# Patient Record
Sex: Male | Born: 1945
Health system: Southern US, Community
[De-identification: ages and names within clinical notes are randomized; demographics above are authoritative.]

## PROBLEM LIST (undated history)

## (undated) DIAGNOSIS — F32A Depression, unspecified: Secondary | ICD-10-CM

## (undated) DIAGNOSIS — K5790 Diverticulosis of intestine, part unspecified, without perforation or abscess without bleeding: Secondary | ICD-10-CM

## (undated) DIAGNOSIS — I6529 Occlusion and stenosis of unspecified carotid artery: Secondary | ICD-10-CM

## (undated) DIAGNOSIS — F329 Major depressive disorder, single episode, unspecified: Secondary | ICD-10-CM

## (undated) DIAGNOSIS — Z9081 Acquired absence of spleen: Secondary | ICD-10-CM

## (undated) DIAGNOSIS — N200 Calculus of kidney: Secondary | ICD-10-CM

## (undated) DIAGNOSIS — Z972 Presence of dental prosthetic device (complete) (partial): Secondary | ICD-10-CM

## (undated) DIAGNOSIS — S68119A Complete traumatic metacarpophalangeal amputation of unspecified finger, initial encounter: Secondary | ICD-10-CM

## (undated) DIAGNOSIS — M4800 Spinal stenosis, site unspecified: Secondary | ICD-10-CM

## (undated) DIAGNOSIS — M48061 Spinal stenosis, lumbar region without neurogenic claudication: Secondary | ICD-10-CM

## (undated) DIAGNOSIS — C801 Malignant (primary) neoplasm, unspecified: Secondary | ICD-10-CM

## (undated) DIAGNOSIS — H269 Unspecified cataract: Secondary | ICD-10-CM

## (undated) DIAGNOSIS — N529 Male erectile dysfunction, unspecified: Secondary | ICD-10-CM

## (undated) DIAGNOSIS — E785 Hyperlipidemia, unspecified: Secondary | ICD-10-CM

## (undated) DIAGNOSIS — K259 Gastric ulcer, unspecified as acute or chronic, without hemorrhage or perforation: Secondary | ICD-10-CM

## (undated) DIAGNOSIS — I700 Atherosclerosis of aorta: Secondary | ICD-10-CM

## (undated) DIAGNOSIS — Z87442 Personal history of urinary calculi: Secondary | ICD-10-CM

## (undated) DIAGNOSIS — K219 Gastro-esophageal reflux disease without esophagitis: Secondary | ICD-10-CM

## (undated) DIAGNOSIS — M1990 Unspecified osteoarthritis, unspecified site: Secondary | ICD-10-CM

## (undated) DIAGNOSIS — N393 Stress incontinence (female) (male): Secondary | ICD-10-CM

## (undated) DIAGNOSIS — K760 Fatty (change of) liver, not elsewhere classified: Secondary | ICD-10-CM

## (undated) HISTORY — DX: Calculus of kidney: N20.0

## (undated) HISTORY — DX: Atherosclerosis of aorta: I70.0

## (undated) HISTORY — PX: REPAIR OF PERFORATED ULCER: SHX6065

## (undated) HISTORY — DX: Diverticulosis of intestine, part unspecified, without perforation or abscess without bleeding: K57.90

## (undated) HISTORY — DX: Malignant (primary) neoplasm, unspecified: C80.1

## (undated) HISTORY — DX: Gastric ulcer, unspecified as acute or chronic, without hemorrhage or perforation: K25.9

## (undated) HISTORY — DX: Male erectile dysfunction, unspecified: N52.9

## (undated) HISTORY — DX: Stress incontinence (female) (male): N39.3

## (undated) HISTORY — DX: Spinal stenosis, lumbar region without neurogenic claudication: M48.061

## (undated) HISTORY — DX: Gastro-esophageal reflux disease without esophagitis: K21.9

## (undated) HISTORY — DX: Complete traumatic metacarpophalangeal amputation of unspecified finger, initial encounter: S68.119A

## (undated) HISTORY — DX: Occlusion and stenosis of unspecified carotid artery: I65.29

## (undated) HISTORY — DX: Acquired absence of spleen: Z90.81

## (undated) HISTORY — DX: Major depressive disorder, single episode, unspecified: F32.9

## (undated) HISTORY — DX: Depression, unspecified: F32.A

## (undated) HISTORY — DX: Hyperlipidemia, unspecified: E78.5

## (undated) HISTORY — PX: INCONTINENCE SURGERY: SHX676

## (undated) HISTORY — DX: Unspecified osteoarthritis, unspecified site: M19.90

## (undated) HISTORY — PX: COLONOSCOPY: SHX5424

## (undated) HISTORY — DX: Spinal stenosis, site unspecified: M48.00

## (undated) HISTORY — PX: VENTRAL HERNIA REPAIR: SHX424

## (undated) HISTORY — DX: Unspecified cataract: H26.9

## (undated) HISTORY — DX: Fatty (change of) liver, not elsewhere classified: K76.0

---
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
## 1967-04-13 HISTORY — PX: KNEE ARTHROSCOPY: SUR90

## 2004-04-12 DIAGNOSIS — C801 Malignant (primary) neoplasm, unspecified: Secondary | ICD-10-CM

## 2004-04-12 HISTORY — DX: Malignant (primary) neoplasm, unspecified: C80.1

## 2005-04-12 HISTORY — PX: PROSTATECTOMY: SHX69

## 2011-08-19 LAB — HM COLONOSCOPY

## 2011-08-30 LAB — HM COLONOSCOPY: HM Colonoscopy: NORMAL

## 2014-06-05 ENCOUNTER — Ambulatory Visit (INDEPENDENT_AMBULATORY_CARE_PROVIDER_SITE_OTHER): Payer: Self-pay | Admitting: Internal Medicine

## 2014-06-05 ENCOUNTER — Encounter (INDEPENDENT_AMBULATORY_CARE_PROVIDER_SITE_OTHER): Payer: Self-pay

## 2014-06-05 ENCOUNTER — Encounter: Payer: Self-pay | Admitting: Internal Medicine

## 2014-06-05 VITALS — BP 146/81 | HR 68 | Temp 97.6°F | Ht 66.5 in | Wt 200.5 lb

## 2014-06-05 DIAGNOSIS — E785 Hyperlipidemia, unspecified: Secondary | ICD-10-CM | POA: Insufficient documentation

## 2014-06-05 DIAGNOSIS — Z9081 Acquired absence of spleen: Secondary | ICD-10-CM | POA: Insufficient documentation

## 2014-06-05 DIAGNOSIS — L309 Dermatitis, unspecified: Secondary | ICD-10-CM

## 2014-06-05 DIAGNOSIS — Z8546 Personal history of malignant neoplasm of prostate: Secondary | ICD-10-CM | POA: Insufficient documentation

## 2014-06-05 DIAGNOSIS — Z8619 Personal history of other infectious and parasitic diseases: Secondary | ICD-10-CM | POA: Insufficient documentation

## 2014-06-05 DIAGNOSIS — R42 Dizziness and giddiness: Secondary | ICD-10-CM

## 2014-06-05 HISTORY — DX: Personal history of malignant neoplasm of prostate: Z85.46

## 2014-06-05 HISTORY — DX: Personal history of other infectious and parasitic diseases: Z86.19

## 2014-06-05 LAB — CBC WITH DIFFERENTIAL/PLATELET
BASOS PCT: 0.4 % (ref 0.0–3.0)
Basophils Absolute: 0 10*3/uL (ref 0.0–0.1)
EOS ABS: 0.2 10*3/uL (ref 0.0–0.7)
Eosinophils Relative: 2.7 % (ref 0.0–5.0)
HCT: 44.1 % (ref 39.0–52.0)
Hemoglobin: 14.8 g/dL (ref 13.0–17.0)
LYMPHS ABS: 1.9 10*3/uL (ref 0.7–4.0)
Lymphocytes Relative: 25.5 % (ref 12.0–46.0)
MCHC: 33.5 g/dL (ref 30.0–36.0)
MCV: 89.3 fl (ref 78.0–100.0)
MONO ABS: 0.5 10*3/uL (ref 0.1–1.0)
Monocytes Relative: 6.2 % (ref 3.0–12.0)
NEUTROS PCT: 65.2 % (ref 43.0–77.0)
Neutro Abs: 4.8 10*3/uL (ref 1.4–7.7)
PLATELETS: 244 10*3/uL (ref 150.0–400.0)
RBC: 4.93 Mil/uL (ref 4.22–5.81)
RDW: 13.6 % (ref 11.5–15.5)
WBC: 7.3 10*3/uL (ref 4.0–10.5)

## 2014-06-05 LAB — COMPREHENSIVE METABOLIC PANEL
ALT: 14 U/L (ref 0–53)
AST: 18 U/L (ref 0–37)
Albumin: 4.3 g/dL (ref 3.5–5.2)
Alkaline Phosphatase: 92 U/L (ref 39–117)
BILIRUBIN TOTAL: 0.9 mg/dL (ref 0.2–1.2)
BUN: 17 mg/dL (ref 6–23)
CALCIUM: 9.7 mg/dL (ref 8.4–10.5)
CHLORIDE: 106 meq/L (ref 96–112)
CO2: 27 mEq/L (ref 19–32)
CREATININE: 0.77 mg/dL (ref 0.40–1.50)
GFR: 106.43 mL/min (ref >60.00)
GLUCOSE: 91 mg/dL (ref 70–99)
Potassium: 4.4 mEq/L (ref 3.5–5.1)
Sodium: 137 mEq/L (ref 135–145)
TOTAL PROTEIN: 7.1 g/dL (ref 6.0–8.3)

## 2014-06-05 LAB — LIPID PANEL
CHOL/HDL RATIO: 4
Cholesterol: 181 mg/dL (ref 0–200)
HDL: 49.4 mg/dL (ref >39.00)
LDL Cholesterol: 115 mg/dL — ABNORMAL HIGH (ref 0–99)
NonHDL: 131.6
Triglycerides: 81 mg/dL (ref 0.0–149.0)
VLDL: 16.2 mg/dL (ref 0.0–40.0)

## 2014-06-05 LAB — TSH: TSH: 0.69 u[IU]/mL (ref 0.35–4.50)

## 2014-06-05 LAB — VITAMIN B12: Vitamin B-12: 524 pg/mL (ref 211–911)

## 2014-06-05 NOTE — Assessment & Plan Note (Signed)
Will request records from previous urologist. Will set up evaluation with [facility] for ongoing care.

## 2014-06-05 NOTE — Progress Notes (Signed)
Subjective:    Patient ID: Benjamin Mills, male    DOB: Sep 17, 1945, 69 y.o.   MRN: [id number]  HPI  69YO male presents to establish care. Recently moved from [place].  Prostate cancer - Followed by urology previous in [place]. S/p prostatectomy and bladder sling. No current concerns.  Last year, developed lightheadedness. Evaluated and started on Meclizine. Continues to have lightheadedness. Occasionally, loses balance because of lightheadedness.  Described as "numbness."  No chest pain. Occasional shortness of breath.  Hyperlipidemia - Prefers to be off medication. Stopped Simvastatin about 2 weeks ago. Would like to stay off if possible.  Past medical, surgical, family and social history per today's encounter.  Review of Systems  Constitutional: Negative for fever, chills, activity change, appetite change, fatigue and unexpected weight change.  Eyes: Negative for visual disturbance.  Respiratory: Positive for shortness of breath. Negative for cough and chest tightness.   Cardiovascular: Negative for chest pain, palpitations and leg swelling.  Gastrointestinal: Negative for nausea, vomiting, abdominal pain, diarrhea, constipation and abdominal distention.  Genitourinary: Negative for dysuria, urgency and difficulty urinating.  Musculoskeletal: Negative for myalgias, arthralgias, gait problem and neck stiffness.  Skin: Negative for color change and rash.  Neurological: Positive for dizziness and light-headedness. Negative for tremors, syncope, weakness, numbness and headaches.  Hematological: Negative for adenopathy.  Psychiatric/Behavioral: Negative for sleep disturbance and dysphoric mood. The patient is not nervous/anxious.        Objective:    BP 146/81 mmHg  Pulse 68  Temp(Src) 97.6 F (36.4 C) (Oral)  Ht 5' 6.5" (1.689 m)  Wt 200 lb 8 oz (90.946 kg)  BMI 31.88 kg/m2  SpO2 97% Physical Exam  Constitutional: He is oriented to person, place, and time. He appears  well-developed and well-nourished. No distress.  HENT:  Head: Normocephalic and atraumatic.  Right Ear: External ear normal.  Left Ear: External ear normal.  Nose: Nose normal.  Mouth/Throat: Oropharynx is clear and moist. No oropharyngeal exudate.  Eyes: Conjunctivae and EOM are normal. Pupils are equal, round, and reactive to light. Right eye exhibits no discharge. Left eye exhibits no discharge. No scleral icterus.  Neck: Normal range of motion. Neck supple. No tracheal deviation present. No thyromegaly present.  Cardiovascular: Normal rate, regular rhythm and normal heart sounds.  Exam reveals no gallop and no friction rub.   No murmur heard. Pulmonary/Chest: Effort normal and breath sounds normal. No respiratory distress. He has no wheezes. He has no rales. He exhibits no tenderness.  Abdominal: Soft. Bowel sounds are normal. He exhibits no distension and no mass. There is no tenderness. There is no rebound and no guarding.    Musculoskeletal: Normal range of motion. He exhibits no edema.  Lymphadenopathy:    He has no cervical adenopathy.  Neurological: He is alert and oriented to person, place, and time. No cranial nerve deficit. Coordination normal.  Skin: Skin is warm and dry. No rash noted. He is not diaphoretic. No erythema. No pallor.  Psychiatric: He has a normal mood and affect. His behavior is normal. Judgment and thought content normal.          Assessment & Plan:   Problem List Items Addressed This Visit      Unprioritized   Dermatitis    Chronic dermatitis over right anterior lower leg. Using Betamethasone with improvement. Will continue.      H/O cold sores    Recurrent cold sores. Will continue prn Valtrex.      History of prostate cancer  Will request records from previous urologist. Will set up evaluation with [facility] for ongoing care.      Relevant Orders   Ambulatory referral to Urology   History of splenectomy    S/p splenectomy.  Will request records from previous PCP regarding vaccinations.      Relevant Orders   CBC with Differential/Platelet   Hyperlipidemia    Will check lipids and LFTs with labs. He has been off Simvastatin for 2 weeks. Prefers not to be on medications if possible. Discussed Mediterranean style diet and exercise.      Relevant Medications   sildenafil (REVATIO) 20 MG tablet   Other Relevant Orders   Comprehensive metabolic panel   Lipid panel   Lightheadedness - Primary    Recent episodes of lightheadedness with mild dyspnea. Exam is normal today. EKG shows no acute changes. Will check labs including CBC, CMP, TSH, B12. Will set up cardiology evaluation for possible stress test and carotid dopplers. Follow up in 4 weeks and prn.      Relevant Orders   Ambulatory referral to Cardiology   TSH   B12   EKG 12-Lead (Completed)       Return in about 4 weeks (around 07/03/2014).

## 2014-06-05 NOTE — Patient Instructions (Signed)
It was nice to meet you!  Labs and EKG today.  We will set up an evaluation with cardiology to include carotid dopplers and possible stress test.  Follow up in 4 weeks.

## 2014-06-05 NOTE — Assessment & Plan Note (Signed)
Recent episodes of lightheadedness with mild dyspnea. Exam is normal today. EKG shows no acute changes. Will check labs including CBC, CMP, TSH, B12. Will set up cardiology evaluation for possible stress test and carotid dopplers. Follow up in 4 weeks and prn.

## 2014-06-05 NOTE — Assessment & Plan Note (Signed)
S/p splenectomy. Will request records from previous PCP regarding vaccinations.

## 2014-06-05 NOTE — Assessment & Plan Note (Signed)
Recurrent cold sores. Will continue prn Valtrex.

## 2014-06-05 NOTE — Assessment & Plan Note (Signed)
Chronic dermatitis over right anterior lower leg. Using Betamethasone with improvement. Will continue.

## 2014-06-05 NOTE — Assessment & Plan Note (Signed)
Will check lipids and LFTs with labs. He has been off Simvastatin for 2 weeks. Prefers not to be on medications if possible. Discussed Mediterranean style diet and exercise.

## 2014-06-10 ENCOUNTER — Encounter: Payer: Self-pay | Admitting: Internal Medicine

## 2014-06-21 ENCOUNTER — Encounter (INDEPENDENT_AMBULATORY_CARE_PROVIDER_SITE_OTHER): Payer: PPO

## 2014-06-21 ENCOUNTER — Ambulatory Visit (INDEPENDENT_AMBULATORY_CARE_PROVIDER_SITE_OTHER): Payer: PPO | Admitting: Cardiovascular Disease

## 2014-06-21 ENCOUNTER — Other Ambulatory Visit: Payer: PPO

## 2014-06-21 ENCOUNTER — Encounter: Payer: Self-pay | Admitting: Cardiovascular Disease

## 2014-06-21 VITALS — BP 132/87 | HR 68 | Ht 68.0 in | Wt 201.0 lb

## 2014-06-21 DIAGNOSIS — R079 Chest pain, unspecified: Secondary | ICD-10-CM

## 2014-06-21 DIAGNOSIS — R9431 Abnormal electrocardiogram [ECG] [EKG]: Secondary | ICD-10-CM

## 2014-06-21 DIAGNOSIS — I6523 Occlusion and stenosis of bilateral carotid arteries: Secondary | ICD-10-CM

## 2014-06-21 DIAGNOSIS — R42 Dizziness and giddiness: Secondary | ICD-10-CM

## 2014-06-21 NOTE — Patient Instructions (Signed)
Your physician has requested that you have a carotid duplex. This test is an ultrasound of the carotid arteries in your neck. It looks at blood flow through these arteries that supply the brain with blood. Allow one hour for this exam. There are no restrictions or special instructions.   Your physician has requested that you have a stress echocardiogram. For further information please visit [email or web address]. Please follow instruction sheet as given. -Eat a small meal  -take meds -wear comfortable cloths and lace up walking shoes  -no lotion on the skin   Your physician recommends that you schedule a follow-up appointment in:  As needed

## 2014-06-21 NOTE — Assessment & Plan Note (Addendum)
Most of his symptoms seem to be due to orthostatic hypotension. He is borderline orthostatic today by vital signs. He also had few episodes while he was vacuuming. I requested carotid Doppler to evaluate for possible steal syndrome. I advised him to avoid sudden standing up and to stay well-hydrated.  If cardiac evaluation is negative, consider ENT consult given the associated tinnitus.

## 2014-06-21 NOTE — Progress Notes (Signed)
Primary care physician: Dr. Gilford Rile  HPI  This is a pleasant 69 year old male who was referred by Dr. Gilford Rile for evaluation of dizziness and shortness of breath. He has no previous cardiac history. He recently moved from [place]. Overall, he has been healthy throughout his life and has no history of hypertension, diabetes or tobacco use. There is no family history of coronary artery disease. He has been complaining of dizziness mostly upon standing up or changing position. This started about one year ago and has been getting worse to the point of presyncope. He has not had any full syncopal episodes. He occasionally has ringing noise in both years associated with these episodes. He had few episodes of left-sided chest pain when working and they are which he attributed to a musculoskeletal etiology. He also describes chronic exertional dyspnea with slight worsening over the last 6 months.  No Known Allergies   Current Outpatient Prescriptions on File Prior to Visit  Medication Sig Dispense Refill  . betamethasone dipropionate (DIPROLENE) 0.05 % cream Apply topically as needed.     . meclizine (ANTIVERT) 25 MG tablet Take 25 mg by mouth 3 (three) times daily as needed for dizziness.    . Multiple Vitamin (MULTIVITAMIN) capsule Take 1 capsule by mouth daily.    . sildenafil (REVATIO) 20 MG tablet Take 20 mg by mouth 3 (three) times daily as needed.    . valACYclovir (VALTREX) 1000 MG tablet Take 1,000 mg by mouth as needed.      No current facility-administered medications on file prior to visit.     Past Medical History  Diagnosis Date  . Cancer     Prostate cancer   . History of splenectomy   . Hyperlipidemia   . Gastric ulcer      Past Surgical History  Procedure Laterality Date  . Splenectomy  1953    after trauma, fell out tree  . Knee arthroscopy N/A 1969    meniscal tear  . Prostatectomy  2007    removal of prostate  . Ventral hernia repair    . Incontinence surgery         Family History  Problem Relation Age of Onset  . Dementia Mother   . Alcohol abuse Father   . Glaucoma Sister   . Heart disease Brother     heart valve issue     History   Social History  . Marital Status: Unknown    Spouse Name: N/A  . Number of Children: N/A  . Years of Education: N/A   Occupational History  . Not on file.   Social History Main Topics  . Smoking status: Former Smoker    Quit date: 06/05/1978  . Smokeless tobacco: Not on file  . Alcohol Use: No  . Drug Use: No  . Sexual Activity: Not on file   Other Topics Concern  . Not on file   Social History Narrative   Born in [place].   Lived in [place].   Moved to [place], brother lives in [place].      Has 2 brothers and 1 sister.      Lives with wife in [place]. Has 3 daughters.      Work - retired, Surveyor, quantity      Diet - regular      Exercise - walks occasionally     ROS A 10 point review of system was performed. It is negative other than that mentioned in the history of present illness.  PHYSICAL EXAM   BP 132/87 mmHg  Pulse 68  Ht 5\' 8"  (1.727 m)  Wt 201 lb (91.173 kg)  BMI 30.57 kg/m2 Constitutional: He is oriented to person, place, and time. He appears well-developed and well-nourished. No distress.  HENT: No nasal discharge.  Head: Normocephalic and atraumatic.  Eyes: Pupils are equal and round.  No discharge. Neck: Normal range of motion. Neck supple. No JVD present. No thyromegaly present.  Cardiovascular: Normal rate, regular rhythm, normal heart sounds. Exam reveals no gallop and no friction rub. No murmur heard.  Pulmonary/Chest: Effort normal and breath sounds normal. No stridor. No respiratory distress. He has no wheezes. He has no rales. He exhibits no tenderness.  Abdominal: Soft. Bowel sounds are normal. He exhibits no distension. There is no tenderness. There is no rebound and no guarding.  Musculoskeletal: Normal range of motion. He exhibits no edema and no  tenderness.  Neurological: He is alert and oriented to person, place, and time. Coordination normal.  Skin: Skin is warm and dry. No rash noted. He is not diaphoretic. No erythema. No pallor.  Psychiatric: He has a normal mood and affect. His behavior is normal. Judgment and thought content normal.       EKG: Sinus  Rhythm  Diffuse low voltage.   -Incomplete right bundle branch block.   ABNORMAL     ASSESSMENT AND PLAN

## 2014-06-21 NOTE — Assessment & Plan Note (Signed)
The patient had few episodes of left-sided chest pain while working in the yard. He also complains of increased exertional dyspnea and exertional dizziness. I recommend evaluation with a stress echocardiogram.

## 2014-07-03 ENCOUNTER — Encounter: Payer: Self-pay | Admitting: Internal Medicine

## 2014-07-03 ENCOUNTER — Ambulatory Visit (INDEPENDENT_AMBULATORY_CARE_PROVIDER_SITE_OTHER): Payer: PPO | Admitting: Internal Medicine

## 2014-07-03 VITALS — BP 119/78 | HR 65 | Temp 97.7°F | Ht 66.5 in | Wt 201.5 lb

## 2014-07-03 DIAGNOSIS — Z8546 Personal history of malignant neoplasm of prostate: Secondary | ICD-10-CM | POA: Diagnosis not present

## 2014-07-03 DIAGNOSIS — R300 Dysuria: Secondary | ICD-10-CM

## 2014-07-03 DIAGNOSIS — R42 Dizziness and giddiness: Secondary | ICD-10-CM | POA: Diagnosis not present

## 2014-07-03 LAB — POCT URINALYSIS DIPSTICK
Bilirubin, UA: NEGATIVE
Glucose, UA: NEGATIVE
Ketones, UA: NEGATIVE
LEUKOCYTES UA: NEGATIVE
NITRITE UA: NEGATIVE
PH UA: 7
Protein, UA: NEGATIVE
SPEC GRAV UA: 1.02
UROBILINOGEN UA: 0.2

## 2014-07-03 LAB — PSA, MEDICARE: PSA: 0.11 ng/mL (ref 0.10–4.00)

## 2014-07-03 NOTE — Assessment & Plan Note (Signed)
Will check PSA with labs today. Urology evaluation pending.

## 2014-07-03 NOTE — Assessment & Plan Note (Signed)
Recent episode of dysuria and passing a piece of plastic material. Will try to expedite eval with urology. Question if this piece of plastic may have been from previous bladder sling.

## 2014-07-03 NOTE — Addendum Note (Signed)
Addended by: Johnsie Cancel on: 07/03/2014 02:33 PM   Modules accepted: Orders

## 2014-07-03 NOTE — Assessment & Plan Note (Signed)
Symptoms of lightheadedness are persistent. Carotid dopplers showed mild plaque but no significant blockage. Labs were normal. Will await results of cardiac stress test. If normal, will consider ENT evaluation.

## 2014-07-03 NOTE — Patient Instructions (Signed)
Labs today.  Follow up after stress test.

## 2014-07-03 NOTE — Progress Notes (Signed)
Subjective:    Patient ID: Benjamin Mills, male    DOB: 1946-01-12, 69 y.o.   MRN: [id number]  HPI 69YO male presents for follow up.  Last seen 2/24. Evaluated for lightheadedness. Seen by Cardiology 3/11.   Continues to have some lightheadedness off and on.  Has some dyspnea with exertion, but no chest pain.  Dysuria - had burning with urination last week, then piece of plastic came out of urethra. Symptoms have resolved after passing plastic.  Past medical, surgical, family and social history per today's encounter.  Review of Systems  Constitutional: Negative for fever, chills, activity change, appetite change, fatigue and unexpected weight change.  Eyes: Negative for visual disturbance.  Respiratory: Positive for shortness of breath. Negative for cough.   Cardiovascular: Negative for chest pain, palpitations and leg swelling.  Gastrointestinal: Negative for abdominal pain, diarrhea, constipation and abdominal distention.  Genitourinary: Positive for dysuria, discharge (plastic piece of material) and penile pain. Negative for urgency, hematuria and difficulty urinating.  Musculoskeletal: Negative for arthralgias and gait problem.  Skin: Negative for color change and rash.  Neurological: Positive for light-headedness. Negative for seizures, facial asymmetry and weakness.  Hematological: Negative for adenopathy.  Psychiatric/Behavioral: Negative for sleep disturbance and dysphoric mood. The patient is not nervous/anxious.        Objective:    BP 119/78 mmHg  Pulse 65  Temp(Src) 97.7 F (36.5 C) (Oral)  Ht 5' 6.5" (1.689 m)  Wt 201 lb 8 oz (91.4 kg)  BMI 32.04 kg/m2  SpO2 99% Physical Exam  Constitutional: He is oriented to person, place, and time. He appears well-developed and well-nourished. No distress.  HENT:  Head: Normocephalic and atraumatic.  Right Ear: External ear normal.  Left Ear: External ear normal.  Nose: Nose normal.  Mouth/Throat: Oropharynx is  clear and moist. No oropharyngeal exudate.  Eyes: Conjunctivae and EOM are normal. Pupils are equal, round, and reactive to light. Right eye exhibits no discharge. Left eye exhibits no discharge. No scleral icterus.  Neck: Normal range of motion. Neck supple. No tracheal deviation present. No thyromegaly present.  Cardiovascular: Normal rate, regular rhythm and normal heart sounds.  Exam reveals no gallop and no friction rub.   No murmur heard. Pulmonary/Chest: Effort normal and breath sounds normal. No accessory muscle usage. No tachypnea. No respiratory distress. He has no decreased breath sounds. He has no wheezes. He has no rhonchi. He has no rales. He exhibits no tenderness.  Musculoskeletal: Normal range of motion. He exhibits no edema.  Lymphadenopathy:    He has no cervical adenopathy.  Neurological: He is alert and oriented to person, place, and time. No cranial nerve deficit. Coordination normal.  Skin: Skin is warm and dry. No rash noted. He is not diaphoretic. No erythema. No pallor.  Psychiatric: He has a normal mood and affect. His behavior is normal. Judgment and thought content normal.          Assessment & Plan:   Problem List Items Addressed This Visit      Unprioritized   Dysuria    Recent episode of dysuria and passing a piece of plastic material. Will try to expedite eval with urology. Question if this piece of plastic may have been from previous bladder sling.      Relevant Orders   POCT Urinalysis Dipstick   History of prostate cancer    Will check PSA with labs today. Urology evaluation pending.      Relevant Orders   PSA, Medicare  Lightheadedness - Primary    Symptoms of lightheadedness are persistent. Carotid dopplers showed mild plaque but no significant blockage. Labs were normal. Will await results of cardiac stress test. If normal, will consider ENT evaluation.          Return in about 5 weeks (around 08/07/2014) for Recheck.

## 2014-07-03 NOTE — Progress Notes (Signed)
Pre visit review using our clinic review tool, if applicable. No additional management support is needed unless otherwise documented below in the visit note. 

## 2014-07-05 ENCOUNTER — Encounter: Payer: Self-pay | Admitting: Internal Medicine

## 2014-07-05 DIAGNOSIS — H9313 Tinnitus, bilateral: Secondary | ICD-10-CM

## 2014-07-05 LAB — CULTURE, URINE COMPREHENSIVE
COLONY COUNT: NO GROWTH
ORGANISM ID, BACTERIA: NO GROWTH

## 2014-07-10 ENCOUNTER — Encounter: Payer: Self-pay | Admitting: Internal Medicine

## 2014-07-25 ENCOUNTER — Other Ambulatory Visit: Payer: PPO

## 2014-07-26 ENCOUNTER — Encounter: Payer: Self-pay | Admitting: Internal Medicine

## 2014-07-29 ENCOUNTER — Ambulatory Visit (INDEPENDENT_AMBULATORY_CARE_PROVIDER_SITE_OTHER): Payer: PPO

## 2014-07-29 DIAGNOSIS — R079 Chest pain, unspecified: Secondary | ICD-10-CM

## 2014-07-29 DIAGNOSIS — R9431 Abnormal electrocardiogram [ECG] [EKG]: Secondary | ICD-10-CM

## 2014-07-30 ENCOUNTER — Telehealth: Payer: Self-pay

## 2014-07-30 NOTE — Telephone Encounter (Signed)
Pt would like stress test results.  

## 2014-07-31 ENCOUNTER — Telehealth: Payer: Self-pay | Admitting: *Deleted

## 2014-07-31 NOTE — Telephone Encounter (Signed)
Patient came by for stress test results.

## 2014-08-01 NOTE — Telephone Encounter (Signed)
Please make sure he has a 73min follow up visit in the next few weeks. Thanks

## 2014-08-01 NOTE — Telephone Encounter (Signed)
Reviewed results w/ pt.   He is appreciative and would like to make Dr. Gilford Rile aware so that she can proceed w/ additional testing.

## 2014-08-01 NOTE — Telephone Encounter (Signed)
Normal stress test. Not sure why the result was not routed to me.

## 2014-08-02 ENCOUNTER — Encounter: Payer: Self-pay | Admitting: Internal Medicine

## 2014-08-06 ENCOUNTER — Ambulatory Visit (INDEPENDENT_AMBULATORY_CARE_PROVIDER_SITE_OTHER): Payer: PPO | Admitting: Internal Medicine

## 2014-08-06 ENCOUNTER — Encounter: Payer: Self-pay | Admitting: Internal Medicine

## 2014-08-06 VITALS — BP 118/78 | HR 72 | Temp 97.9°F | Resp 14 | Ht 66.5 in | Wt 205.8 lb

## 2014-08-06 DIAGNOSIS — F4323 Adjustment disorder with mixed anxiety and depressed mood: Secondary | ICD-10-CM

## 2014-08-06 DIAGNOSIS — R42 Dizziness and giddiness: Secondary | ICD-10-CM

## 2014-08-06 HISTORY — DX: Adjustment disorder with mixed anxiety and depressed mood: F43.23

## 2014-08-06 MED ORDER — DULOXETINE HCL 30 MG PO CPEP: 30.0000 mg | ORAL_CAPSULE | Freq: Every day | ORAL | Status: DC

## 2014-08-06 NOTE — Progress Notes (Signed)
Pre visit review using our clinic review tool, if applicable. No additional management support is needed unless otherwise documented below in the visit note. 

## 2014-08-06 NOTE — Assessment & Plan Note (Signed)
Persistent lightheadedness with headache. Cardiology and ENT evaluation normal. Will set up MRI brain for further evaluation.

## 2014-08-06 NOTE — Progress Notes (Signed)
Subjective:    Patient ID: Benjamin Mills, male    DOB: 06-24-1945, 69 y.o.   MRN: [id number]  HPI  69YO male presents for follow up.  Dizziness - Had evaluation with cardiology, including stress test and carotid dopplers which were normal. Also seen by ENT and this was normal. Noted mild hearing loss.  Continues to have lightheadedness every day. Some episodes worse than others. Some episodes with ears ringing. Each episode lasts for 30-53min. Improves without interventions. Some improvement with lying down. Some nausea but no vomiting. Occasional diffuse headache. Improved with aspirin.  Notes some increased depressed mood and anxiety recently. Some marital issues. In the past, used Cymbalta and had significant improvement with this.  Past medical, surgical, family and social history per today's encounter.  Review of Systems  Constitutional: Negative for fever, chills, activity change, appetite change, fatigue and unexpected weight change.  HENT: Positive for hearing loss. Negative for congestion.   Eyes: Negative for visual disturbance.  Respiratory: Negative for cough and shortness of breath.   Cardiovascular: Negative for chest pain, palpitations and leg swelling.  Gastrointestinal: Positive for nausea. Negative for vomiting, abdominal pain, diarrhea, constipation and abdominal distention.  Genitourinary: Negative for dysuria, urgency and difficulty urinating.  Musculoskeletal: Negative for arthralgias and gait problem.  Skin: Negative for color change and rash.  Neurological: Positive for dizziness, light-headedness and headaches. Negative for tremors, seizures, syncope, speech difficulty and weakness.  Hematological: Negative for adenopathy.  Psychiatric/Behavioral: Positive for dysphoric mood. Negative for suicidal ideas and sleep disturbance. The patient is nervous/anxious.        Objective:    BP 118/78 mmHg  Pulse 72  Temp(Src) 97.9 F (36.6 C) (Oral)  Resp 14   Ht 5' 6.5" (1.689 m)  Wt 205 lb 12.8 oz (93.35 kg)  BMI 32.72 kg/m2  SpO2 97% Physical Exam  Constitutional: He is oriented to person, place, and time. He appears well-developed and well-nourished. No distress.  HENT:  Head: Normocephalic and atraumatic.  Right Ear: External ear normal.  Left Ear: External ear normal.  Nose: Nose normal.  Mouth/Throat: Oropharynx is clear and moist. No oropharyngeal exudate.  Eyes: Conjunctivae and EOM are normal. Pupils are equal, round, and reactive to light. Right eye exhibits no discharge. Left eye exhibits no discharge. No scleral icterus.  Neck: Normal range of motion. Neck supple. No tracheal deviation present. No thyromegaly present.  Cardiovascular: Normal rate, regular rhythm and normal heart sounds.  Exam reveals no gallop and no friction rub.   No murmur heard. Pulmonary/Chest: Effort normal and breath sounds normal. No accessory muscle usage. No tachypnea. No respiratory distress. He has no decreased breath sounds. He has no wheezes. He has no rhonchi. He has no rales. He exhibits no tenderness.  Musculoskeletal: Normal range of motion. He exhibits no edema.  Lymphadenopathy:    He has no cervical adenopathy.  Neurological: He is alert and oriented to person, place, and time. No cranial nerve deficit. Coordination normal.  Skin: Skin is warm and dry. No rash noted. He is not diaphoretic. No erythema. No pallor.  Psychiatric: His speech is normal and behavior is normal. Judgment and thought content normal. His mood appears anxious. He exhibits a depressed mood. He expresses no suicidal ideation.          Assessment & Plan:   Problem List Items Addressed This Visit      Unprioritized   Adjustment disorder with mixed anxiety and depressed mood    Recent worsening  anxiety and depression. Will start Cymbalta as this worked well for him in the past. Follow up in 4 weeks and prn.      Relevant Medications   DULoxetine (CYMBALTA) 30 MG  capsule   Lightheadedness - Primary    Persistent lightheadedness with headache. Cardiology and ENT evaluation normal. Will set up MRI brain for further evaluation.       Relevant Orders   MR Brain W Wo Contrast       Return in about 4 weeks (around 09/03/2014) for Recheck.

## 2014-08-06 NOTE — Assessment & Plan Note (Signed)
Recent worsening anxiety and depression. Will start Cymbalta as this worked well for him in the past. Follow up in 4 weeks and prn.

## 2014-08-06 NOTE — Patient Instructions (Signed)
We will set up MRI brain.  Start Cymbalta 30mg  daily.  Follow up in 4 weeks.

## 2014-08-15 ENCOUNTER — Ambulatory Visit
Admission: RE | Admit: 2014-08-15 | Discharge: 2014-08-15 | Disposition: A | Discharge status: Home / Self Care | Payer: PPO | Source: Ambulatory Visit | Attending: Internal Medicine | Admitting: Internal Medicine

## 2014-08-15 DIAGNOSIS — G319 Degenerative disease of nervous system, unspecified: Secondary | ICD-10-CM | POA: Insufficient documentation

## 2014-08-15 DIAGNOSIS — R42 Dizziness and giddiness: Secondary | ICD-10-CM | POA: Diagnosis present

## 2014-08-15 MED ORDER — GADOBENATE DIMEGLUMINE 529 MG/ML IV SOLN
20.0000 mL | Freq: Once | INTRAVENOUS | Status: AC | PRN
  Administered 2014-08-15: 20 mL via INTRAVENOUS

## 2014-09-03 ENCOUNTER — Ambulatory Visit: Payer: PPO | Admitting: Internal Medicine

## 2014-09-04 ENCOUNTER — Encounter: Payer: Self-pay | Admitting: Internal Medicine

## 2014-09-04 ENCOUNTER — Ambulatory Visit (INDEPENDENT_AMBULATORY_CARE_PROVIDER_SITE_OTHER): Payer: PPO | Admitting: Internal Medicine

## 2014-09-04 VITALS — BP 118/76 | HR 83 | Temp 97.7°F | Ht 66.5 in | Wt 209.1 lb

## 2014-09-04 DIAGNOSIS — R42 Dizziness and giddiness: Secondary | ICD-10-CM | POA: Diagnosis not present

## 2014-09-04 DIAGNOSIS — Z8546 Personal history of malignant neoplasm of prostate: Secondary | ICD-10-CM | POA: Diagnosis not present

## 2014-09-04 DIAGNOSIS — F4323 Adjustment disorder with mixed anxiety and depressed mood: Secondary | ICD-10-CM

## 2014-09-04 MED ORDER — DULOXETINE HCL 60 MG PO CPEP: 60.0000 mg | ORAL_CAPSULE | Freq: Every day | ORAL | Status: DC

## 2014-09-04 NOTE — Patient Instructions (Signed)
Increase Cymbalta to 60mg  daily.  Follow up in 6 months and sooner as needed.

## 2014-09-04 NOTE — Assessment & Plan Note (Signed)
Symptoms have nearly resolved. Cardiac and ENT workup normal. MRI brain showed no acute findings. Will continue to monitor.

## 2014-09-04 NOTE — Assessment & Plan Note (Signed)
Recent PSA was 0.1. Reviewed notes from urology. Plan for repeat PSA in 6 months.

## 2014-09-04 NOTE — Assessment & Plan Note (Signed)
Symptoms of depression much improved on Cymbalta. Will try increase to Cymbalta 60mg  daily. Follow up in 4 weeks and prn.

## 2014-09-04 NOTE — Progress Notes (Signed)
Pre visit review using our clinic review tool, if applicable. No additional management support is needed unless otherwise documented below in the visit note. 

## 2014-09-04 NOTE — Progress Notes (Signed)
Subjective:    Patient ID: Benjamin Mills, male    DOB: [birth year], 69 y.o.   MRN: [id number]  HPI  69YO male presents for follow up.  Last seen 4/26 for lightheadedness. Cardiac and ENT eval were normal. MRI brain showed no acute findings. Also started on Cymbalta to help with symptoms of depression. Symptoms have improved. Episodes of depressed mood are less. He would like to consider increase in dose if possible. No adverse side effects noted from medication.   Past medical, surgical, family and social history per today's encounter.  Review of Systems  Constitutional: Negative for fever, chills, activity change, appetite change, fatigue and unexpected weight change.  Eyes: Negative for visual disturbance.  Respiratory: Negative for cough and shortness of breath.   Cardiovascular: Negative for chest pain, palpitations and leg swelling.  Gastrointestinal: Negative for abdominal pain, diarrhea, constipation and abdominal distention.  Genitourinary: Negative for dysuria, urgency and difficulty urinating.  Musculoskeletal: Negative for arthralgias and gait problem.  Skin: Negative for color change and rash.  Hematological: Negative for adenopathy.  Psychiatric/Behavioral: Negative for suicidal ideas, sleep disturbance and dysphoric mood. The patient is not nervous/anxious.        Objective:    BP 118/76 mmHg  Pulse 83  Temp(Src) 97.7 F (36.5 C) (Oral)  Ht 5' 6.5" (1.689 m)  Wt 209 lb 2 oz (94.858 kg)  BMI 33.25 kg/m2  SpO2 95% Physical Exam  Constitutional: He is oriented to person, place, and time. He appears well-developed and well-nourished. No distress.  HENT:  Head: Normocephalic and atraumatic.  Right Ear: External ear normal.  Left Ear: External ear normal.  Nose: Nose normal.  Mouth/Throat: Oropharynx is clear and moist. No oropharyngeal exudate.  Eyes: Conjunctivae and EOM are normal. Pupils are equal, round, and reactive to light. Right eye exhibits no  discharge. Left eye exhibits no discharge. No scleral icterus.  Neck: Normal range of motion. Neck supple. No tracheal deviation present. No thyromegaly present.  Cardiovascular: Normal rate, regular rhythm and normal heart sounds.  Exam reveals no gallop and no friction rub.   No murmur heard. Pulmonary/Chest: Effort normal and breath sounds normal. No accessory muscle usage. No tachypnea. No respiratory distress. He has no decreased breath sounds. He has no wheezes. He has no rhonchi. He has no rales. He exhibits no tenderness.  Musculoskeletal: Normal range of motion. He exhibits no edema.  Lymphadenopathy:    He has no cervical adenopathy.  Neurological: He is alert and oriented to person, place, and time. No cranial nerve deficit. Coordination normal.  Skin: Skin is warm and dry. No rash noted. He is not diaphoretic. No erythema. No pallor.  Psychiatric: He has a normal mood and affect. His behavior is normal. Judgment and thought content normal.          Assessment & Plan:   Problem List Items Addressed This Visit      Unprioritized   Adjustment disorder with mixed anxiety and depressed mood    Symptoms of depression much improved on Cymbalta. Will try increase to Cymbalta 60mg  daily. Follow up in 4 weeks and prn.      Relevant Medications   DULoxetine (CYMBALTA) 60 MG capsule   History of prostate cancer - Primary    Recent PSA was 0.1. Reviewed notes from urology. Plan for repeat PSA in 6 months.      Lightheadedness    Symptoms have nearly resolved. Cardiac and ENT workup normal. MRI brain showed no acute findings.  Will continue to monitor.          Return in about 4 weeks (around 10/02/2014) for Recheck.

## 2014-10-03 ENCOUNTER — Ambulatory Visit (INDEPENDENT_AMBULATORY_CARE_PROVIDER_SITE_OTHER): Payer: PPO | Admitting: Internal Medicine

## 2014-10-03 ENCOUNTER — Encounter: Payer: Self-pay | Admitting: Internal Medicine

## 2014-10-03 VITALS — BP 116/77 | HR 83 | Temp 98.4°F | Ht 66.5 in | Wt 209.0 lb

## 2014-10-03 DIAGNOSIS — F4323 Adjustment disorder with mixed anxiety and depressed mood: Secondary | ICD-10-CM | POA: Diagnosis not present

## 2014-10-03 MED ORDER — VALACYCLOVIR HCL 1 G PO TABS: 1000.0000 mg | ORAL_TABLET | Freq: Two times a day (BID) | ORAL | Status: DC

## 2014-10-03 NOTE — Progress Notes (Signed)
Pre visit review using our clinic review tool, if applicable. No additional management support is needed unless otherwise documented below in the visit note. 

## 2014-10-03 NOTE — Assessment & Plan Note (Signed)
Symptoms much improved with Cymbalta 60mg  daily. Will continue. He will call or RTC if any concerns. Follow up in 6 months and prn.

## 2014-10-03 NOTE — Progress Notes (Signed)
   Subjective:    Patient ID: Benjamin Mills, male    DOB: [birth year], 69 y.o.   MRN: [id number]  HPI  69YO male presents for follow up.  Last seen 5/25. Dose of Cymbalta increased to 60mg  daily to help with anxiety and depression.  Feeling much better on higher dose. No depressed mood. Energy level improved. Started back walking. No other side effects noted. No new concerns today.   Past medical, surgical, family and social history per today's encounter.  Review of Systems  Constitutional: Negative for fever, chills, activity change, appetite change, fatigue and unexpected weight change.  Eyes: Negative for visual disturbance.  Respiratory: Negative for cough and shortness of breath.   Cardiovascular: Negative for chest pain, palpitations and leg swelling.  Gastrointestinal: Negative for nausea, vomiting, abdominal pain, diarrhea, constipation and abdominal distention.  Genitourinary: Negative for dysuria, urgency and difficulty urinating.  Musculoskeletal: Negative for arthralgias and gait problem.  Skin: Negative for color change and rash.  Hematological: Negative for adenopathy.  Psychiatric/Behavioral: Negative for suicidal ideas, sleep disturbance and dysphoric mood. The patient is not nervous/anxious.        Objective:    BP 116/77 mmHg  Pulse 83  Temp(Src) 98.4 F (36.9 C) (Oral)  Ht 5' 6.5" (1.689 m)  Wt 209 lb (94.802 kg)  BMI 33.23 kg/m2  SpO2 95% Physical Exam  Constitutional: He is oriented to person, place, and time. He appears well-developed and well-nourished. No distress.  HENT:  Head: Normocephalic and atraumatic.  Right Ear: External ear normal.  Left Ear: External ear normal.  Nose: Nose normal.  Mouth/Throat: Oropharynx is clear and moist. No oropharyngeal exudate.  Eyes: Conjunctivae and EOM are normal. Pupils are equal, round, and reactive to light. Right eye exhibits no discharge. Left eye exhibits no discharge. No scleral icterus.  Neck:  Normal range of motion. Neck supple. No tracheal deviation present. No thyromegaly present.  Cardiovascular: Normal rate, regular rhythm and normal heart sounds.  Exam reveals no gallop and no friction rub.   No murmur heard. Pulmonary/Chest: Effort normal and breath sounds normal. No accessory muscle usage. No tachypnea. No respiratory distress. He has no decreased breath sounds. He has no wheezes. He has no rhonchi. He has no rales. He exhibits no tenderness.  Musculoskeletal: Normal range of motion. He exhibits no edema.  Lymphadenopathy:    He has no cervical adenopathy.  Neurological: He is alert and oriented to person, place, and time. No cranial nerve deficit. Coordination normal.  Skin: Skin is warm and dry. No rash noted. He is not diaphoretic. No erythema. No pallor.  Psychiatric: He has a normal mood and affect. His speech is normal and behavior is normal. Judgment and thought content normal. He expresses no suicidal ideation.          Assessment & Plan:   Problem List Items Addressed This Visit      Unprioritized   Adjustment disorder with mixed anxiety and depressed mood - Primary    Symptoms much improved with Cymbalta 60mg  daily. Will continue. He will call or RTC if any concerns. Follow up in 6 months and prn.          Return in about 6 months (around 04/04/2015) for Wellness Visit.

## 2014-10-08 ENCOUNTER — Ambulatory Visit (INDEPENDENT_AMBULATORY_CARE_PROVIDER_SITE_OTHER): Payer: PPO | Admitting: Urology

## 2014-10-08 ENCOUNTER — Encounter: Payer: Self-pay | Admitting: Urology

## 2014-10-08 VITALS — BP 113/78 | HR 81 | Resp 18 | Ht 68.0 in | Wt 208.8 lb

## 2014-10-08 DIAGNOSIS — R312 Other microscopic hematuria: Secondary | ICD-10-CM

## 2014-10-08 DIAGNOSIS — N5231 Erectile dysfunction following radical prostatectomy: Secondary | ICD-10-CM | POA: Diagnosis not present

## 2014-10-08 DIAGNOSIS — Z8546 Personal history of malignant neoplasm of prostate: Secondary | ICD-10-CM

## 2014-10-08 DIAGNOSIS — N393 Stress incontinence (female) (male): Secondary | ICD-10-CM | POA: Diagnosis not present

## 2014-10-08 DIAGNOSIS — R3129 Other microscopic hematuria: Secondary | ICD-10-CM

## 2014-10-08 LAB — URINALYSIS, COMPLETE
Bilirubin, UA: NEGATIVE
Glucose, UA: NEGATIVE
Leukocytes, UA: NEGATIVE
NITRITE UA: NEGATIVE
Protein, UA: NEGATIVE
SPEC GRAV UA: 1.025 (ref 1.005–1.030)
UUROB: 0.2 mg/dL (ref 0.2–1.0)
pH, UA: 6 (ref 5.0–7.5)

## 2014-10-08 LAB — MICROSCOPIC EXAMINATION

## 2014-10-08 NOTE — Progress Notes (Addendum)
10/08/2014 9:13 AM   Benjamin Mills [birth year] [id number]  Referring provider: Jackolyn Confer, MD [address] [place], [place] [address]  Chief Complaint  Patient presents with  . Hematuria    HPI: 69 year old male with a history of prostate cancer status post robotic prostatectomy in 2007 and microscopic hematuria.  He returns today to discuss his PSA along with evaluation of possible microscopic hematuria. At last visit, he underwent cystoscopy for questional history of gross hematuria which was negative for any bladder pathology on 09/10/14.  He was diagnosed and underwent laparoscopic radical prostatectomy at [facility] in [place] back in 2007.  Pathology showed Gleason 3+4, pT2cNxMx. He's not had any adjuvant radiation.  His most recent PSA drawn by his PCP was 0.11 performed on 07/03/14. He is unsure of previous values but does think that it's always been within this range.  Her to requested and received from [facility] where he received his urologic follow-up prior to be today, however, the specific records from his urologist were not received.  He also has a history of severe erectile dysfunction following prostatectomy. Occasionally have a spontaneous erection but is not adequate for penetration or sexual intercourse. He tried Viagra once a few years ago and developed a headache and is scared to try anything since. He was also prescribed another medication but has not yet tried it due to cost.   He also has a history of stress urinary incontinence and underwent placement of a male urethral sling in 2014. Prior to placement of the sling, he leaked 3-4 pads a day but now is down to minimal. He is quite pleased with the result.      PMH: Past Medical History  Diagnosis Date  . Cancer     Prostate cancer   . History of splenectomy   . Hyperlipidemia   . Gastric ulcer     Surgical History: Past Surgical History  Procedure  Laterality Date  . Splenectomy  1953    after trauma, fell out tree  . Knee arthroscopy N/A 1969    meniscal tear  . Prostatectomy  2007    removal of prostate  . Ventral hernia repair    . Incontinence surgery      Home Medications:    Medication List       This list is accurate as of: 10/08/14  9:13 AM.  Always use your most recent med list.               betamethasone dipropionate 0.05 % cream  Commonly known as:  DIPROLENE  Apply topically as needed.     DULoxetine 60 MG capsule  Commonly known as:  CYMBALTA  Take 1 capsule (60 mg total) by mouth daily.     multivitamin capsule  Take 1 capsule by mouth daily.     sildenafil 20 MG tablet  Commonly known as:  REVATIO  Take 20 mg by mouth 3 (three) times daily as needed.     valACYclovir 1000 MG tablet  Commonly known as:  VALTREX  Take 1 tablet (1,000 mg total) by mouth 2 (two) times daily.        Allergies: No Known Allergies  Family History: Family History  Problem Relation Age of Onset  . Dementia Mother   . Alcohol abuse Father   . Glaucoma Sister   . Heart disease Brother     heart valve issue    Social History:  reports that he quit smoking  about 36 years ago. He does not have any smokeless tobacco history on file. He reports that he does not drink alcohol or use illicit drugs.  ROS: Urological Symptom Review  Patient is experiencing the following symptoms: Leakage of urine Erection problems (male only)   Review of Systems  Gastrointestinal (upper)  : Negative for upper GI symptoms  Gastrointestinal (lower) : Negative for lower GI symptoms  Constitutional : Negative for symptoms  Skin: Negative for skin symptoms  Eyes: Negative for eye symptoms  Ear/Nose/Throat : Negative for Ear/Nose/Throat symptoms  Hematologic/Lymphatic: Negative for Hematologic/Lymphatic symptoms  Cardiovascular : Negative for cardiovascular symptoms  Respiratory : Negative for respiratory  symptoms  Endocrine: Negative for endocrine symptoms  Musculoskeletal: Negative for musculoskeletal symptoms  Neurological: Negative for neurological symptoms  Psychologic: Negative for psychiatric symptoms   Physical Exam: BP 113/78 mmHg  Pulse 81  Resp 18  Ht 5\' 8"  (1.727 m)  Wt 208 lb 12.8 oz (94.711 kg)  BMI 31.76 kg/m2  Constitutional:  Alert and oriented, No acute distress. HEENT: [place] AT, moist mucus membranes.  Trachea midline, no masses. Cardiovascular: No clubbing, cyanosis, or edema. Respiratory: Normal respiratory effort, no increased work of breathing. GI: Abdomen is soft, nontender, nondistended, no abdominal masses GU: No CVA tenderness.  Skin: No rashes, bruises or suspicious lesions. Neurologic: Grossly intact, no focal deficits, moving all 4 extremities. Psychiatric: Normal mood and affect.  Laboratory Data: Lab Results  Component Value Date   WBC 7.3 06/05/2014   HGB 14.8 06/05/2014   HCT 44.1 06/05/2014   MCV 89.3 06/05/2014   PLT 244.0 06/05/2014    Lab Results  Component Value Date   CREATININE 0.77 06/05/2014    Lab Results  Component Value Date   PSA 0.11 07/03/2014   Urinalysis Results for orders placed or performed in visit on 10/08/14  Microscopic Examination  Result Value Ref Range   WBC, UA 0-5 0 -  5 /hpf   RBC, UA 3-10 (A) 0 -  2 /hpf   Epithelial Cells (non renal) 0-10 0 - 10 /hpf   Mucus, UA Present (A) Not Estab.   Bacteria, UA Few None seen/Few  Urinalysis, Complete  Result Value Ref Range   Specific Gravity, UA 1.025 1.005 - 1.030   pH, UA 6.0 5.0 - 7.5   Color, UA Yellow Yellow   Appearance Ur Clear Clear   Leukocytes, UA Negative Negative   Protein, UA Negative Negative/Trace   Glucose, UA Negative Negative   Ketones, UA Trace (A) Negative   RBC, UA 2+ (A) Negative   Bilirubin, UA Negative Negative   Urobilinogen, Ur 0.2 0.2 - 1.0 mg/dL   Nitrite, UA Negative Negative   Microscopic Examination See below:      Pertinent Imaging: n/a  Assessment & Plan:    1. Microscopic hematuria Persistent microscopic hematuria. He is status post negative cystoscopy on 09/10/2014. I have recommended completion of his workup with CT urogram per the AUA guidelines. The patient is agreeable. - Urinalysis, Complete - CT Abdomen Pelvis W Wo Contrast; Future  2. History of prostate cancer Gleason 3+4, pT2cNxMx s/p laparoscopic radical prostatectomy at [facility] in 2007. Most recent PSA 0.11 in 6 2016, unknown PSA trend since surgery, awaiting records.  He is never previously had adjuvant/ salvage radiation. Explained that I do have some concern for biochemical recurrence and most importantly would be his PSA trend. He will obtain his urologic records from [facility] for comparison. I have recommended a follow-up  PSA in 2 months to help establish a trend.    3. SUI (stress urinary incontinence), male  Mild stress urinary incontinence status post sling placed into 2014. Minimal bother  4. Erectile dysfunction following radical prostatectomy  Stable severe erectile dysfunction. Not currently desiring any intervention.    Return in about 2 months (around 12/08/2014) for for PSA, CT Urogram prior to visit.  Hollice Espy, MD  [facility] [address] [place], [place] [address] [phone]   Addendum: Additional records received from [facility].  PSA on 04/24/2012 was 0.060. Continue to await for further previous PSA values. Plan remains as above.

## 2014-10-16 ENCOUNTER — Encounter: Payer: Self-pay | Admitting: Urology

## 2014-10-17 ENCOUNTER — Ambulatory Visit
Admission: RE | Admit: 2014-10-17 | Discharge: 2014-10-17 | Disposition: A | Discharge status: Home / Self Care | Payer: PPO | Source: Ambulatory Visit | Attending: Urology | Admitting: Urology

## 2014-10-17 DIAGNOSIS — M5136 Other intervertebral disc degeneration, lumbar region: Secondary | ICD-10-CM | POA: Diagnosis not present

## 2014-10-17 DIAGNOSIS — I700 Atherosclerosis of aorta: Secondary | ICD-10-CM | POA: Insufficient documentation

## 2014-10-17 DIAGNOSIS — Z9079 Acquired absence of other genital organ(s): Secondary | ICD-10-CM | POA: Diagnosis not present

## 2014-10-17 DIAGNOSIS — R3129 Other microscopic hematuria: Secondary | ICD-10-CM

## 2014-10-17 DIAGNOSIS — N281 Cyst of kidney, acquired: Secondary | ICD-10-CM | POA: Insufficient documentation

## 2014-10-17 DIAGNOSIS — Z9081 Acquired absence of spleen: Secondary | ICD-10-CM | POA: Diagnosis not present

## 2014-10-17 DIAGNOSIS — R312 Other microscopic hematuria: Secondary | ICD-10-CM | POA: Diagnosis not present

## 2014-10-17 DIAGNOSIS — K760 Fatty (change of) liver, not elsewhere classified: Secondary | ICD-10-CM | POA: Diagnosis not present

## 2014-10-17 MED ORDER — IOHEXOL 350 MG/ML SOLN
125.0000 mL | Freq: Once | INTRAVENOUS | Status: AC | PRN
  Administered 2014-10-17: 125 mL via INTRAVENOUS

## 2014-11-18 ENCOUNTER — Encounter: Payer: Self-pay | Admitting: Internal Medicine

## 2014-11-18 ENCOUNTER — Encounter: Payer: Self-pay | Admitting: Urology

## 2014-12-02 ENCOUNTER — Encounter: Payer: Self-pay | Admitting: *Deleted

## 2014-12-10 ENCOUNTER — Encounter: Payer: Self-pay | Admitting: Urology

## 2014-12-10 ENCOUNTER — Ambulatory Visit (INDEPENDENT_AMBULATORY_CARE_PROVIDER_SITE_OTHER): Payer: PPO | Admitting: Urology

## 2014-12-10 VITALS — BP 130/84 | HR 83 | Ht 68.0 in | Wt 214.7 lb

## 2014-12-10 DIAGNOSIS — Z8546 Personal history of malignant neoplasm of prostate: Secondary | ICD-10-CM

## 2014-12-10 DIAGNOSIS — N5231 Erectile dysfunction following radical prostatectomy: Secondary | ICD-10-CM

## 2014-12-10 DIAGNOSIS — R312 Other microscopic hematuria: Secondary | ICD-10-CM | POA: Diagnosis not present

## 2014-12-10 DIAGNOSIS — N393 Stress incontinence (female) (male): Secondary | ICD-10-CM | POA: Diagnosis not present

## 2014-12-10 DIAGNOSIS — R3129 Other microscopic hematuria: Secondary | ICD-10-CM

## 2014-12-10 HISTORY — DX: Stress incontinence (female) (male): N39.3

## 2014-12-10 HISTORY — DX: Erectile dysfunction following radical prostatectomy: N52.31

## 2014-12-10 NOTE — Progress Notes (Signed)
12/10/2014 9:31 AM   Benjamin Mills 04-28-1945 [id number]  Referring provider: Jackolyn Confer, MD [address] [place], [place] [address]  Chief Complaint  Patient presents with  . Prostate Cancer    2 month follow up  . Results    CT    HPI: Patient is a 69 year old male with a history of prostate cancer status post robotic prostatectomy in 2007 and microscopic hematuria who presents today to discuss his CT urogram results and a repeat PSA.  Previous history:    He underwent cystoscopy for questionable history of gross hematuria which was negative for any bladder pathology on 09/10/14.   He was diagnosed and underwent laparoscopic radical prostatectomy at [facility] in [place] back in 2007. Pathology showed Gleason 3+4, pT2cNxMx. He's not had any adjuvant radiation.  His most recent PSA drawn by his PCP was 0.11 performed on 07/03/14. Records were C from [facility] and his PSA on 04/24/2012 was 0.060 ng/mL.    He also has a history of severe erectile dysfunction following prostatectomy. Occasionally have a spontaneous erection but is not adequate for penetration or sexual intercourse. He tried Viagra once a few years ago and developed a headache and is scared to try anything since. He was also prescribed another medication but has not yet tried it due to cost.   He also has a history of stress urinary incontinence and underwent placement of a male urethral sling in 2014. Prior to placement of the sling, he leaked 3-4 pads a day but now is down to minimal. He is quite pleased with the result.  Today, he denies any gross hematuria, dysuria or suprapubic pain. His only complaint is getting up to urinate in the evening 1-2.   I reviewed the CT images with the patient.  No GU pathology was discovered.   PMH: Past Medical History  Diagnosis Date  . History of splenectomy   . Hyperlipidemia   . Gastric ulcer   . Cancer 2006    Prostate  cancer with resection.  . Bleeding disorder   . Depression   . GERD (gastroesophageal reflux disease)   . Erectile dysfunction   . Urinary stress incontinence, male   . Hematuria     Surgical History: Past Surgical History  Procedure Laterality Date  . Splenectomy  1953    after trauma, fell out tree  . Knee arthroscopy N/A 1969    meniscal tear  . Prostatectomy  2007    removal of prostate  . Ventral hernia repair    . Incontinence surgery      Home Medications:    Medication List       This list is accurate as of: 12/10/14  9:31 AM.  Always use your most recent med list.               betamethasone dipropionate 0.05 % cream  Commonly known as:  DIPROLENE  Apply topically as needed.     DULoxetine 60 MG capsule  Commonly known as:  CYMBALTA  Take 1 capsule (60 mg total) by mouth daily.     multivitamin capsule  Take 1 capsule by mouth daily.     sildenafil 20 MG tablet  Commonly known as:  REVATIO  Take 20 mg by mouth 3 (three) times daily as needed.     valACYclovir 1000 MG tablet  Commonly known as:  VALTREX  Take 1 tablet (1,000 mg total) by mouth 2 (two) times daily.  Allergies: No Known Allergies  Family History: Family History  Problem Relation Age of Onset  . Dementia Mother   . Alcohol abuse Father   . Glaucoma Sister   . Heart disease Brother     heart valve issue  . Kidney disease Neg Hx   . Prostate cancer Neg Hx     Social History:  reports that he quit smoking about 36 years ago. He does not have any smokeless tobacco history on file. He reports that he does not drink alcohol or use illicit drugs.  ROS: UROLOGY Frequent Urination?: No Hard to postpone urination?: No Burning/pain with urination?: No Get up at night to urinate?: Yes Leakage of urine?: No Urine stream starts and stops?: No Trouble starting stream?: No Do you have to strain to urinate?: No Blood in urine?: No Urinary tract infection?: No Sexually  transmitted disease?: No Injury to kidneys or bladder?: No Painful intercourse?: No Weak stream?: No Erection problems?: No Penile pain?: No  Gastrointestinal Nausea?: No Vomiting?: No Indigestion/heartburn?: No Diarrhea?: No Constipation?: No  Constitutional Fever: No Night sweats?: No Weight loss?: No Fatigue?: No  Skin Skin rash/lesions?: No Itching?: No  Eyes Blurred vision?: No Double vision?: No  Ears/Nose/Throat Sore throat?: No Sinus problems?: No  Hematologic/Lymphatic Swollen glands?: No Easy bruising?: No  Cardiovascular Leg swelling?: No Chest pain?: No  Respiratory Cough?: No Shortness of breath?: No  Endocrine Excessive thirst?: No  Musculoskeletal Back pain?: No Joint pain?: No  Neurological Headaches?: No Dizziness?: No  Psychologic Depression?: No Anxiety?: No  Physical Exam: BP 130/84 mmHg  Pulse 83  Ht 5\' 8"  (1.727 m)  Wt 214 lb 11.2 oz (97.387 kg)  BMI 32.65 kg/m2  GU: Patient with circumcised phallus.  Urethral meatus is patent.  No penile discharge. No penile lesions or rashes. Scrotum without lesions, cysts, rashes and/or edema.  Testicles are located scrotally bilaterally. No masses are appreciated in the testicles. Left and right epididymis are normal. Rectal: Patient with  normal sphincter tone. Perineum without scarring or rashes. No rectal masses are appreciated. Prostate is surgical absence.  Seminal vesicles are seen on CT, but I could not palpate on the exam.    Laboratory Data: Lab Results  Component Value Date   WBC 7.3 06/05/2014   HGB 14.8 06/05/2014   HCT 44.1 06/05/2014   MCV 89.3 06/05/2014   PLT 244.0 06/05/2014    Lab Results  Component Value Date   CREATININE 0.77 06/05/2014    Lab Results  Component Value Date   PSA 0.11 07/03/2014    No results found for: TESTOSTERONE  No results found for: HGBA1C  Urinalysis    Component Value Date/Time   GLUCOSEU Negative 10/08/2014 0832    BILIRUBINUR Negative 10/08/2014 0832   BILIRUBINUR neg 07/03/2014 1135   PROTEINUR neg 07/03/2014 1135   UROBILINOGEN 0.2 07/03/2014 1135   NITRITE Negative 10/08/2014 0832   NITRITE neg 07/03/2014 1135   LEUKOCYTESUR Negative 10/08/2014 0832   LEUKOCYTESUR Negative 07/03/2014 1135    Pertinent Imaging: CLINICAL DATA: Status post prostatectomy. Microscopic hematuria  EXAM: CT ABDOMEN AND PELVIS WITHOUT AND WITH CONTRAST  TECHNIQUE: Multidetector CT imaging of the abdomen and pelvis was performed following the standard protocol before and following the bolus administration of intravenous contrast.  CONTRAST: 183mL OMNIPAQUE IOHEXOL 350 MG/ML SOLN  COMPARISON: None  FINDINGS: Lower chest: The lung bases are clear. No pleural or pericardial effusion.  Hepatobiliary: There is hepatic steatosis noted. Small low density structure within the  left lobe of liver measures 5 mm and is too small to characterize. Calcified granulomas identified. The gallbladder is normal. No biliary dilatation.  Pancreas: Normal appearance of the pancreas.  Spleen: The patient is status post splenectomy. There are several splenules identified within the left upper quadrant.  Adrenals/Urinary Tract: The adrenal glands are both normal. Small bilateral renal cysts are identified. No nephrolithiasis or obstructive uropathy. The urinary bladder appears normal. On the delayed images there is symmetric excretion of contrast material from both kidneys. No filling defects within the collecting systems, ureters or urinary bladder.  Stomach/Bowel: The stomach is within normal limits. The small bowel loops have a normal course and caliber. No obstruction. Normal appearance of the colon.  Vascular/Lymphatic: Calcified atherosclerotic disease involves the abdominal aorta. No aneurysm. No enlarged retroperitoneal or mesenteric adenopathy. No enlarged pelvic or inguinal lymph  nodes.  Reproductive: Status post prostatectomy. Symmetric appearance of the seminal vesicles. There are no suspicious lymph nodes identified within the prostate bed or in the surrounding fat.  Other: Scattered soft tissue attenuating nodules are noted within the left abdomen. These are favored to represent small splenules. For example, 1.6 cm soft tissue attenuating structure is noted in the left abdomen, image 32/series 6.  Musculoskeletal: No aggressive lytic or sclerotic bone lesions. Degenerative disc disease noted within the lumbar spine.  IMPRESSION: 1. No acute findings within the abdomen or pelvis. 2. Status post prostatectomy. No specific features identified to suggest residual or recurrent prostate cancer or metastatic disease. 3. Previous splenectomy. Multi focal soft tissue attenuating nodules within the left abdomen are noted and are favored to represent multiple splenules. 4. Hepatic steatosis 5. Aortic atherosclerosis.   Electronically Signed  By: Kerby Moors M.D.  On: 10/17/2014 14:13  Assessment & Plan:    1. History of prostate cancer with rising PSA:   Gleason 3+4, pT2cNxMx s/p laparoscopic radical prostatectomy at [facility] in 2007. Past PSA's are 0.060 ng/mL on 04/24/2012 and  0.11 ng/mL on 07/03/2014.   He has not had any  previous adjuvant/ salvage radiation. At his last visit, Dr. Erlene Quan explained that I do have some concern for biochemical recurrence and most importantly would be his PSA trend. His PSA was drawn today.    2. Microscopic hematuria:   Hematuria workup has been completed. Patient underwent CT urogram and cystoscopy. No GU pathology was found.   We will continue to monitor with annual UA's.  He will notify us if he should experience any gross hematuria.  3. SUI (stress urinary incontinence), male Mild stress urinary incontinence status post sling placed into 2014. Minimal bother  4. Erectile dysfunction following radical  prostatectomy Stable severe erectile dysfunction. Not currently desiring any intervention.    No Follow-up on file.  Zara Council, [facility] [address] [place], [place] [address] [phone]

## 2014-12-11 ENCOUNTER — Telehealth: Payer: Self-pay

## 2014-12-11 LAB — PSA: PROSTATE SPECIFIC AG, SERUM: 0.1 ng/mL (ref 0.0–4.0)

## 2014-12-11 NOTE — Telephone Encounter (Signed)
-----   Message from Nori Riis, PA-C sent at 12/11/2014  1:38 PM EDT ----- Patient's PSA has remained stable at 0.1 ng/mL. NCCN guidelines state subsequent detectable PSA that increases on 2 or more determinations indicates biochemical recurrence.  I would recommend keeping his follow-up appointment in 4 months for repeat PSA, but I will also discuss his case with one of the physicians next week.  If they feel we need to take action, I will contact the patient. Otherwise, we will see him in 4 months.

## 2014-12-11 NOTE — Telephone Encounter (Signed)
Spoke with pt and made aware of PSA results. Pt voiced understanding. Pt did request he receive a call next week either way letting him know what the MD thinks.

## 2014-12-12 ENCOUNTER — Encounter: Payer: Self-pay | Admitting: Urology

## 2014-12-13 ENCOUNTER — Ambulatory Visit: Payer: PPO

## 2014-12-13 DIAGNOSIS — R3129 Other microscopic hematuria: Secondary | ICD-10-CM

## 2014-12-13 LAB — URINALYSIS, COMPLETE
BILIRUBIN UA: NEGATIVE
Glucose, UA: NEGATIVE
Ketones, UA: NEGATIVE
Leukocytes, UA: NEGATIVE
Nitrite, UA: NEGATIVE
Protein, UA: NEGATIVE
Specific Gravity, UA: 1.015 (ref 1.005–1.030)
UUROB: 0.2 mg/dL (ref 0.2–1.0)
pH, UA: 7.5 (ref 5.0–7.5)

## 2014-12-13 NOTE — Progress Notes (Signed)
Pt sent a message via my chart wanting to know is there was anymore blood in his urine. Nurse made pt aware at his appt Larene Beach did not order a u/a. Pt requested to have a u/a done. Pt came in today and gave a clean catch specimen for u/a. Pt voiced understanding of needing to wait until Larene Beach speaks to a MD in reference to his case.

## 2014-12-17 NOTE — Telephone Encounter (Signed)
-----   Message from Nori Riis, PA-C sent at 12/17/2014  1:37 PM EDT ----- Please let the patient know that I have spoken with Dr. Tresa Jadis Pitter concerning his PSA results.  He does not feel it necessary at this time to pursue imaging studies or start any other treatments.  He recommends the patient return in 6 months.  If the PSA reaches 0.2, then we would consider imaging studies at that time.

## 2014-12-17 NOTE — Telephone Encounter (Signed)
Message sent via MyChart:  Larene Beach spoke with Dr. Tresa Jensen [place] regarding your recent labs; he does not feel it necessary at this time to pursue imaging studies or start any other treatments. Dr. Tresa Yehonatan Grandison recommends that you return in 6 months. If your PSA reaches 0.2, then we would consider imaging studies at that time.

## 2014-12-23 ENCOUNTER — Other Ambulatory Visit: Payer: PPO

## 2014-12-23 DIAGNOSIS — Z8546 Personal history of malignant neoplasm of prostate: Secondary | ICD-10-CM

## 2014-12-23 LAB — URINALYSIS, COMPLETE
BILIRUBIN UA: NEGATIVE
GLUCOSE, UA: NEGATIVE
Ketones, UA: NEGATIVE
Leukocytes, UA: NEGATIVE
Nitrite, UA: NEGATIVE
PROTEIN UA: NEGATIVE
RBC UA: NEGATIVE
SPEC GRAV UA: 1.02 (ref 1.005–1.030)
UUROB: 0.2 mg/dL (ref 0.2–1.0)
pH, UA: 7.5 (ref 5.0–7.5)

## 2014-12-23 LAB — MICROSCOPIC EXAMINATION
Bacteria, UA: NONE SEEN
Epithelial Cells (non renal): NONE SEEN /hpf (ref 0–10)

## 2014-12-24 ENCOUNTER — Telehealth: Payer: Self-pay

## 2014-12-24 NOTE — Telephone Encounter (Signed)
Spoke with pt who stated he would prefer to wait a year and have another u/a.

## 2014-12-24 NOTE — Telephone Encounter (Signed)
-----   Message from Nori Riis, PA-C sent at 12/23/2014  9:33 PM EDT ----- Patient has persistent microscopic hematuria.  Dr. Erlene Quan had recommended checking an urine in one year, since he has completed a hematuria work up with [place].  The next step according to AUA guidelines is a referral to a nephrologist.

## 2014-12-25 ENCOUNTER — Encounter: Payer: Self-pay | Admitting: Urology

## 2014-12-26 ENCOUNTER — Telehealth: Payer: Self-pay

## 2014-12-26 DIAGNOSIS — R3129 Other microscopic hematuria: Secondary | ICD-10-CM

## 2014-12-26 NOTE — Telephone Encounter (Signed)
Please advise 

## 2014-12-26 NOTE — Telephone Encounter (Signed)
Please advise?  There is no message.

## 2014-12-27 NOTE — Telephone Encounter (Signed)
He can see the folks across the street from [place] on [address].  They are great!

## 2014-12-27 NOTE — Telephone Encounter (Signed)
No. Just as soon as possible.    ----- Message -----    From: Nurse Tonye Royalty    Sent: 12/25/2014 2:23 PM EDT    To: Jasmine Awe Rase    Subject: RE: Non-Urgent Medical Question        Mr. Abdallah,        I will talk with Larene Beach but we need to put in a referral for you to see a Nephrologist. Do you have a specific one you would like to see?        Thanks,    Vikki Ports        ----- Message -----     From: [name]     Sent: 12/25/2014 11:59 AM EDT      To: Hollice Espy, MD    Subject: Non-Urgent Medical Question        Hi Dr. Erlene Quan or whom ever is reading this.        I spoke with a nurse from your office yesterday about seeing a Nephrologist concerning blood in urine. She said I could do it now or wait a year. I said wait. After reading information about causes of blood in urine , I would like to do test now.    Do I find a doctor for this or will your staff?    This is the my chart conversation

## 2014-12-30 NOTE — Telephone Encounter (Signed)
Referral put in que. My chart message sent to pt making him aware referral.

## 2015-01-29 ENCOUNTER — Encounter: Payer: Self-pay | Admitting: Internal Medicine

## 2015-01-30 ENCOUNTER — Ambulatory Visit (INDEPENDENT_AMBULATORY_CARE_PROVIDER_SITE_OTHER): Payer: PPO | Admitting: Surgical

## 2015-01-30 DIAGNOSIS — Z23 Encounter for immunization: Secondary | ICD-10-CM

## 2015-01-30 NOTE — Progress Notes (Signed)
Patient came in today for Flu Shot in left deltoid. Patient tolerated well.

## 2015-03-04 ENCOUNTER — Encounter: Payer: Self-pay | Admitting: Urology

## 2015-03-04 ENCOUNTER — Telehealth: Payer: Self-pay

## 2015-03-04 NOTE — Telephone Encounter (Signed)
Sept. 6th [facility] e-mail said Dr. Tresa Moore suggested a PSA test in six months to see if 0.2 had been reached.    Are you good with this?            Thank you for your time     This message was sent via mychart. Please advise.

## 2015-03-04 NOTE — Telephone Encounter (Signed)
Pt has been made aware. 

## 2015-03-04 NOTE — Telephone Encounter (Signed)
Yes, I think that is an excellent plan.  Hollice Espy, MD

## 2015-03-17 ENCOUNTER — Encounter: Payer: Self-pay | Admitting: Internal Medicine

## 2015-03-17 ENCOUNTER — Other Ambulatory Visit: Payer: Self-pay

## 2015-03-17 DIAGNOSIS — F4323 Adjustment disorder with mixed anxiety and depressed mood: Secondary | ICD-10-CM

## 2015-03-17 MED ORDER — VALACYCLOVIR HCL 1 G PO TABS: 1000.0000 mg | ORAL_TABLET | Freq: Two times a day (BID) | ORAL | Status: DC

## 2015-03-17 MED ORDER — DULOXETINE HCL 60 MG PO CPEP: 60.0000 mg | ORAL_CAPSULE | Freq: Every day | ORAL | Status: DC

## 2015-03-27 ENCOUNTER — Encounter: Payer: Self-pay | Admitting: Internal Medicine

## 2015-03-28 ENCOUNTER — Encounter: Payer: Self-pay | Admitting: Internal Medicine

## 2015-03-28 ENCOUNTER — Ambulatory Visit (INDEPENDENT_AMBULATORY_CARE_PROVIDER_SITE_OTHER): Payer: PPO | Admitting: Internal Medicine

## 2015-03-28 ENCOUNTER — Other Ambulatory Visit: Payer: Self-pay

## 2015-03-28 VITALS — BP 126/80 | HR 98 | Temp 97.6°F | Wt 217.0 lb

## 2015-03-28 DIAGNOSIS — J209 Acute bronchitis, unspecified: Secondary | ICD-10-CM

## 2015-03-28 MED ORDER — HYDROCOD POLST-CPM POLST ER 10-8 MG/5ML PO SUER: 5.0000 mL | Freq: Two times a day (BID) | ORAL | Status: DC | PRN

## 2015-03-28 MED ORDER — AMOXICILLIN-POT CLAVULANATE 875-125 MG PO TABS: 1.0000 | ORAL_TABLET | Freq: Two times a day (BID) | ORAL | Status: DC

## 2015-03-28 MED ORDER — PREDNISONE 10 MG PO TABS: ORAL_TABLET | ORAL | Status: DC

## 2015-03-28 MED ORDER — GUAIFENESIN-CODEINE 100-10 MG/5ML PO SYRP: 5.0000 mL | ORAL_SOLUTION | Freq: Two times a day (BID) | ORAL | Status: DC | PRN

## 2015-03-28 NOTE — Progress Notes (Signed)
Subjective:    Patient ID: Benjamin Mills, male    DOB: 09-07-1945, 69 y.o.   MRN: [id number]  HPI  69YO male presents for acute visit.  Cough - Started 6 days ago. Also notes congestion and chills at times. Cough seems worse in morning. Tried taking Nyquil with no improvement. No measured fever. Cough is productive of green-grey mucous. Feels short of breath.  Former smoker.  Wt Readings from Last 3 Encounters:  03/28/15 217 lb (98.431 kg)  12/10/14 214 lb 11.2 oz (97.387 kg)  10/17/14 202 lb (91.627 kg)   BP Readings from Last 3 Encounters:  03/28/15 126/80  12/10/14 130/84  10/08/14 113/78    Past Medical History  Diagnosis Date  . History of splenectomy   . Hyperlipidemia   . Gastric ulcer   . Cancer [facility]) 2006    Prostate cancer with resection.  . Bleeding disorder ([place])   . Depression   . GERD (gastroesophageal reflux disease)   . Erectile dysfunction   . Urinary stress incontinence, male   . Hematuria    Family History  Problem Relation Age of Onset  . Dementia Mother   . Alcohol abuse Father   . Glaucoma Sister   . Heart disease Brother     heart valve issue  . Kidney disease Neg Hx   . Prostate cancer Neg Hx    Past Surgical History  Procedure Laterality Date  . Splenectomy  1953    after trauma, fell out tree  . Knee arthroscopy N/A 1969    meniscal tear  . Prostatectomy  2007    removal of prostate  . Ventral hernia repair    . Incontinence surgery     Social History   Social History  . Marital Status: Married    Spouse Name: N/A  . Number of Children: N/A  . Years of Education: N/A   Social History Main Topics  . Smoking status: Former Smoker    Quit date: 06/05/1978  . Smokeless tobacco: None  . Alcohol Use: No  . Drug Use: No  . Sexual Activity: Not Asked   Other Topics Concern  . None   Social History Narrative   Born in [place].   Lived in [place].   Moved to [place], brother lives in [place].      Has 2 brothers and  1 sister.      Lives with wife in [place]. Has 3 daughters.      Work - retired, Surveyor, quantity      Diet - regular      Exercise - walks occasionally    Review of Systems  Constitutional: Positive for chills and fatigue. Negative for fever, activity change, appetite change and unexpected weight change.  HENT: Positive for congestion, postnasal drip and rhinorrhea. Negative for ear discharge, ear pain, hearing loss, nosebleeds, sinus pressure, sneezing, sore throat, tinnitus, trouble swallowing and voice change.   Eyes: Negative for discharge, redness, itching and visual disturbance.  Respiratory: Positive for cough and shortness of breath. Negative for chest tightness, wheezing and stridor.   Cardiovascular: Negative for chest pain, palpitations and leg swelling.  Gastrointestinal: Negative for abdominal pain and abdominal distention.  Genitourinary: Negative for dysuria, urgency and difficulty urinating.  Musculoskeletal: Negative for myalgias, arthralgias, gait problem, neck pain and neck stiffness.  Skin: Negative for color change and rash.  Neurological: Negative for dizziness, facial asymmetry and headaches.  Hematological: Negative for adenopathy.  Psychiatric/Behavioral: Negative for sleep disturbance and dysphoric  mood. The patient is not nervous/anxious.        Objective:    BP 126/80 mmHg  Pulse 98  Temp(Src) 97.6 F (36.4 C) (Oral)  Wt 217 lb (98.431 kg)  SpO2 98% Physical Exam  Constitutional: He is oriented to person, place, and time. He appears well-developed and well-nourished. No distress.  HENT:  Head: Normocephalic and atraumatic.  Right Ear: External ear normal.  Left Ear: External ear normal.  Nose: Nose normal.  Mouth/Throat: Oropharynx is clear and moist. No oropharyngeal exudate.  Eyes: Conjunctivae and EOM are normal. Pupils are equal, round, and reactive to light. Right eye exhibits no discharge. Left eye exhibits no discharge. No scleral icterus.    Neck: Normal range of motion. Neck supple. No tracheal deviation present. No thyromegaly present.  Cardiovascular: Normal rate, regular rhythm and normal heart sounds.  Exam reveals no gallop and no friction rub.   No murmur heard. Pulmonary/Chest: Effort normal. No accessory muscle usage. No tachypnea. No respiratory distress. He has decreased breath sounds. He has no wheezes. He has rhonchi. He has no rales. He exhibits no tenderness.  Musculoskeletal: Normal range of motion. He exhibits no edema.  Lymphadenopathy:    He has no cervical adenopathy.  Neurological: He is alert and oriented to person, place, and time. No cranial nerve deficit. Coordination normal.  Skin: Skin is warm and dry. No rash noted. He is not diaphoretic. No erythema. No pallor.  Psychiatric: He has a normal mood and affect. His behavior is normal. Judgment and thought content normal.          Assessment & Plan:   Problem List Items Addressed This Visit      Unprioritized   Acute bronchitis - Primary    Symptoms and exam most consistent with acute bronchitis. Will start Prednisone taper and Augmentin. Tussionex for cough. Encouraged rest and adequate fluids. Follow up if symptoms are not improving.      Relevant Medications   predniSONE (DELTASONE) 10 MG tablet   amoxicillin-clavulanate (AUGMENTIN) 875-125 MG tablet   chlorpheniramine-HYDROcodone (TUSSIONEX PENNKINETIC ER) 10-8 MG/5ML SUER       Return if symptoms worsen or fail to improve.

## 2015-03-28 NOTE — Assessment & Plan Note (Signed)
Symptoms and exam most consistent with acute bronchitis. Will start Prednisone taper and Augmentin. Tussionex for cough. Encouraged rest and adequate fluids. Follow up if symptoms are not improving.

## 2015-03-28 NOTE — Patient Instructions (Signed)
Start Augmentin twice daily and Prednisone taper. Use Tussionex for cough.  Follow up if symptoms are not improving.  Acute Bronchitis Bronchitis is inflammation of the airways that extend from the windpipe into the lungs (bronchi). The inflammation often causes mucus to develop. This leads to a cough, which is the most common symptom of bronchitis.  In acute bronchitis, the condition usually develops suddenly and goes away over time, usually in a couple weeks. Smoking, allergies, and asthma can make bronchitis worse. Repeated episodes of bronchitis may cause further lung problems.  CAUSES Acute bronchitis is most often caused by the same virus that causes a cold. The virus can spread from person to person (contagious) through coughing, sneezing, and touching contaminated objects. SIGNS AND SYMPTOMS   Cough.   Fever.   Coughing up mucus.   Body aches.   Chest congestion.   Chills.   Shortness of breath.   Sore throat.  DIAGNOSIS  Acute bronchitis is usually diagnosed through a physical exam. Your health care provider will also ask you questions about your medical history. Tests, such as chest X-rays, are sometimes done to rule out other conditions.  TREATMENT  Acute bronchitis usually goes away in a couple weeks. Oftentimes, no medical treatment is necessary. Medicines are sometimes given for relief of fever or cough. Antibiotic medicines are usually not needed but may be prescribed in certain situations. In some cases, an inhaler may be recommended to help reduce shortness of breath and control the cough. A cool mist vaporizer may also be used to help thin bronchial secretions and make it easier to clear the chest.  HOME CARE INSTRUCTIONS  Get plenty of rest.   Drink enough fluids to keep your urine clear or pale yellow (unless you have a medical condition that requires fluid restriction). Increasing fluids may help thin your respiratory secretions (sputum) and reduce  chest congestion, and it will prevent dehydration.   Take medicines only as directed by your health care provider.  If you were prescribed an antibiotic medicine, finish it all even if you start to feel better.  Avoid smoking and secondhand smoke. Exposure to cigarette smoke or irritating chemicals will make bronchitis worse. If you are a smoker, consider using nicotine gum or skin patches to help control withdrawal symptoms. Quitting smoking will help your lungs heal faster.   Reduce the chances of another bout of acute bronchitis by washing your hands frequently, avoiding people with cold symptoms, and trying not to touch your hands to your mouth, nose, or eyes.   Keep all follow-up visits as directed by your health care provider.  SEEK MEDICAL CARE IF: Your symptoms do not improve after 1 week of treatment.  SEEK IMMEDIATE MEDICAL CARE IF:  You develop an increased fever or chills.   You have chest pain.   You have severe shortness of breath.  You have bloody sputum.   You develop dehydration.  You faint or repeatedly feel like you are going to pass out.  You develop repeated vomiting.  You develop a severe headache. MAKE SURE YOU:   Understand these instructions.  Will watch your condition.  Will get help right away if you are not doing well or get worse.   This information is not intended to replace advice given to you by your health care provider. Make sure you discuss any questions you have with your health care provider.   Document Released: 05/06/2004 Document Revised: 04/19/2014 Document Reviewed: 09/19/2012 Elsevier Interactive Patient Education 2016 Elsevier  Inc.  

## 2015-04-09 ENCOUNTER — Ambulatory Visit (INDEPENDENT_AMBULATORY_CARE_PROVIDER_SITE_OTHER): Payer: PPO

## 2015-04-09 ENCOUNTER — Ambulatory Visit: Payer: PPO | Admitting: Internal Medicine

## 2015-04-09 VITALS — BP 118/78 | HR 86 | Temp 98.0°F | Resp 14 | Ht 67.0 in | Wt 215.1 lb

## 2015-04-09 DIAGNOSIS — Z0000 Encounter for general adult medical examination without abnormal findings: Secondary | ICD-10-CM

## 2015-04-09 DIAGNOSIS — Z1159 Encounter for screening for other viral diseases: Secondary | ICD-10-CM

## 2015-04-09 NOTE — Progress Notes (Signed)
Annual Wellness Visit as completed by Health Coach was reviewed in full.  

## 2015-04-09 NOTE — Patient Instructions (Addendum)
Benjamin Mills,  Thank you for taking time to come for your Medicare Wellness Visit.  I appreciate your ongoing commitment to your health goals. Please review the following plan we discussed and let me know if I can assist you in the future.  Follow up with PCP as needed.  Happy New Year!  Happy Early [name]!  Health Maintenance, Male A healthy lifestyle and preventative care can promote health and wellness.  Maintain regular health, dental, and eye exams.  Eat a healthy diet. Foods like vegetables, fruits, whole grains, low-fat dairy products, and lean protein foods contain the nutrients you need and are low in calories. Decrease your intake of foods high in solid fats, added sugars, and salt. Get information about a proper diet from your health care provider, if necessary.  Regular physical exercise is one of the most important things you can do for your health. Most adults should get at least 150 minutes of moderate-intensity exercise (any activity that increases your heart rate and causes you to sweat) each week. In addition, most adults need muscle-strengthening exercises on 2 or more days a week.   Maintain a healthy weight. The body mass index (BMI) is a screening tool to identify possible weight problems. It provides an estimate of body fat based on height and weight. Your health care provider can find your BMI and can help you achieve or maintain a healthy weight. For males 20 years and older:  A BMI below 18.5 is considered underweight.  A BMI of 18.5 to 24.9 is normal.  A BMI of 25 to 29.9 is considered overweight.  A BMI of 30 and above is considered obese.  Maintain normal blood lipids and cholesterol by exercising and minimizing your intake of saturated fat. Eat a balanced diet with plenty of fruits and vegetables. Blood tests for lipids and cholesterol should begin at age 4 and be repeated every 5 years. If your lipid or cholesterol levels are high, you are over age 105,  or you are at high risk for heart disease, you may need your cholesterol levels checked more frequently.Ongoing high lipid and cholesterol levels should be treated with medicines if diet and exercise are not working.  If you smoke, find out from your health care provider how to quit. If you do not use tobacco, do not start.  Lung cancer screening is recommended for adults aged 7-80 years who are at high risk for developing lung cancer because of a history of smoking. A yearly low-dose CT scan of the lungs is recommended for people who have at least a 30-pack-year history of smoking and are current smokers or have quit within the past 15 years. A pack year of smoking is smoking an average of 1 pack of cigarettes a day for 1 year (for example, a 30-pack-year history of smoking could mean smoking 1 pack a day for 30 years or 2 packs a day for 15 years). Yearly screening should continue until the smoker has stopped smoking for at least 15 years. Yearly screening should be stopped for people who develop a health problem that would prevent them from having lung cancer treatment.  If you choose to drink alcohol, do not have more than 2 drinks per day. One drink is considered to be 12 oz (360 mL) of beer, 5 oz (150 mL) of wine, or 1.5 oz (45 mL) of liquor.  Avoid the use of street drugs. Do not share needles with anyone. Ask for help if you need support  or instructions about stopping the use of drugs.  High blood pressure causes heart disease and increases the risk of stroke. High blood pressure is more likely to develop in:  People who have blood pressure in the end of the normal range (100-139/85-89 mm Hg).  People who are overweight or obese.  People who are African American.  If you are 36-41 years of age, have your blood pressure checked every 3-5 years. If you are 53 years of age or older, have your blood pressure checked every year. You should have your blood pressure measured twice--once when you  are at a hospital or clinic, and once when you are not at a hospital or clinic. Record the average of the two measurements. To check your blood pressure when you are not at a hospital or clinic, you can use:  An automated blood pressure machine at a pharmacy.  A home blood pressure monitor.  If you are 40-62 years old, ask your health care provider if you should take aspirin to prevent heart disease.  Diabetes screening involves taking a blood sample to check your fasting blood sugar level. This should be done once every 3 years after age 47 if you are at a normal weight and without risk factors for diabetes. Testing should be considered at a younger age or be carried out more frequently if you are overweight and have at least 1 risk factor for diabetes.  Colorectal cancer can be detected and often prevented. Most routine colorectal cancer screening begins at the age of 41 and continues through age 96. However, your health care provider may recommend screening at an earlier age if you have risk factors for colon cancer. On a yearly basis, your health care provider may provide home test kits to check for hidden blood in the stool. A small camera at the end of a tube may be used to directly examine the colon (sigmoidoscopy or colonoscopy) to detect the earliest forms of colorectal cancer. Talk to your health care provider about this at age 28 when routine screening begins. A direct exam of the colon should be repeated every 5-10 years through age 77, unless early forms of precancerous polyps or small growths are found.  People who are at an increased risk for hepatitis B should be screened for this virus. You are considered at high risk for hepatitis B if:  You were born in a country where hepatitis B occurs often. Talk with your health care provider about which countries are considered high risk.  Your parents were born in a high-risk country and you have not received a shot to protect against  hepatitis B (hepatitis B vaccine).  You have HIV or AIDS.  You use needles to inject street drugs.  You live with, or have sex with, someone who has hepatitis B.  You are a man who has sex with other men (MSM).  You get hemodialysis treatment.  You take certain medicines for conditions like cancer, organ transplantation, and autoimmune conditions.  Hepatitis C blood testing is recommended for all people born from [birth year] through 1965 and any individual with known risk factors for hepatitis C.  Healthy men should no longer receive prostate-specific antigen (PSA) blood tests as part of routine cancer screening. Talk to your health care provider about prostate cancer screening.  Testicular cancer screening is not recommended for adolescents or adult males who have no symptoms. Screening includes self-exam, a health care provider exam, and other screening tests. Consult with your health  care provider about any symptoms you have or any concerns you have about testicular cancer.  Practice safe sex. Use condoms and avoid high-risk sexual practices to reduce the spread of sexually transmitted infections (STIs).  You should be screened for STIs, including gonorrhea and chlamydia if:  You are sexually active and are younger than 24 years.  You are older than 24 years, and your health care provider tells you that you are at risk for this type of infection.  Your sexual activity has changed since you were last screened, and you are at an increased risk for chlamydia or gonorrhea. Ask your health care provider if you are at risk.  If you are at risk of being infected with HIV, it is recommended that you take a prescription medicine daily to prevent HIV infection. This is called pre-exposure prophylaxis (PrEP). You are considered at risk if:  You are a man who has sex with other men (MSM).  You are a heterosexual man who is sexually active with multiple partners.  You take drugs by  injection.  You are sexually active with a partner who has HIV.  Talk with your health care provider about whether you are at high risk of being infected with HIV. If you choose to begin PrEP, you should first be tested for HIV. You should then be tested every 3 months for as long as you are taking PrEP.  Use sunscreen. Apply sunscreen liberally and repeatedly throughout the day. You should seek shade when your shadow is shorter than you. Protect yourself by wearing long sleeves, pants, a wide-brimmed hat, and sunglasses year round whenever you are outdoors.  Tell your health care provider of new moles or changes in moles, especially if there is a change in shape or color. Also, tell your health care provider if a mole is larger than the size of a pencil eraser.  A one-time screening for abdominal aortic aneurysm (AAA) and surgical repair of large AAAs by ultrasound is recommended for men aged 58-75 years who are current or former smokers.  Stay current with your vaccines (immunizations).   This information is not intended to replace advice given to you by your health care provider. Make sure you discuss any questions you have with your health care provider.   Document Released: 09/25/2007 Document Revised: 04/19/2014 Document Reviewed: 08/24/2010 Elsevier Interactive Patient Education [employer].

## 2015-04-09 NOTE — Progress Notes (Signed)
Subjective:   Benjamin Mills is a 69 y.o. male who presents for an Initial Medicare Annual Wellness Visit.  Review of Systems  No ROS.  Medicare Wellness Visit.   Cardiac Risk Factors include: advanced age (>65men, >77 women)    Objective:    Today's Vitals   04/09/15 1108  BP: 118/78  Pulse: 86  Temp: 98 F (36.7 C)  TempSrc: Oral  Resp: 14  Height: 5\' 7"  (1.702 m)  Weight: 215 lb 1.9 oz (97.578 kg)  SpO2: 98%    Current Medications (verified) Outpatient Encounter Prescriptions as of 04/09/2015  Medication Sig  . betamethasone dipropionate (DIPROLENE) 0.05 % cream Apply topically as needed.   . chlorpheniramine-HYDROcodone (TUSSIONEX PENNKINETIC ER) 10-8 MG/5ML SUER Take 5 mLs by mouth every 12 (twelve) hours as needed.  . DULoxetine (CYMBALTA) 60 MG capsule Take 1 capsule (60 mg total) by mouth daily.  . Multiple Vitamin (MULTIVITAMIN) capsule Take 1 capsule by mouth daily.  . sildenafil (REVATIO) 20 MG tablet Take 20 mg by mouth 3 (three) times daily as needed.  . valACYclovir (VALTREX) 1000 MG tablet Take 1 tablet (1,000 mg total) by mouth 2 (two) times daily.  . [DISCONTINUED] amoxicillin-clavulanate (AUGMENTIN) 875-125 MG tablet Take 1 tablet by mouth 2 (two) times daily.  . [DISCONTINUED] guaiFENesin-codeine (CHERATUSSIN AC) 100-10 MG/5ML syrup Take 5 mLs by mouth every 12 (twelve) hours as needed for cough (take as needed for cough.).  . [DISCONTINUED] predniSONE (DELTASONE) 10 MG tablet Take 60mg  by mouth on day 1, then taper by 10mg  daily until gone   No facility-administered encounter medications on file as of 04/09/2015.    Allergies (verified) Review of patient's allergies indicates no known allergies.   History: Past Medical History  Diagnosis Date  . History of splenectomy   . Hyperlipidemia   . Gastric ulcer   . Cancer [facility]) 2006    Prostate cancer with resection.  . Bleeding disorder ([place])   . Depression   . GERD (gastroesophageal reflux  disease)   . Erectile dysfunction   . Urinary stress incontinence, male   . Hematuria    Past Surgical History  Procedure Laterality Date  . Splenectomy  1953    after trauma, fell out tree  . Knee arthroscopy N/A 1969    meniscal tear  . Prostatectomy  2007    removal of prostate  . Ventral hernia repair    . Incontinence surgery     Family History  Problem Relation Age of Onset  . Dementia Mother   . Alcohol abuse Father   . Glaucoma Sister   . Heart disease Brother     heart valve issue  . Multiple sclerosis Brother   . Kidney disease Neg Hx   . Prostate cancer Neg Hx    Social History   Occupational History  . Not on file.   Social History Main Topics  . Smoking status: Former Smoker    Quit date: 06/05/1978  . Smokeless tobacco: Not on file  . Alcohol Use: 0.0 oz/week    0 Standard drinks or equivalent per week     Comment: OCC  . Drug Use: No  . Sexual Activity: Not Currently   Tobacco Counseling Counseling given: Not Answered   Activities of Daily Living In your present state of health, do you have any difficulty performing the following activities: 04/09/2015  Hearing? N  Vision? N  Difficulty concentrating or making decisions? N  Walking or climbing stairs? N  Dressing or bathing? N  Doing errands, shopping? N  Preparing Food and eating ? N  Using the Toilet? N  In the past six months, have you accidently leaked urine? N  Do you have problems with loss of bowel control? N  Managing your Medications? N  Managing your Finances? N  Housekeeping or managing your Housekeeping? N    Immunizations and Health Maintenance Immunization History  Administered Date(s) Administered  . Influenza,inj,Quad PF,36+ Mos 01/30/2015  . Influenza-Unspecified 02/02/2014  . Pneumococcal Polysaccharide-23 06/05/2012   Health Maintenance Due  Topic Date Due  . Hepatitis C Screening  11/12/1945  . TETANUS/TDAP  04/25/1964  . ZOSTAVAX  04/25/2005  . PNA vac Low  Risk Adult (2 of 2 - PCV13) 06/05/2013    Patient Care Team: Jackolyn Confer, MD as PCP - General (Internal Medicine)  Indicate any recent Medical Services you may have received from other than [facility] providers in the past year (date may be approximate).    Assessment:   This is a routine wellness examination for Benjamin Mills.  The goal of the wellness visit is to assist the patient how to close the gaps in care and create a preventative care plan for the patient.   Osteoporosis risk reviewed.  Medications reviewed; taking without issues or barriers. Reports Cymbalta is working very well.  Safety issues reviewed; smoke detectors in the home. No firearms in the home. Wears seatbelts when driving or riding with others. No violence in the home.  No identified risk were noted; The patient was oriented x 3; appropriate in dress and manner and no objective failures at ADL's or IADL's.   Prevnar 13, TDAP, ZOSTAVAX vaccine postponed, per patient request.    Labs completed: Hep C Screening  Patient Concerns:  Anxiety surrounding PSA changes; upcoming appointment in January. Deferred to PCP for follow up with concerns before or after appointment.   Hearing/Vision screen Hearing Screening Comments: Passes the whisper test Vision Screening Comments: Followed by Erlene Quan Wears glasses  Dietary issues and exercise activities discussed: Current Exercise Habits:: Home exercise routine, Type of exercise: walking, Time (Minutes): 60, Frequency (Times/Week): 3, Weekly Exercise (Minutes/Week): 180, Intensity: Mild  Goals    . Increase physical activity     Maintain walking regiment.  Start attending the gym with brother as often as possible.      Depression Screen PHQ 2/9 Scores 04/09/2015 06/05/2014  PHQ - 2 Score 0 0    Fall Risk Fall Risk  04/09/2015 06/05/2014  Falls in the past year? No No    Cognitive Function: MMSE - Mini Mental State Exam 04/09/2015  Orientation to time 5    Orientation to Place 5  Registration 3  Attention/ Calculation 5  Recall 3  Language- name 2 objects 2  Language- repeat 1  Language- follow 3 step command 3  Language- read & follow direction 1  Write a sentence 1  Copy design 1  Total score 30    Screening Tests Health Maintenance  Topic Date Due  . Hepatitis C Screening  Aug 13, 1945  . TETANUS/TDAP  04/25/1964  . ZOSTAVAX  04/25/2005  . PNA vac Low Risk Adult (2 of 2 - PCV13) 06/05/2013  . INFLUENZA VACCINE  11/11/2015  . COLONOSCOPY  08/29/2021        Plan:   End of life planning; Advance aging; Advanced directives discussed. Copy of HCPOA/Living Will requested upon completion.   Follow up with PCP as needed.   During the course of the  visit Osher was educated and counseled about the following appropriate screening and preventive services:   Vaccines to include Pneumoccal, Influenza, Hepatitis B, Td, Zostavax, HCV  Electrocardiogram  Colorectal cancer screening  Cardiovascular disease screening  Diabetes screening  Glaucoma screening  Nutrition counseling  Prostate cancer screening  Smoking cessation counseling  Patient Instructions (the written plan) were given to the patient.   Varney Biles, LPN   [id number]

## 2015-04-10 LAB — HEPATITIS C ANTIBODY: HCV AB: NEGATIVE

## 2015-04-11 ENCOUNTER — Encounter: Payer: Self-pay | Admitting: Internal Medicine

## 2015-04-25 DIAGNOSIS — R319 Hematuria, unspecified: Secondary | ICD-10-CM | POA: Diagnosis not present

## 2015-04-25 DIAGNOSIS — I10 Essential (primary) hypertension: Secondary | ICD-10-CM | POA: Diagnosis not present

## 2015-05-20 ENCOUNTER — Encounter: Payer: Self-pay | Admitting: *Deleted

## 2015-05-28 ENCOUNTER — Other Ambulatory Visit: Payer: PPO

## 2015-05-28 DIAGNOSIS — R972 Elevated prostate specific antigen [PSA]: Secondary | ICD-10-CM | POA: Diagnosis not present

## 2015-05-29 LAB — PSA: PROSTATE SPECIFIC AG, SERUM: 0.2 ng/mL (ref 0.0–4.0)

## 2015-06-03 ENCOUNTER — Telehealth: Payer: Self-pay | Admitting: Urology

## 2015-06-03 ENCOUNTER — Ambulatory Visit (INDEPENDENT_AMBULATORY_CARE_PROVIDER_SITE_OTHER): Payer: PPO | Admitting: Urology

## 2015-06-03 ENCOUNTER — Encounter: Payer: Self-pay | Admitting: Urology

## 2015-06-03 ENCOUNTER — Telehealth: Payer: Self-pay

## 2015-06-03 VITALS — BP 128/70 | HR 89 | Ht 68.0 in | Wt 217.3 lb

## 2015-06-03 DIAGNOSIS — N5231 Erectile dysfunction following radical prostatectomy: Secondary | ICD-10-CM | POA: Diagnosis not present

## 2015-06-03 DIAGNOSIS — N393 Stress incontinence (female) (male): Secondary | ICD-10-CM

## 2015-06-03 DIAGNOSIS — C61 Malignant neoplasm of prostate: Secondary | ICD-10-CM | POA: Diagnosis not present

## 2015-06-03 NOTE — Telephone Encounter (Signed)
  Oncology Nurse Navigator Documentation  Navigator Location: [facility] (06/03/15 1500) Navigator Encounter Type: Telephone;Introductory phone call (06/03/15 1500) Telephone: Appt Confirmation/Clarification;Outgoing Call (06/03/15 1500)   Confirmed Diagnosis Date:  (2007) (06/03/15 1500) Surgery Date:  (2007) (06/03/15 1500)       Barriers/Navigation Needs: Coordination of Care (06/03/15 1500)   Interventions: Coordination of Care (06/03/15 1500)   Coordination of Care: Appts (06/03/15 1500)        Acuity: Level 2 (06/03/15 1500)   Acuity Level 2: Initial guidance, education and coordination as needed;Educational needs;Assistance expediting appointments;Ongoing guidance and education throughout treatment as needed (06/03/15 1500)     Time Spent with Patient: 15 (06/03/15 1500)   Appt arranged for Radiation Oncology referral from Dr Erlene Quan for 06/16/15 at 1400. Directions given. Readback performed.

## 2015-06-03 NOTE — Progress Notes (Signed)
5:41 PM  06/05/2015   Benjamin Mills 30-Jan-1946 [id number]  Referring provider: Jackolyn Confer, MD [id number] [place], [place] [address]  Chief Complaint  Patient presents with  . Follow-up    PSA, 74mo    HPI: 70 year old male with a history of prostate cancer status post robotic prostatectomy in 2007 and microscopic hematuria.   History of prostate cancer/ rising PSA He was diagnosed and underwent laparoscopic radical prostatectomy at [facility] in [place] back in 2007.  Pathology showed Gleason 3+4, pT2cNxMx. He's not had any adjuvant radiation.   PSA on 04/24/2012 was 0.060 at [facility] PSA drawn by his PCP was 0.11 performed on 07/03/14  0.1 on 11/2014 0.2 on 05/28/15  ED severe erectile dysfunction following prostatectomy Tried Viagra once a few years ago and developed a headache and is scared to try anything since  SUI He also has a history of stress urinary incontinence and underwent placement of a male urethral sling in 2014. Prior to placement of the sling, he leaked 3-4 pads a day but now is down to minimal. He is quite pleased with the result.  History of hematuria Cysto negative for any bladder pathology on 09/10/14.  CT Urogram 10/2014 negative.  NED.  PMH: Past Medical History  Diagnosis Date  . History of splenectomy   . Hyperlipidemia   . Gastric ulcer   . Cancer [facility]) 2006    Prostate cancer with resection.  . Bleeding disorder ([place])   . Depression   . GERD (gastroesophageal reflux disease)   . Erectile dysfunction   . Urinary stress incontinence, male   . Hematuria     Surgical History: Past Surgical History  Procedure Laterality Date  . Splenectomy  1953    after trauma, fell out tree  . Knee arthroscopy N/A 1969    meniscal tear  . Prostatectomy  2007    removal of prostate  . Ventral hernia repair    . Incontinence surgery      Home Medications:    Medication List       This list is  accurate as of: 06/03/15 11:59 PM.  Always use your most recent med list.               betamethasone dipropionate 0.05 % cream  Commonly known as:  DIPROLENE  Apply topically as needed. Reported on 06/03/2015     DULoxetine 60 MG capsule  Commonly known as:  CYMBALTA  Take 1 capsule (60 mg total) by mouth daily.     multivitamin capsule  Take 1 capsule by mouth daily.     sildenafil 20 MG tablet  Commonly known as:  REVATIO  Take 20 mg by mouth 3 (three) times daily as needed. Reported on 06/03/2015     valACYclovir 1000 MG tablet  Commonly known as:  VALTREX  Take 1 tablet (1,000 mg total) by mouth 2 (two) times daily.        Allergies: No Known Allergies  Family History: Family History  Problem Relation Age of Onset  . Dementia Mother   . Alcohol abuse Father   . Glaucoma Sister   . Heart disease Brother     heart valve issue  . Multiple sclerosis Brother   . Kidney disease Neg Hx   . Prostate cancer Neg Hx     Social History:  reports that he quit smoking about 37 years ago. He does not have any smokeless tobacco history on file.  He reports that he drinks alcohol. He reports that he does not use illicit drugs.  ROS: Urological Symptom Review  Patient is experiencing the following symptoms: Leakage of urine Erection problems (male only)   Review of Systems UROLOGY Frequent Urination?: No Hard to postpone urination?: No Burning/pain with urination?: No Get up at night to urinate?: No Leakage of urine?: No Urine stream starts and stops?: No Trouble starting stream?: No Do you have to strain to urinate?: No Blood in urine?: No Urinary tract infection?: No Sexually transmitted disease?: No Injury to kidneys or bladder?: No Painful intercourse?: No Weak stream?: No Erection problems?: Yes Penile pain?: No Gastrointestinal Nausea?: No Vomiting?: No Indigestion/heartburn?: No Diarrhea?: No Constipation?: No Constitutional Fever: No Night  sweats?: No Weight loss?: No Fatigue?: No Skin Skin rash/lesions?: No Itching?: No Eyes Blurred vision?: No Double vision?: No Ears/Nose/Throat Sore throat?: No Sinus problems?: No Hematologic/Lymphatic Swollen glands?: No Easy bruising?: No Cardiovascular Leg swelling?: No Chest pain?: No Respiratory Cough?: No Shortness of breath?: No Endocrine Excessive thirst?: No Musculoskeletal Back pain?: No Joint pain?: No Neurological Headaches?: No Dizziness?: No Psychologic Depression?: Yes Anxiety?: Yes   Physical Exam: BP 128/70 mmHg  Pulse 89  Ht 5\' 8"  (1.727 m)  Wt 217 lb 4.8 oz (98.567 kg)  BMI 33.05 kg/m2  Constitutional:  Alert and oriented, No acute distress.  Presents with wife today. HEENT: [place] AT, moist mucus membranes.  Trachea midline, no masses. Cardiovascular: No clubbing, cyanosis, or edema. Respiratory: Normal respiratory effort, no increased work of breathing. GI: Abdomen is soft, nontender, nondistended, no abdominal masses Skin: No rashes, bruises or suspicious lesions. Neurologic: Grossly intact, no focal deficits, moving all 4 extremities. Psychiatric: Normal mood and affect.  Laboratory Data: Lab Results  Component Value Date   WBC 7.3 06/05/2014   HGB 14.8 06/05/2014   HCT 44.1 06/05/2014   MCV 89.3 06/05/2014   PLT 244.0 06/05/2014    Lab Results  Component Value Date   CREATININE 0.77 06/05/2014    Lab Results  Component Value Date   PSA 0.11 07/03/2014   Urinalysis Results for orders placed or performed in visit on 05/28/15  PSA  Result Value Ref Range   Prostate Specific Ag, Serum 0.2 0.0 - 4.0 ng/mL    Pertinent Imaging: n/a  Assessment & Plan:    1. History of prostate cancer Gleason 3+4, pT2cNxMx s/p laparoscopic radical prostatectomy at [facility] in 2007. Rising PSA now concerning for biochemical recurrence, most recent PSA 0.2 which is doubled in approximately one year.  We'll proceed with restaging CT  abdomen and pelvis with and without contrast although suspect this will be negative.  Discussed that now that his PSA is 0.2, I would recommend consideration of adjuvant radiation to the pelvis. I will refer him to Dr. Baruch Gouty for further discussion of this. We briefly discussed just common side effects of radiation today.  2. SUI (stress urinary incontinence), male  Mild stress urinary incontinence status post sling placed into 2014. Minimal bother  3. Erectile dysfunction following radical prostatectomy  Stable severe erectile dysfunction. Not currently desiring any intervention.    Return in about 6 months (around 12/01/2015) for f/u prostate cancer.  Hollice Espy, MD  [facility] [address] [place], [place] [address] [phone]

## 2015-06-03 NOTE — Telephone Encounter (Signed)
Ned Card, Just an Juluis Rainier can you keep a check on this and make sure this gets scheduled in a timley manner per Dr. Erlene Quan. Please and thank you so much.    Sharyn Lull

## 2015-06-12 ENCOUNTER — Ambulatory Visit
Admission: RE | Admit: 2015-06-12 | Discharge: 2015-06-12 | Disposition: A | Discharge status: Home / Self Care | Payer: PPO | Source: Ambulatory Visit | Attending: Urology | Admitting: Urology

## 2015-06-12 DIAGNOSIS — C61 Malignant neoplasm of prostate: Secondary | ICD-10-CM | POA: Diagnosis not present

## 2015-06-12 DIAGNOSIS — K5730 Diverticulosis of large intestine without perforation or abscess without bleeding: Secondary | ICD-10-CM | POA: Insufficient documentation

## 2015-06-12 LAB — POCT I-STAT CREATININE: CREATININE: 0.8 mg/dL (ref 0.61–1.24)

## 2015-06-12 MED ORDER — IOHEXOL 300 MG/ML  SOLN
100.0000 mL | Freq: Once | INTRAMUSCULAR | Status: AC | PRN
  Administered 2015-06-12: 100 mL via INTRAVENOUS

## 2015-06-16 ENCOUNTER — Ambulatory Visit
Admission: RE | Admit: 2015-06-16 | Discharge: 2015-06-16 | Disposition: A | Discharge status: Home / Self Care | Payer: PPO | Source: Ambulatory Visit | Attending: Radiation Oncology | Admitting: Radiation Oncology

## 2015-06-16 ENCOUNTER — Encounter: Payer: Self-pay | Admitting: Radiation Oncology

## 2015-06-16 VITALS — BP 137/85 | HR 75 | Temp 96.7°F | Resp 18 | Wt 218.6 lb

## 2015-06-16 DIAGNOSIS — C61 Malignant neoplasm of prostate: Secondary | ICD-10-CM

## 2015-06-16 DIAGNOSIS — Z510 Encounter for antineoplastic radiation therapy: Secondary | ICD-10-CM | POA: Insufficient documentation

## 2015-06-16 NOTE — Consult Note (Signed)
Except an outstanding is perfect of Radiation Oncology NEW PATIENT EVALUATION  Name: Benjamin Mills  MRN: [id number]  Date:   06/16/2015     DOB: [birth year]   This 70 y.o. male patient presents to the clinic for initial evaluation of salvage radiation therapy for prostate cancer.  REFERRING PHYSICIAN: Jackolyn Confer, MD  CHIEF COMPLAINT:  Chief Complaint  Patient presents with  . Prostate Cancer    Pt is here for initial consultation of prostate cancer.      DIAGNOSIS: The encounter diagnosis was Malignant neoplasm of prostate ([place]).   PREVIOUS INVESTIGATIONS:  CT scans of abdomen and pelvis reviewed Original surgical pathology report requested for my review Clinical notes reviewed  HPI: Patient is a pleasant 70 year old man who underwent radical robotic-assisted prostatectomy at [facility] in [place] back in 2007. The time pathology was a Gleason 7 (3+4) pathologic T2c. His PSA has incrementally climbed most notably from 0.1-0.2 over the past year. He was seen by urology and recommendation for evaluation by radiation oncology was made. Patient initially did have urinary incontinence had a male urethral sling placed in 2014 with excellent result. He does also have significant erectile dysfunction. He is having no bone pain at this time. He is undergone CT scan of chest abdomen pelvis showing no evidence of to suggest metastatic disease to his bone or recurrent disease in his pelvis.  PLANNED TREATMENT REGIMEN: Salvage radiation therapy with [facility] agonist  PAST MEDICAL HISTORY:  has a past medical history of History of splenectomy; Hyperlipidemia; Gastric ulcer; Cancer ([place]) (2006); Bleeding disorder ([place]); Depression; GERD (gastroesophageal reflux disease); Erectile dysfunction; Urinary stress incontinence, male; and Hematuria.    PAST SURGICAL HISTORY:  Past Surgical History  Procedure Laterality Date  . Splenectomy  1953    after trauma, fell out tree  .  Knee arthroscopy N/A 1969    meniscal tear  . Prostatectomy  2007    removal of prostate  . Ventral hernia repair    . Incontinence surgery      FAMILY HISTORY: family history includes Alcohol abuse in his father; Dementia in his mother; Glaucoma in his sister; Heart disease in his brother; Multiple sclerosis in his brother. There is no history of Kidney disease or Prostate cancer.  SOCIAL HISTORY:  reports that he quit smoking about 37 years ago. He does not have any smokeless tobacco history on file. He reports that he drinks alcohol. He reports that he does not use illicit drugs.  ALLERGIES: Review of patient's allergies indicates no known allergies.  MEDICATIONS:  Current Outpatient Prescriptions  Medication Sig Dispense Refill  . betamethasone dipropionate (DIPROLENE) 0.05 % cream Apply topically as needed. Reported on 06/03/2015    . DULoxetine (CYMBALTA) 60 MG capsule Take 1 capsule (60 mg total) by mouth daily. 30 capsule 6  . Multiple Vitamin (MULTIVITAMIN) capsule Take 1 capsule by mouth daily.    . sildenafil (REVATIO) 20 MG tablet Take 20 mg by mouth 3 (three) times daily as needed. Reported on 06/03/2015    . valACYclovir (VALTREX) 1000 MG tablet Take 1 tablet (1,000 mg total) by mouth 2 (two) times daily. 14 tablet 3   No current facility-administered medications for this encounter.    ECOG PERFORMANCE STATUS:  0 - Asymptomatic  REVIEW OF SYSTEMS:  Patient denies any weight loss, fatigue, weakness, fever, chills or night sweats. Patient denies any loss of vision, blurred vision. Patient denies any ringing  of the ears or hearing loss.  No irregular heartbeat. Patient denies heart murmur or history of fainting. Patient denies any chest pain or pain radiating to her upper extremities. Patient denies any shortness of breath, difficulty breathing at night, cough or hemoptysis. Patient denies any swelling in the lower legs. Patient denies any nausea vomiting, vomiting of blood, or  coffee ground material in the vomitus. Patient denies any stomach pain. Patient states has had normal bowel movements no significant constipation or diarrhea. Patient denies any dysuria, hematuria or significant nocturia. Patient denies any problems walking, swelling in the joints or loss of balance. Patient denies any skin changes, loss of hair or loss of weight. Patient denies any excessive worrying or anxiety or significant depression. Patient denies any problems with insomnia. Patient denies excessive thirst, polyuria, polydipsia. Patient denies any swollen glands, patient denies easy bruising or easy bleeding. Patient denies any recent infections, allergies or URI. Patient "s visual fields have not changed significantly in recent time.    PHYSICAL EXAM: BP 137/85 mmHg  Pulse 75  Temp(Src) 96.7 F (35.9 C)  Resp 18  Wt 218 lb 9.4 oz (99.15 kg) On rectal exam rectal sphincter tone is good prostatic fossa is clear without evidence of nodularity or mass. No other rectal abnormality is identified. Well-developed well-nourished patient in NAD. HEENT reveals PERLA, EOMI, discs not visualized.  Oral cavity is clear. No oral mucosal lesions are identified. Neck is clear without evidence of cervical or supraclavicular adenopathy. Lungs are clear to A&P. Cardiac examination is essentially unremarkable with regular rate and rhythm without murmur rub or thrill. Abdomen is benign with no organomegaly or masses noted. Motor sensory and DTR levels are equal and symmetric in the upper and lower extremities. Cranial nerves II through XII are grossly intact. Proprioception is intact. No peripheral adenopathy or edema is identified. No motor or sensory levels are noted. Crude visual fields are within normal range.  LABORATORY DATA: I'm requesting from urology's office original pathology report for my review    RADIOLOGY RESULTS: CT scans chest abdomen pelvis reviewed   IMPRESSION: Recurrent biochemical failure  of prostate cancer status post radical prostatectomy in 2007  PLAN: At this time I to go ahead with salvage radiation therapy. I will review his pathology before making definitive choice on whether to include not only his prostatic fossa but his pelvic lymph nodes. I tend to dose escalate up to 7600 cGy in salvage radiation therapy for prostate cancer which we have been performing for years without significant side effect with excellent clinical results. Risks and benefits of treatment including worsening of erectile dysfunction, increasing lower urinary tract symptoms, possible diarrhea fatigue alteration of blood counts all were described in detail to the patient and his wife. They both seem to comprehend my treatment plan well. I have set up and ordered CT simulation for later this week. Recent literature also suggests benefit to [facility] agonist during radiation therapy in salvage mode and I have recommended 1 year of Lupron suppression in this patient. I've explained to him the risks and benefits of Lupron in the recent data concerning increase survival in this group of patients.  I would like to take this opportunity for allowing me to participate in the care of your patient.Armstead Peaks., MD

## 2015-06-18 ENCOUNTER — Ambulatory Visit
Admission: RE | Admit: 2015-06-18 | Discharge: 2015-06-18 | Disposition: A | Discharge status: Home / Self Care | Payer: PPO | Source: Ambulatory Visit | Attending: Radiation Oncology | Admitting: Radiation Oncology

## 2015-06-18 DIAGNOSIS — C61 Malignant neoplasm of prostate: Secondary | ICD-10-CM | POA: Diagnosis not present

## 2015-06-18 DIAGNOSIS — Z510 Encounter for antineoplastic radiation therapy: Secondary | ICD-10-CM | POA: Diagnosis not present

## 2015-06-26 DIAGNOSIS — C61 Malignant neoplasm of prostate: Secondary | ICD-10-CM | POA: Diagnosis not present

## 2015-06-26 DIAGNOSIS — Z510 Encounter for antineoplastic radiation therapy: Secondary | ICD-10-CM | POA: Diagnosis not present

## 2015-06-27 DIAGNOSIS — C61 Malignant neoplasm of prostate: Secondary | ICD-10-CM | POA: Diagnosis not present

## 2015-06-27 DIAGNOSIS — Z510 Encounter for antineoplastic radiation therapy: Secondary | ICD-10-CM | POA: Diagnosis not present

## 2015-06-30 ENCOUNTER — Ambulatory Visit: Payer: PPO

## 2015-07-01 ENCOUNTER — Ambulatory Visit: Payer: PPO

## 2015-07-02 ENCOUNTER — Ambulatory Visit: Payer: PPO

## 2015-07-03 ENCOUNTER — Ambulatory Visit: Payer: PPO

## 2015-07-03 DIAGNOSIS — Z510 Encounter for antineoplastic radiation therapy: Secondary | ICD-10-CM | POA: Diagnosis not present

## 2015-07-03 DIAGNOSIS — C61 Malignant neoplasm of prostate: Secondary | ICD-10-CM | POA: Diagnosis not present

## 2015-07-04 ENCOUNTER — Ambulatory Visit: Payer: PPO

## 2015-07-04 ENCOUNTER — Other Ambulatory Visit: Payer: Self-pay | Admitting: *Deleted

## 2015-07-04 DIAGNOSIS — C61 Malignant neoplasm of prostate: Secondary | ICD-10-CM

## 2015-07-07 ENCOUNTER — Ambulatory Visit
Admission: RE | Admit: 2015-07-07 | Discharge: 2015-07-07 | Disposition: A | Discharge status: Home / Self Care | Payer: PPO | Source: Ambulatory Visit | Attending: Radiation Oncology | Admitting: Radiation Oncology

## 2015-07-07 ENCOUNTER — Ambulatory Visit: Payer: PPO

## 2015-07-07 ENCOUNTER — Other Ambulatory Visit: Payer: Self-pay | Admitting: *Deleted

## 2015-07-07 DIAGNOSIS — C61 Malignant neoplasm of prostate: Secondary | ICD-10-CM

## 2015-07-08 ENCOUNTER — Ambulatory Visit: Payer: PPO

## 2015-07-08 ENCOUNTER — Ambulatory Visit
Admission: RE | Admit: 2015-07-08 | Discharge: 2015-07-08 | Disposition: A | Discharge status: Home / Self Care | Payer: PPO | Source: Ambulatory Visit | Attending: Radiation Oncology | Admitting: Radiation Oncology

## 2015-07-08 ENCOUNTER — Inpatient Hospital Stay: Payer: PPO | Attending: Radiation Oncology

## 2015-07-08 VITALS — BP 142/82 | HR 87 | Temp 98.3°F | Resp 20

## 2015-07-08 DIAGNOSIS — C61 Malignant neoplasm of prostate: Secondary | ICD-10-CM | POA: Diagnosis not present

## 2015-07-08 DIAGNOSIS — Z510 Encounter for antineoplastic radiation therapy: Secondary | ICD-10-CM | POA: Diagnosis not present

## 2015-07-08 DIAGNOSIS — Z79818 Long term (current) use of other agents affecting estrogen receptors and estrogen levels: Secondary | ICD-10-CM | POA: Insufficient documentation

## 2015-07-08 MED ORDER — LEUPROLIDE ACETATE (4 MONTH) 30 MG IM KIT
30.0000 mg | PACK | Freq: Once | INTRAMUSCULAR | Status: AC
  Administered 2015-07-08: 30 mg via INTRAMUSCULAR

## 2015-07-09 ENCOUNTER — Ambulatory Visit: Payer: PPO

## 2015-07-09 ENCOUNTER — Ambulatory Visit
Admission: RE | Admit: 2015-07-09 | Discharge: 2015-07-09 | Disposition: A | Discharge status: Home / Self Care | Payer: PPO | Source: Ambulatory Visit | Attending: Radiation Oncology | Admitting: Radiation Oncology

## 2015-07-09 DIAGNOSIS — C61 Malignant neoplasm of prostate: Secondary | ICD-10-CM | POA: Diagnosis not present

## 2015-07-09 DIAGNOSIS — Z510 Encounter for antineoplastic radiation therapy: Secondary | ICD-10-CM | POA: Diagnosis not present

## 2015-07-10 ENCOUNTER — Ambulatory Visit: Payer: PPO

## 2015-07-10 ENCOUNTER — Ambulatory Visit
Admission: RE | Admit: 2015-07-10 | Discharge: 2015-07-10 | Disposition: A | Discharge status: Home / Self Care | Payer: PPO | Source: Ambulatory Visit | Attending: Radiation Oncology | Admitting: Radiation Oncology

## 2015-07-10 DIAGNOSIS — C61 Malignant neoplasm of prostate: Secondary | ICD-10-CM | POA: Diagnosis not present

## 2015-07-10 DIAGNOSIS — Z510 Encounter for antineoplastic radiation therapy: Secondary | ICD-10-CM | POA: Diagnosis not present

## 2015-07-11 ENCOUNTER — Ambulatory Visit: Payer: PPO

## 2015-07-11 ENCOUNTER — Ambulatory Visit
Admission: RE | Admit: 2015-07-11 | Discharge: 2015-07-11 | Disposition: A | Discharge status: Home / Self Care | Payer: PPO | Source: Ambulatory Visit | Attending: Radiation Oncology | Admitting: Radiation Oncology

## 2015-07-11 DIAGNOSIS — C61 Malignant neoplasm of prostate: Secondary | ICD-10-CM | POA: Diagnosis not present

## 2015-07-11 DIAGNOSIS — Z510 Encounter for antineoplastic radiation therapy: Secondary | ICD-10-CM | POA: Diagnosis not present

## 2015-07-14 ENCOUNTER — Ambulatory Visit
Admission: RE | Admit: 2015-07-14 | Discharge: 2015-07-14 | Disposition: A | Discharge status: Home / Self Care | Payer: PPO | Source: Ambulatory Visit | Attending: Radiation Oncology | Admitting: Radiation Oncology

## 2015-07-14 DIAGNOSIS — Z510 Encounter for antineoplastic radiation therapy: Secondary | ICD-10-CM | POA: Diagnosis not present

## 2015-07-14 DIAGNOSIS — C61 Malignant neoplasm of prostate: Secondary | ICD-10-CM | POA: Diagnosis not present

## 2015-07-15 ENCOUNTER — Ambulatory Visit
Admission: RE | Admit: 2015-07-15 | Discharge: 2015-07-15 | Disposition: A | Discharge status: Home / Self Care | Payer: PPO | Source: Ambulatory Visit | Attending: Radiation Oncology | Admitting: Radiation Oncology

## 2015-07-15 DIAGNOSIS — C61 Malignant neoplasm of prostate: Secondary | ICD-10-CM | POA: Diagnosis not present

## 2015-07-15 DIAGNOSIS — Z510 Encounter for antineoplastic radiation therapy: Secondary | ICD-10-CM | POA: Diagnosis not present

## 2015-07-16 ENCOUNTER — Ambulatory Visit
Admission: RE | Admit: 2015-07-16 | Discharge: 2015-07-16 | Disposition: A | Discharge status: Home / Self Care | Payer: PPO | Source: Ambulatory Visit | Attending: Radiation Oncology | Admitting: Radiation Oncology

## 2015-07-16 DIAGNOSIS — Z510 Encounter for antineoplastic radiation therapy: Secondary | ICD-10-CM | POA: Diagnosis not present

## 2015-07-16 DIAGNOSIS — C61 Malignant neoplasm of prostate: Secondary | ICD-10-CM | POA: Diagnosis not present

## 2015-07-17 ENCOUNTER — Ambulatory Visit
Admission: RE | Admit: 2015-07-17 | Discharge: 2015-07-17 | Disposition: A | Discharge status: Home / Self Care | Payer: PPO | Source: Ambulatory Visit | Attending: Radiation Oncology | Admitting: Radiation Oncology

## 2015-07-17 DIAGNOSIS — Z510 Encounter for antineoplastic radiation therapy: Secondary | ICD-10-CM | POA: Diagnosis not present

## 2015-07-17 DIAGNOSIS — C61 Malignant neoplasm of prostate: Secondary | ICD-10-CM | POA: Diagnosis not present

## 2015-07-18 ENCOUNTER — Ambulatory Visit
Admission: RE | Admit: 2015-07-18 | Discharge: 2015-07-18 | Disposition: A | Discharge status: Home / Self Care | Payer: PPO | Source: Ambulatory Visit | Attending: Radiation Oncology | Admitting: Radiation Oncology

## 2015-07-18 DIAGNOSIS — Z510 Encounter for antineoplastic radiation therapy: Secondary | ICD-10-CM | POA: Diagnosis not present

## 2015-07-18 DIAGNOSIS — C61 Malignant neoplasm of prostate: Secondary | ICD-10-CM | POA: Diagnosis not present

## 2015-07-19 ENCOUNTER — Encounter: Payer: Self-pay | Admitting: Internal Medicine

## 2015-07-21 ENCOUNTER — Ambulatory Visit
Admission: RE | Admit: 2015-07-21 | Discharge: 2015-07-21 | Disposition: A | Discharge status: Home / Self Care | Payer: PPO | Source: Ambulatory Visit | Attending: Radiation Oncology | Admitting: Radiation Oncology

## 2015-07-21 ENCOUNTER — Inpatient Hospital Stay: Payer: PPO | Attending: Radiation Oncology

## 2015-07-21 DIAGNOSIS — C61 Malignant neoplasm of prostate: Secondary | ICD-10-CM | POA: Diagnosis not present

## 2015-07-21 DIAGNOSIS — Z510 Encounter for antineoplastic radiation therapy: Secondary | ICD-10-CM | POA: Diagnosis not present

## 2015-07-21 LAB — CBC
HEMATOCRIT: 41.7 % (ref 40.0–52.0)
HEMOGLOBIN: 14.4 g/dL (ref 13.0–18.0)
MCH: 31 pg (ref 26.0–34.0)
MCHC: 34.5 g/dL (ref 32.0–36.0)
MCV: 90 fL (ref 80.0–100.0)
Platelets: 224 10*3/uL (ref 150–440)
RBC: 4.64 MIL/uL (ref 4.40–5.90)
RDW: 13.6 % (ref 11.5–14.5)
WBC: 6.3 10*3/uL (ref 3.8–10.6)

## 2015-07-22 ENCOUNTER — Other Ambulatory Visit: Payer: Self-pay | Admitting: *Deleted

## 2015-07-22 ENCOUNTER — Ambulatory Visit
Admission: RE | Admit: 2015-07-22 | Discharge: 2015-07-22 | Disposition: A | Discharge status: Home / Self Care | Payer: PPO | Source: Ambulatory Visit | Attending: Radiation Oncology | Admitting: Radiation Oncology

## 2015-07-22 DIAGNOSIS — Z510 Encounter for antineoplastic radiation therapy: Secondary | ICD-10-CM | POA: Diagnosis not present

## 2015-07-22 DIAGNOSIS — C61 Malignant neoplasm of prostate: Secondary | ICD-10-CM

## 2015-07-22 MED ORDER — TAMSULOSIN HCL 0.4 MG PO CAPS: 0.4000 mg | ORAL_CAPSULE | Freq: Every day | ORAL | Status: DC

## 2015-07-23 ENCOUNTER — Ambulatory Visit
Admission: RE | Admit: 2015-07-23 | Discharge: 2015-07-23 | Disposition: A | Discharge status: Home / Self Care | Payer: PPO | Source: Ambulatory Visit | Attending: Radiation Oncology | Admitting: Radiation Oncology

## 2015-07-23 DIAGNOSIS — Z510 Encounter for antineoplastic radiation therapy: Secondary | ICD-10-CM | POA: Diagnosis not present

## 2015-07-23 DIAGNOSIS — C61 Malignant neoplasm of prostate: Secondary | ICD-10-CM | POA: Diagnosis not present

## 2015-07-24 ENCOUNTER — Ambulatory Visit
Admission: RE | Admit: 2015-07-24 | Discharge: 2015-07-24 | Disposition: A | Discharge status: Home / Self Care | Payer: PPO | Source: Ambulatory Visit | Attending: Radiation Oncology | Admitting: Radiation Oncology

## 2015-07-24 DIAGNOSIS — C61 Malignant neoplasm of prostate: Secondary | ICD-10-CM | POA: Diagnosis not present

## 2015-07-24 DIAGNOSIS — Z510 Encounter for antineoplastic radiation therapy: Secondary | ICD-10-CM | POA: Diagnosis not present

## 2015-07-25 ENCOUNTER — Ambulatory Visit
Admission: RE | Admit: 2015-07-25 | Discharge: 2015-07-25 | Disposition: A | Discharge status: Home / Self Care | Payer: PPO | Source: Ambulatory Visit | Attending: Radiation Oncology | Admitting: Radiation Oncology

## 2015-07-25 DIAGNOSIS — Z510 Encounter for antineoplastic radiation therapy: Secondary | ICD-10-CM | POA: Diagnosis not present

## 2015-07-25 DIAGNOSIS — C61 Malignant neoplasm of prostate: Secondary | ICD-10-CM | POA: Diagnosis not present

## 2015-07-28 ENCOUNTER — Ambulatory Visit
Admission: RE | Admit: 2015-07-28 | Discharge: 2015-07-28 | Disposition: A | Discharge status: Home / Self Care | Payer: PPO | Source: Ambulatory Visit | Attending: Radiation Oncology | Admitting: Radiation Oncology

## 2015-07-28 DIAGNOSIS — C61 Malignant neoplasm of prostate: Secondary | ICD-10-CM | POA: Diagnosis not present

## 2015-07-28 DIAGNOSIS — Z510 Encounter for antineoplastic radiation therapy: Secondary | ICD-10-CM | POA: Diagnosis not present

## 2015-07-29 ENCOUNTER — Ambulatory Visit
Admission: RE | Admit: 2015-07-29 | Discharge: 2015-07-29 | Disposition: A | Discharge status: Home / Self Care | Payer: PPO | Source: Ambulatory Visit | Attending: Radiation Oncology | Admitting: Radiation Oncology

## 2015-07-29 DIAGNOSIS — Z510 Encounter for antineoplastic radiation therapy: Secondary | ICD-10-CM | POA: Diagnosis not present

## 2015-07-29 DIAGNOSIS — C61 Malignant neoplasm of prostate: Secondary | ICD-10-CM | POA: Diagnosis not present

## 2015-07-30 ENCOUNTER — Ambulatory Visit
Admission: RE | Admit: 2015-07-30 | Discharge: 2015-07-30 | Disposition: A | Discharge status: Home / Self Care | Payer: PPO | Source: Ambulatory Visit | Attending: Radiation Oncology | Admitting: Radiation Oncology

## 2015-07-30 DIAGNOSIS — Z510 Encounter for antineoplastic radiation therapy: Secondary | ICD-10-CM | POA: Diagnosis not present

## 2015-07-30 DIAGNOSIS — C61 Malignant neoplasm of prostate: Secondary | ICD-10-CM | POA: Diagnosis not present

## 2015-07-31 ENCOUNTER — Ambulatory Visit
Admission: RE | Admit: 2015-07-31 | Discharge: 2015-07-31 | Disposition: A | Discharge status: Home / Self Care | Payer: PPO | Source: Ambulatory Visit | Attending: Radiation Oncology | Admitting: Radiation Oncology

## 2015-07-31 DIAGNOSIS — C61 Malignant neoplasm of prostate: Secondary | ICD-10-CM | POA: Diagnosis not present

## 2015-07-31 DIAGNOSIS — Z510 Encounter for antineoplastic radiation therapy: Secondary | ICD-10-CM | POA: Diagnosis not present

## 2015-08-01 ENCOUNTER — Ambulatory Visit
Admission: RE | Admit: 2015-08-01 | Discharge: 2015-08-01 | Disposition: A | Discharge status: Home / Self Care | Payer: PPO | Source: Ambulatory Visit | Attending: Radiation Oncology | Admitting: Radiation Oncology

## 2015-08-01 DIAGNOSIS — C61 Malignant neoplasm of prostate: Secondary | ICD-10-CM | POA: Diagnosis not present

## 2015-08-01 DIAGNOSIS — Z510 Encounter for antineoplastic radiation therapy: Secondary | ICD-10-CM | POA: Diagnosis not present

## 2015-08-04 ENCOUNTER — Ambulatory Visit
Admission: RE | Admit: 2015-08-04 | Discharge: 2015-08-04 | Disposition: A | Discharge status: Home / Self Care | Payer: PPO | Source: Ambulatory Visit | Attending: Radiation Oncology | Admitting: Radiation Oncology

## 2015-08-04 ENCOUNTER — Inpatient Hospital Stay: Payer: PPO

## 2015-08-04 ENCOUNTER — Encounter: Payer: Self-pay | Admitting: Internal Medicine

## 2015-08-04 DIAGNOSIS — C61 Malignant neoplasm of prostate: Secondary | ICD-10-CM

## 2015-08-04 DIAGNOSIS — Z510 Encounter for antineoplastic radiation therapy: Secondary | ICD-10-CM | POA: Diagnosis not present

## 2015-08-04 LAB — CBC
HCT: 41.9 % (ref 40.0–52.0)
HEMOGLOBIN: 14.3 g/dL (ref 13.0–18.0)
MCH: 30.9 pg (ref 26.0–34.0)
MCHC: 34.1 g/dL (ref 32.0–36.0)
MCV: 90.6 fL (ref 80.0–100.0)
Platelets: 210 10*3/uL (ref 150–440)
RBC: 4.63 MIL/uL (ref 4.40–5.90)
RDW: 13.7 % (ref 11.5–14.5)
WBC: 6.1 10*3/uL (ref 3.8–10.6)

## 2015-08-05 ENCOUNTER — Ambulatory Visit
Admission: RE | Admit: 2015-08-05 | Discharge: 2015-08-05 | Disposition: A | Discharge status: Home / Self Care | Payer: PPO | Source: Ambulatory Visit | Attending: Radiation Oncology | Admitting: Radiation Oncology

## 2015-08-05 DIAGNOSIS — Z510 Encounter for antineoplastic radiation therapy: Secondary | ICD-10-CM | POA: Diagnosis not present

## 2015-08-05 DIAGNOSIS — C61 Malignant neoplasm of prostate: Secondary | ICD-10-CM | POA: Diagnosis not present

## 2015-08-06 ENCOUNTER — Ambulatory Visit
Admission: RE | Admit: 2015-08-06 | Discharge: 2015-08-06 | Disposition: A | Discharge status: Home / Self Care | Payer: PPO | Source: Ambulatory Visit | Attending: Radiation Oncology | Admitting: Radiation Oncology

## 2015-08-06 DIAGNOSIS — Z510 Encounter for antineoplastic radiation therapy: Secondary | ICD-10-CM | POA: Diagnosis not present

## 2015-08-06 DIAGNOSIS — C61 Malignant neoplasm of prostate: Secondary | ICD-10-CM | POA: Diagnosis not present

## 2015-08-07 ENCOUNTER — Other Ambulatory Visit: Payer: Self-pay | Admitting: *Deleted

## 2015-08-07 ENCOUNTER — Ambulatory Visit
Admission: RE | Admit: 2015-08-07 | Discharge: 2015-08-07 | Disposition: A | Discharge status: Home / Self Care | Payer: PPO | Source: Ambulatory Visit | Attending: Radiation Oncology | Admitting: Radiation Oncology

## 2015-08-07 DIAGNOSIS — Z510 Encounter for antineoplastic radiation therapy: Secondary | ICD-10-CM | POA: Diagnosis not present

## 2015-08-07 DIAGNOSIS — C61 Malignant neoplasm of prostate: Secondary | ICD-10-CM | POA: Diagnosis not present

## 2015-08-07 MED ORDER — CIPROFLOXACIN HCL 500 MG PO TABS: 500.0000 mg | ORAL_TABLET | Freq: Two times a day (BID) | ORAL | Status: DC

## 2015-08-08 ENCOUNTER — Ambulatory Visit
Admission: RE | Admit: 2015-08-08 | Discharge: 2015-08-08 | Disposition: A | Discharge status: Home / Self Care | Payer: PPO | Source: Ambulatory Visit | Attending: Radiation Oncology | Admitting: Radiation Oncology

## 2015-08-08 ENCOUNTER — Ambulatory Visit: Payer: PPO

## 2015-08-08 DIAGNOSIS — C61 Malignant neoplasm of prostate: Secondary | ICD-10-CM | POA: Diagnosis not present

## 2015-08-08 DIAGNOSIS — Z510 Encounter for antineoplastic radiation therapy: Secondary | ICD-10-CM | POA: Diagnosis not present

## 2015-08-11 ENCOUNTER — Ambulatory Visit
Admission: RE | Admit: 2015-08-11 | Discharge: 2015-08-11 | Disposition: A | Discharge status: Home / Self Care | Payer: PPO | Source: Ambulatory Visit | Attending: Radiation Oncology | Admitting: Radiation Oncology

## 2015-08-11 DIAGNOSIS — Z510 Encounter for antineoplastic radiation therapy: Secondary | ICD-10-CM | POA: Diagnosis not present

## 2015-08-11 DIAGNOSIS — C61 Malignant neoplasm of prostate: Secondary | ICD-10-CM | POA: Diagnosis not present

## 2015-08-12 ENCOUNTER — Ambulatory Visit
Admission: RE | Admit: 2015-08-12 | Discharge: 2015-08-12 | Disposition: A | Discharge status: Home / Self Care | Payer: PPO | Source: Ambulatory Visit | Attending: Radiation Oncology | Admitting: Radiation Oncology

## 2015-08-12 DIAGNOSIS — C61 Malignant neoplasm of prostate: Secondary | ICD-10-CM | POA: Diagnosis not present

## 2015-08-12 DIAGNOSIS — Z510 Encounter for antineoplastic radiation therapy: Secondary | ICD-10-CM | POA: Diagnosis not present

## 2015-08-13 ENCOUNTER — Ambulatory Visit
Admission: RE | Admit: 2015-08-13 | Discharge: 2015-08-13 | Disposition: A | Discharge status: Home / Self Care | Payer: PPO | Source: Ambulatory Visit | Attending: Radiation Oncology | Admitting: Radiation Oncology

## 2015-08-13 DIAGNOSIS — C61 Malignant neoplasm of prostate: Secondary | ICD-10-CM | POA: Diagnosis not present

## 2015-08-13 DIAGNOSIS — Z510 Encounter for antineoplastic radiation therapy: Secondary | ICD-10-CM | POA: Diagnosis not present

## 2015-08-14 ENCOUNTER — Ambulatory Visit
Admission: RE | Admit: 2015-08-14 | Discharge: 2015-08-14 | Disposition: A | Discharge status: Home / Self Care | Payer: PPO | Source: Ambulatory Visit | Attending: Radiation Oncology | Admitting: Radiation Oncology

## 2015-08-14 DIAGNOSIS — C61 Malignant neoplasm of prostate: Secondary | ICD-10-CM | POA: Diagnosis not present

## 2015-08-14 DIAGNOSIS — Z510 Encounter for antineoplastic radiation therapy: Secondary | ICD-10-CM | POA: Diagnosis not present

## 2015-08-15 ENCOUNTER — Ambulatory Visit
Admission: RE | Admit: 2015-08-15 | Discharge: 2015-08-15 | Disposition: A | Discharge status: Home / Self Care | Payer: PPO | Source: Ambulatory Visit | Attending: Radiation Oncology | Admitting: Radiation Oncology

## 2015-08-15 DIAGNOSIS — C61 Malignant neoplasm of prostate: Secondary | ICD-10-CM | POA: Diagnosis not present

## 2015-08-15 DIAGNOSIS — Z510 Encounter for antineoplastic radiation therapy: Secondary | ICD-10-CM | POA: Diagnosis not present

## 2015-08-18 ENCOUNTER — Inpatient Hospital Stay: Payer: PPO | Attending: Radiation Oncology

## 2015-08-18 ENCOUNTER — Ambulatory Visit
Admission: RE | Admit: 2015-08-18 | Discharge: 2015-08-18 | Disposition: A | Discharge status: Home / Self Care | Payer: PPO | Source: Ambulatory Visit | Attending: Radiation Oncology | Admitting: Radiation Oncology

## 2015-08-18 DIAGNOSIS — Z510 Encounter for antineoplastic radiation therapy: Secondary | ICD-10-CM | POA: Diagnosis not present

## 2015-08-18 DIAGNOSIS — C61 Malignant neoplasm of prostate: Secondary | ICD-10-CM | POA: Insufficient documentation

## 2015-08-18 LAB — CBC
HEMATOCRIT: 40.8 % (ref 40.0–52.0)
HEMOGLOBIN: 14 g/dL (ref 13.0–18.0)
MCH: 30.9 pg (ref 26.0–34.0)
MCHC: 34.4 g/dL (ref 32.0–36.0)
MCV: 89.9 fL (ref 80.0–100.0)
Platelets: 229 10*3/uL (ref 150–440)
RBC: 4.53 MIL/uL (ref 4.40–5.90)
RDW: 13.5 % (ref 11.5–14.5)
WBC: 6.1 10*3/uL (ref 3.8–10.6)

## 2015-08-19 ENCOUNTER — Ambulatory Visit
Admission: RE | Admit: 2015-08-19 | Discharge: 2015-08-19 | Disposition: A | Discharge status: Home / Self Care | Payer: PPO | Source: Ambulatory Visit | Attending: Radiation Oncology | Admitting: Radiation Oncology

## 2015-08-19 DIAGNOSIS — Z510 Encounter for antineoplastic radiation therapy: Secondary | ICD-10-CM | POA: Diagnosis not present

## 2015-08-19 DIAGNOSIS — C61 Malignant neoplasm of prostate: Secondary | ICD-10-CM | POA: Diagnosis not present

## 2015-08-20 ENCOUNTER — Ambulatory Visit
Admission: RE | Admit: 2015-08-20 | Discharge: 2015-08-20 | Disposition: A | Discharge status: Home / Self Care | Payer: PPO | Source: Ambulatory Visit | Attending: Radiation Oncology | Admitting: Radiation Oncology

## 2015-08-20 DIAGNOSIS — C61 Malignant neoplasm of prostate: Secondary | ICD-10-CM | POA: Diagnosis not present

## 2015-08-20 DIAGNOSIS — Z510 Encounter for antineoplastic radiation therapy: Secondary | ICD-10-CM | POA: Diagnosis not present

## 2015-08-21 ENCOUNTER — Ambulatory Visit
Admission: RE | Admit: 2015-08-21 | Discharge: 2015-08-21 | Disposition: A | Discharge status: Home / Self Care | Payer: PPO | Source: Ambulatory Visit | Attending: Radiation Oncology | Admitting: Radiation Oncology

## 2015-08-21 DIAGNOSIS — C61 Malignant neoplasm of prostate: Secondary | ICD-10-CM | POA: Diagnosis not present

## 2015-08-21 DIAGNOSIS — Z510 Encounter for antineoplastic radiation therapy: Secondary | ICD-10-CM | POA: Diagnosis not present

## 2015-08-22 ENCOUNTER — Ambulatory Visit
Admission: RE | Admit: 2015-08-22 | Discharge: 2015-08-22 | Disposition: A | Discharge status: Home / Self Care | Payer: PPO | Source: Ambulatory Visit | Attending: Radiation Oncology | Admitting: Radiation Oncology

## 2015-08-22 DIAGNOSIS — C61 Malignant neoplasm of prostate: Secondary | ICD-10-CM | POA: Diagnosis not present

## 2015-08-22 DIAGNOSIS — Z510 Encounter for antineoplastic radiation therapy: Secondary | ICD-10-CM | POA: Diagnosis not present

## 2015-08-25 ENCOUNTER — Ambulatory Visit: Payer: PPO

## 2015-08-26 ENCOUNTER — Ambulatory Visit: Payer: PPO

## 2015-08-27 ENCOUNTER — Ambulatory Visit: Payer: PPO

## 2015-08-28 ENCOUNTER — Ambulatory Visit: Payer: PPO

## 2015-09-25 ENCOUNTER — Ambulatory Visit
Admission: RE | Admit: 2015-09-25 | Discharge: 2015-09-25 | Disposition: A | Discharge status: Home / Self Care | Payer: PPO | Source: Ambulatory Visit | Attending: Radiation Oncology | Admitting: Radiation Oncology

## 2015-09-25 ENCOUNTER — Other Ambulatory Visit: Payer: Self-pay | Admitting: *Deleted

## 2015-09-25 ENCOUNTER — Encounter: Payer: Self-pay | Admitting: Radiation Oncology

## 2015-09-25 VITALS — BP 131/78 | HR 85 | Temp 96.2°F | Resp 20 | Wt 216.4 lb

## 2015-09-25 DIAGNOSIS — C61 Malignant neoplasm of prostate: Secondary | ICD-10-CM

## 2015-09-25 DIAGNOSIS — Z538 Procedure and treatment not carried out for other reasons: Secondary | ICD-10-CM | POA: Insufficient documentation

## 2015-09-25 NOTE — Progress Notes (Signed)
Radiation Oncology Follow up Note  Name: Benjamin Mills   Date:   09/25/2015 MRN:  [id number] DOB: 1945/06/07    This 70 y.o. male presents to the clinic today for one-month follow-up for salvage radiation therapy for prostate cancer.  REFERRING PROVIDER: Jackolyn Confer, MD  HPI: Patient is a 70 year old male now out 1 month having completed salvage radiation therapy status post radical robotic-assisted prostatectomy in 2007 for a Gleason 7 (3+4). He had biochemical failure. Has also known history of urinary incontinence with a male urethral sling placed in 2014. He also has erectile dysfunction. He is seen today in routine follow-up is doing well specifically denies diarrhea dysuria or any other GI/GU complaints..  COMPLICATIONS OF TREATMENT: none  FOLLOW UP COMPLIANCE: keeps appointments   PHYSICAL EXAM:  BP 131/78 mmHg  Pulse 85  Temp(Src) 96.2 F (35.7 C)  Resp 20  Wt 216 lb 6.1 oz (98.15 kg) On rectal exam rectal sphincter tone is good prostatic fossa is clear without evidence of nodularity or mass. No other rectal abnormalities are identified. Well-developed well-nourished patient in NAD. HEENT reveals PERLA, EOMI, discs not visualized.  Oral cavity is clear. No oral mucosal lesions are identified. Neck is clear without evidence of cervical or supraclavicular adenopathy. Lungs are clear to A&P. Cardiac examination is essentially unremarkable with regular rate and rhythm without murmur rub or thrill. Abdomen is benign with no organomegaly or masses noted. Motor sensory and DTR levels are equal and symmetric in the upper and lower extremities. Cranial nerves II through XII are grossly intact. Proprioception is intact. No peripheral adenopathy or edema is identified. No motor or sensory levels are noted. Crude visual fields are within normal range.  RADIOLOGY RESULTS: No current films for review  PLAN: Patient continues to do well with no smoking side effects from his salvage  radiation therapy. He continues to be suppressed on Lupron. I've asked to see him back in 3 months will obtain a PSA at that time. Patient knows to call sooner with any concerns.  I would like to take this opportunity to thank you for allowing me to participate in the care of your patient.Armstead Peaks., MD

## 2015-09-30 DIAGNOSIS — H2513 Age-related nuclear cataract, bilateral: Secondary | ICD-10-CM | POA: Diagnosis not present

## 2015-10-01 ENCOUNTER — Other Ambulatory Visit: Payer: Self-pay | Admitting: *Deleted

## 2015-10-01 DIAGNOSIS — C61 Malignant neoplasm of prostate: Secondary | ICD-10-CM

## 2015-10-11 ENCOUNTER — Other Ambulatory Visit: Payer: Self-pay | Admitting: Internal Medicine

## 2015-10-27 ENCOUNTER — Ambulatory Visit: Payer: Self-pay | Admitting: Internal Medicine

## 2015-11-05 ENCOUNTER — Encounter: Payer: Self-pay | Admitting: Family Medicine

## 2015-11-05 ENCOUNTER — Ambulatory Visit (INDEPENDENT_AMBULATORY_CARE_PROVIDER_SITE_OTHER): Payer: PPO | Admitting: Family Medicine

## 2015-11-05 DIAGNOSIS — E785 Hyperlipidemia, unspecified: Secondary | ICD-10-CM | POA: Diagnosis not present

## 2015-11-05 DIAGNOSIS — L989 Disorder of the skin and subcutaneous tissue, unspecified: Secondary | ICD-10-CM

## 2015-11-05 DIAGNOSIS — F4323 Adjustment disorder with mixed anxiety and depressed mood: Secondary | ICD-10-CM

## 2015-11-05 HISTORY — DX: Disorder of the skin and subcutaneous tissue, unspecified: L98.9

## 2015-11-05 MED ORDER — DULOXETINE HCL 60 MG PO CPEP: 60.0000 mg | ORAL_CAPSULE | Freq: Every day | ORAL | 3 refills | Status: DC

## 2015-11-05 NOTE — Assessment & Plan Note (Addendum)
Established problem, uncontrolled. Discussed treatment with statin given ASCVD risk score of 17.7%. Recommended statin therapy. Patient to consider this.

## 2015-11-05 NOTE — Patient Instructions (Signed)
Continue your current medications.  Follow up in 6 months.   Take care  Dr. Shahram Alexopoulos  

## 2015-11-05 NOTE — Progress Notes (Signed)
Subjective:  Patient ID: SHIRO WOBIG, male    DOB: [birth year]  Age: 70 y.o. MRN: [id number]  CC: Medication refill, Check moles  HPI:  70 year old male with a past medal history of hyperlipidemia, prostate cancers s/p prostatectomy and now receiving radiation, adjustment disorder, ED presents for medication refill. Also would like some "moles" checked.  Adjustment disorder  Stable on Cymbalta.  Requesting refill today.  Moles  Patient has several areas on his back that were recently noticed by his wife.  Given history of cancer, patient is quite concerned about these areas and would like them examined today.  He has not been able to see them given the location. No reports of color change or worsening per his wife.  Hyperlipidemia  Uncontrolled.  Not on statin therapy.  ASCVD risk score 17.7%.  Will discuss today.  Social Hx   Social History   Social History  . Marital status: Married    Spouse name: N/A  . Number of children: N/A  . Years of education: N/A   Social History Main Topics  . Smoking status: Former Smoker    Quit date: 06/05/1978  . Smokeless tobacco: None  . Alcohol use 0.0 oz/week     Comment: OCC  . Drug use: No  . Sexual activity: Not Asked   Other Topics Concern  . None   Social History Narrative   Born in [place].   Lived in [place].   Moved to [place], brother lives in [place].      Has 2 brothers and 1 sister.      Lives with wife in [place]. Has 3 daughters.      Work - retired, Surveyor, quantity      Diet - regular      Exercise - walks occasionally   Review of Systems  Constitutional: Negative.   Skin:       Skin lesions.   Objective:  BP 130/82 (BP Location: Left Arm, Patient Position: Sitting, Cuff Size: Normal)   Pulse 84   Temp 98.1 F (36.7 C) (Oral)   Wt 216 lb 8 oz (98.2 kg)   SpO2 97%   BMI 32.92 kg/m   BP/Weight 11/05/2015 09/25/2015 [id number]  Systolic BP [id number]  Diastolic BP 82 78 82  Wt. (Lbs)  216.5 216.38 -  BMI 32.92 32.91 -   Physical Exam  Constitutional: He is oriented to person, place, and time. He appears well-developed. No distress.  Pulmonary/Chest: Effort normal.  Neurological: He is alert and oriented to person, place, and time.  Skin:  Back - Patient has a few seborrheic keratoses. Patient has several nevi which appear benign.  Psychiatric: He has a normal mood and affect.  Vitals reviewed.  Lab Results  Component Value Date   WBC 6.1 08/18/2015   HGB 14.0 08/18/2015   HCT 40.8 08/18/2015   PLT 229 08/18/2015   GLUCOSE 91 06/05/2014   CHOL 181 06/05/2014   TRIG 81.0 06/05/2014   HDL 49.40 06/05/2014   LDLCALC 115 (H) 06/05/2014   ALT 14 06/05/2014   AST 18 06/05/2014   NA 137 06/05/2014   K 4.4 06/05/2014   CL 106 06/05/2014   CREATININE 0.80 06/12/2015   BUN 17 06/05/2014   CO2 27 06/05/2014   TSH 0.69 06/05/2014   PSA 0.11 07/03/2014   Assessment & Plan:   Problem List Items Addressed This Visit    Hyperlipidemia    Established problem, uncontrolled. Discussed treatment with statin given ASCVD  risk score of 17.7%. Recommended statin therapy. Patient to consider this.      Adjustment disorder with mixed anxiety and depressed mood    Established problem, well controlled. Continue Cymbalta. Refilled today.      Benign skin lesion    New problem. Patient with several nevi on his back seborrheic keratoses. Lesions appear benign. No suspicious lesions at this time. Will continue to monitor closely.         Other Visit Diagnoses   None.     Meds ordered this encounter  Medications  . DULoxetine (CYMBALTA) 60 MG capsule    Sig: Take 1 capsule (60 mg total) by mouth daily.    Dispense:  90 capsule    Refill:  3    Patient needs to be seen by PCP before further refills.   Follow-up: 6 months  [place] DO [facility]

## 2015-11-05 NOTE — Assessment & Plan Note (Signed)
Established problem, well controlled. Continue Cymbalta. Refilled today.

## 2015-11-05 NOTE — Assessment & Plan Note (Signed)
New problem. Patient with several nevi on his back seborrheic keratoses. Lesions appear benign. No suspicious lesions at this time. Will continue to monitor closely.

## 2015-11-05 NOTE — Progress Notes (Signed)
Pre visit review using our clinic review tool, if applicable. No additional management support is needed unless otherwise documented below in the visit note. 

## 2015-11-07 ENCOUNTER — Inpatient Hospital Stay: Payer: PPO | Attending: Radiation Oncology

## 2015-11-07 DIAGNOSIS — C61 Malignant neoplasm of prostate: Secondary | ICD-10-CM | POA: Insufficient documentation

## 2015-11-07 DIAGNOSIS — Z79818 Long term (current) use of other agents affecting estrogen receptors and estrogen levels: Secondary | ICD-10-CM | POA: Diagnosis not present

## 2015-11-07 MED ORDER — LEUPROLIDE ACETATE (4 MONTH) 30 MG IM KIT
30.0000 mg | PACK | Freq: Once | INTRAMUSCULAR | Status: AC
  Administered 2015-11-07: 30 mg via INTRAMUSCULAR
  Filled 2015-11-07: qty 30

## 2015-11-21 ENCOUNTER — Encounter: Payer: Self-pay | Admitting: Urology

## 2015-11-23 ENCOUNTER — Encounter: Payer: Self-pay | Admitting: Radiation Oncology

## 2015-11-28 ENCOUNTER — Ambulatory Visit: Payer: PPO | Admitting: Urology

## 2015-11-28 DIAGNOSIS — L309 Dermatitis, unspecified: Secondary | ICD-10-CM | POA: Diagnosis not present

## 2015-12-04 ENCOUNTER — Ambulatory Visit: Payer: PPO | Admitting: Urology

## 2015-12-24 ENCOUNTER — Inpatient Hospital Stay: Payer: PPO | Attending: Radiation Oncology

## 2015-12-24 ENCOUNTER — Encounter: Payer: Self-pay | Admitting: Radiation Oncology

## 2015-12-24 ENCOUNTER — Other Ambulatory Visit: Payer: Self-pay | Admitting: *Deleted

## 2015-12-24 ENCOUNTER — Ambulatory Visit
Admission: RE | Admit: 2015-12-24 | Discharge: 2015-12-24 | Disposition: A | Discharge status: Home / Self Care | Payer: PPO | Source: Ambulatory Visit | Attending: Radiation Oncology | Admitting: Radiation Oncology

## 2015-12-24 ENCOUNTER — Other Ambulatory Visit: Payer: Self-pay

## 2015-12-24 VITALS — BP 131/85 | HR 75 | Temp 97.9°F | Resp 20 | Wt 216.2 lb

## 2015-12-24 DIAGNOSIS — C61 Malignant neoplasm of prostate: Secondary | ICD-10-CM | POA: Diagnosis not present

## 2015-12-24 DIAGNOSIS — Z923 Personal history of irradiation: Secondary | ICD-10-CM | POA: Insufficient documentation

## 2015-12-24 DIAGNOSIS — Z9079 Acquired absence of other genital organ(s): Secondary | ICD-10-CM | POA: Diagnosis not present

## 2015-12-24 DIAGNOSIS — Z8546 Personal history of malignant neoplasm of prostate: Secondary | ICD-10-CM

## 2015-12-24 LAB — PSA: PSA: <0.01 ng/mL (ref 0.00–4.00)

## 2015-12-24 NOTE — Progress Notes (Signed)
Radiation Oncology Follow up Note  Name: Benjamin Mills   Date:   12/24/2015 MRN:  [id number] DOB: [birth year]    This 70 y.o. male presents to the clinic today for 4 month follow-up status post salvage radiation therapy for Gleason 7 adenocarcinoma status post prostatectomy in 2007.  REFERRING PROVIDER: Jackolyn Confer, MD  HPI: Patient is a 70 year old male now out 4 months having completed IM RT radiation therapy for Gleason 7 (3+4) adenocarcinoma the prostate status post robotic-assisted prostatectomy in 2007. Patient does have history prior to radiation of urinary incontinence with male urethral sling placed in 2014 also history of erectile dysfunction. Seen today in follow-up he is doing well. Specifically denies diarrhea dysuria or any other GI/GU complaints. He had a PSA level drawn today..  COMPLICATIONS OF TREATMENT: none  FOLLOW UP COMPLIANCE: keeps appointments   PHYSICAL EXAM:  BP 131/85   Pulse 75   Temp 97.9 F (36.6 C)   Resp 20   Wt 216 lb 2.6 oz (98.1 kg)   BMI 32.87 kg/m  On rectal exam rectal sphincter tone is good prostatic fossa is clear without evidence of nodularity or mass. No other rectal abnormality is identified. Well-developed well-nourished patient in NAD. HEENT reveals PERLA, EOMI, discs not visualized.  Oral cavity is clear. No oral mucosal lesions are identified. Neck is clear without evidence of cervical or supraclavicular adenopathy. Lungs are clear to A&P. Cardiac examination is essentially unremarkable with regular rate and rhythm without murmur rub or thrill. Abdomen is benign with no organomegaly or masses noted. Motor sensory and DTR levels are equal and symmetric in the upper and lower extremities. Cranial nerves II through XII are grossly intact. Proprioception is intact. No peripheral adenopathy or edema is identified. No motor or sensory levels are noted. Crude visual fields are within normal range.  RADIOLOGY RESULTS: No current films for  review  PLAN: I have run a PSA level on him today and will report that separately. Otherwise I'm please was overall progress. Will see him back in 6 months for follow-up and draw PSA level at that time. He continues on Lupron suppression therapy for at least another year. Patient is to call sooner with any concerns.  I would like to take this opportunity to thank you for allowing me to participate in the care of your patient.Armstead Peaks., MD

## 2015-12-26 DIAGNOSIS — I8311 Varicose veins of right lower extremity with inflammation: Secondary | ICD-10-CM | POA: Diagnosis not present

## 2015-12-26 DIAGNOSIS — L720 Epidermal cyst: Secondary | ICD-10-CM | POA: Diagnosis not present

## 2015-12-26 DIAGNOSIS — D225 Melanocytic nevi of trunk: Secondary | ICD-10-CM | POA: Diagnosis not present

## 2015-12-26 DIAGNOSIS — L821 Other seborrheic keratosis: Secondary | ICD-10-CM | POA: Diagnosis not present

## 2016-01-02 ENCOUNTER — Other Ambulatory Visit: Payer: Self-pay | Admitting: *Deleted

## 2016-01-18 ENCOUNTER — Encounter: Payer: Self-pay | Admitting: Family Medicine

## 2016-01-19 ENCOUNTER — Ambulatory Visit (INDEPENDENT_AMBULATORY_CARE_PROVIDER_SITE_OTHER): Payer: PPO

## 2016-01-19 DIAGNOSIS — Z23 Encounter for immunization: Secondary | ICD-10-CM

## 2016-01-19 NOTE — Progress Notes (Signed)
Patient received flu shot 

## 2016-01-30 ENCOUNTER — Encounter: Payer: Self-pay | Admitting: Urology

## 2016-01-30 ENCOUNTER — Ambulatory Visit (INDEPENDENT_AMBULATORY_CARE_PROVIDER_SITE_OTHER): Payer: PPO | Admitting: Urology

## 2016-01-30 ENCOUNTER — Other Ambulatory Visit: Payer: Self-pay | Admitting: *Deleted

## 2016-01-30 VITALS — BP 111/81 | HR 93 | Ht 68.0 in | Wt 216.0 lb

## 2016-01-30 DIAGNOSIS — N393 Stress incontinence (female) (male): Secondary | ICD-10-CM | POA: Diagnosis not present

## 2016-01-30 DIAGNOSIS — N5231 Erectile dysfunction following radical prostatectomy: Secondary | ICD-10-CM | POA: Diagnosis not present

## 2016-01-30 DIAGNOSIS — C61 Malignant neoplasm of prostate: Secondary | ICD-10-CM

## 2016-01-30 NOTE — Patient Instructions (Signed)
Discussed importance of bone health on ADT, recommend 1000-1200 mg daily calcium suppliment and 800-1000 IU vit D daily.  Also encouraged weight being exercises and cardiovascular health.  

## 2016-01-30 NOTE — Progress Notes (Signed)
9:10 AM  01/30/16   Benjamin Mills 11-25-1945 [id number]  Referring provider: Jackolyn Confer, MD No address on file  Chief Complaint  Patient presents with  . Prostate Cancer    30month    HPI: 70 year old male with a history of prostate cancer status post robotic prostatectomy in 2007 and microscopic hematuria.   History of prostate cancer/ rising PSA He was diagnosed and underwent laparoscopic radical prostatectomy at [facility] in [place] back in 2007.  Pathology showed Gleason 3+4, pT2cNxMx.  He developed evidence of biochemical recurrence with negative staging in 05/2015 with a rise in his PSA to 0.2. He underwent salvage radiation with Dr. Baruch Gouty in 07/2015 and started on Lupron x 1 year.  Today, he endorses hot flashes, gynecomastia, and lack of libido from his Lupron. He is able to tolerate these. He is trying to exercise. He does take a daily multivitamin.   PSA on 04/24/2012 was 0.060 at [facility] PSA drawn by his PCP was 0.11 performed on 07/03/14  0.1 on 11/2014 0.2 on 05/28/15 <0.01 on 12/24/15  ED severe erectile dysfunction following prostatectomy Tried Viagra once a few years ago and developed a headache and is scared to try anything since May be interested in trying intracavernosal injections in the future, currently having libido issues.  SUI He also has a history of stress urinary incontinence and underwent placement of a male urethral sling in 2014. Prior to placement of the sling, he leaked 3-4 pads a day but now is down to minimal. He is quite pleased with the result.  Temporary worsening of urinary symptoms during radiation but have stabilized. He was started on Flomax for unclear reasons as he has no prostate.  History of hematuria Cysto negative for any bladder pathology on 09/10/14.  CT Urogram 10/2014 negative.  NED.  PMH: Past Medical History:  Diagnosis Date  . Cancer [facility]) 2006   Prostate cancer with resection.  .  Depression   . Erectile dysfunction   . Gastric ulcer   . GERD (gastroesophageal reflux disease)   . History of splenectomy   . Hyperlipidemia   . Urinary stress incontinence, male     Surgical History: Past Surgical History:  Procedure Laterality Date  . INCONTINENCE SURGERY    . KNEE ARTHROSCOPY N/A 1969   meniscal tear  . PROSTATECTOMY  2007   removal of prostate  . SPLENECTOMY  1953   after trauma, fell out tree  . VENTRAL HERNIA REPAIR      Home Medications:    Medication List       Accurate as of 01/30/16  9:10 AM. Always use your most recent med list.          betamethasone dipropionate 0.05 % cream Commonly known as:  DIPROLENE Apply topically as needed. Reported on 06/03/2015   DULoxetine 60 MG capsule Commonly known as:  CYMBALTA Take 1 capsule (60 mg total) by mouth daily.   leuprolide 22.5 MG injection Commonly known as:  LUPRON Inject 22.5 mg into the muscle every 3 (three) months.   multivitamin capsule Take 1 capsule by mouth daily.   sildenafil 20 MG tablet Commonly known as:  REVATIO Take 20 mg by mouth 3 (three) times daily as needed. Reported on 06/03/2015   valACYclovir 1000 MG tablet Commonly known as:  VALTREX Take 1 tablet (1,000 mg total) by mouth 2 (two) times daily.       Allergies: No Known Allergies  Family History: Family History  Problem Relation Age of Onset  . Dementia Mother   . Alcohol abuse Father   . Glaucoma Sister   . Heart disease Brother     heart valve issue  . Multiple sclerosis Brother   . Kidney disease Neg Hx   . Prostate cancer Neg Hx     Social History:  reports that he quit smoking about 37 years ago. He has never used smokeless tobacco. He reports that he drinks alcohol. He reports that he does not use drugs.  ROS: Urological Symptom Review  Patient is experiencing the following symptoms: Leakage of urine Erection problems (male only)   Review of Systems UROLOGY Frequent Urination?:  No Hard to postpone urination?: No Burning/pain with urination?: No Get up at night to urinate?: No Leakage of urine?: No Urine stream starts and stops?: No Trouble starting stream?: No Do you have to strain to urinate?: No Blood in urine?: No Urinary tract infection?: No Sexually transmitted disease?: No Injury to kidneys or bladder?: No Painful intercourse?: No Weak stream?: No Erection problems?: No Penile pain?: No Gastrointestinal Nausea?: No Vomiting?: No Indigestion/heartburn?: No Diarrhea?: No Constipation?: No Constitutional Fever: No Night sweats?: No Weight loss?: No Fatigue?: No Skin Skin rash/lesions?: No Itching?: No Eyes Blurred vision?: No Double vision?: No Ears/Nose/Throat Sore throat?: No Sinus problems?: No Hematologic/Lymphatic Swollen glands?: No Easy bruising?: No Cardiovascular Leg swelling?: No Chest pain?: No Respiratory Cough?: No Shortness of breath?: No Endocrine Excessive thirst?: No Musculoskeletal Back pain?: No Joint pain?: No Neurological Headaches?: No Dizziness?: No Psychologic Depression?: No Anxiety?: No   Physical Exam: BP 111/81   Pulse 93   Ht 5\' 8"  (1.727 m)   Wt 216 lb (98 kg)   BMI 32.84 kg/m   Constitutional:  Alert and oriented, No acute distress.  Presents with wife today. HEENT: [place] AT, moist mucus membranes.  Trachea midline, no masses. Cardiovascular: No clubbing, cyanosis, or edema. Endo: Mild gynecomastia appreciated Respiratory: Normal respiratory effort, no increased work of breathing. GI: Abdomen is soft, nontender, nondistended, no abdominal masses Skin: No rashes, bruises or suspicious lesions. Neurologic: Grossly intact, no focal deficits, moving all 4 extremities. Psychiatric: Normal mood and affect.  Laboratory Data: Lab Results  Component Value Date   WBC 6.1 08/18/2015   HGB 14.0 08/18/2015   HCT 40.8 08/18/2015   MCV 89.9 08/18/2015   PLT 229 08/18/2015    Lab Results   Component Value Date   CREATININE 0.80 06/12/2015    Lab Results  Component Value Date   PSA <0.01 12/24/2015   PSA 0.11 07/03/2014   Urinalysis Results for orders placed or performed in visit on 12/24/15  PSA  Result Value Ref Range   PSA <0.01 0.00 - 4.00 ng/mL    Pertinent Imaging: n/a  Assessment & Plan:    1. History of prostate cancer Gleason 3+4, pT2cNxMx s/p laparoscopic radical prostatectomy at [facility] in 2007 With evidence of biochemical recurrence, PSA rise to 0.2 with a doubling time of one year status post salvage radiation completed in 08/2015 along with one year of adjuvant ADT.  Patient is to follow her Lupron next week. He will check with the cancer center and see if he scheduled to get up there. Otherwise we can do it here.  Discussed importance of bone health on ADT, recommend 1000-1200 mg daily calcium suppliment and [phone] IU vit D daily.  Also encouraged weight being exercises and cardiovascular health.  He is to follow up with Dr. Baruch Gouty early  next year and will follow-up with [place] thereafter.  2. SUI (stress urinary incontinence), male  Mild stress urinary incontinence status post sling placed into 2014. Minimal bother. No indication for Flomax, and advised to stop this medication  3. Erectile dysfunction following radical prostatectomy  Stable severe erectile dysfunction.  We did discuss intracavernosal injections today including the procedure for injection teaching, test dosing He would like to complete his Lupron and then may consider coming into the office for injection teaching I have advised him when he is ready to pursue this, give our office a call and we will arrange for the medication to be called as a compounding pharmacy with return for injection teaching   Return in about 1 year (around 01/29/2017) for recheck prostate cancer, PSA.  Hollice Espy, MD  [facility] [address], [place], [place] [address] [phone]

## 2016-02-04 DIAGNOSIS — F322 Major depressive disorder, single episode, severe without psychotic features: Secondary | ICD-10-CM | POA: Diagnosis not present

## 2016-02-05 ENCOUNTER — Other Ambulatory Visit: Payer: Self-pay | Admitting: *Deleted

## 2016-02-05 ENCOUNTER — Ambulatory Visit: Payer: PPO

## 2016-02-05 DIAGNOSIS — C61 Malignant neoplasm of prostate: Secondary | ICD-10-CM

## 2016-02-10 DIAGNOSIS — F4320 Adjustment disorder, unspecified: Secondary | ICD-10-CM | POA: Diagnosis not present

## 2016-02-22 ENCOUNTER — Encounter: Payer: Self-pay | Admitting: Family Medicine

## 2016-02-24 ENCOUNTER — Encounter: Payer: Self-pay | Admitting: Family

## 2016-02-24 ENCOUNTER — Ambulatory Visit (INDEPENDENT_AMBULATORY_CARE_PROVIDER_SITE_OTHER): Payer: PPO | Admitting: Family

## 2016-02-24 VITALS — BP 126/82 | HR 90 | Temp 97.6°F | Ht 68.0 in | Wt 217.6 lb

## 2016-02-24 DIAGNOSIS — F4320 Adjustment disorder, unspecified: Secondary | ICD-10-CM | POA: Diagnosis not present

## 2016-02-24 DIAGNOSIS — J209 Acute bronchitis, unspecified: Secondary | ICD-10-CM

## 2016-02-24 MED ORDER — GUAIFENESIN-CODEINE 100-10 MG/5ML PO SYRP: 5.0000 mL | ORAL_SOLUTION | Freq: Two times a day (BID) | ORAL | 0 refills | Status: DC | PRN

## 2016-02-24 MED ORDER — ALBUTEROL SULFATE (2.5 MG/3ML) 0.083% IN NEBU
2.5000 mg | INHALATION_SOLUTION | Freq: Once | RESPIRATORY_TRACT | Status: AC
  Administered 2016-02-24: 2.5 mg via RESPIRATORY_TRACT

## 2016-02-24 MED ORDER — AZITHROMYCIN 250 MG PO TABS: ORAL_TABLET | ORAL | 0 refills | Status: DC

## 2016-02-24 NOTE — Progress Notes (Signed)
Pre visit review using our clinic review tool, if applicable. No additional management support is needed unless otherwise documented below in the visit note. 

## 2016-02-24 NOTE — Progress Notes (Signed)
Subjective:    Patient ID: Benjamin Mills, male    DOB: 1945/12/20, 70 y.o.   MRN: [id number]  CC: Benjamin Mills is a 70 y.o. male who presents today for an acute visit.    HPI: Patient here for acute visit with chief complaint of productive cough, congestion x 2.5 weeks. Tried Mucinex DM without resolve. Endorses wheezing, nasal congestion, ear pressure. No  SOB, HA. Denies exertional chest pain or pressure, numbness or tingling radiating to left arm or jaw, palpitations, dizziness, frequent headaches, changes in vision, or shortness of breath.   He has a history of smoking.    HISTORY:  Past Medical History:  Diagnosis Date  . Cancer [facility]) 2006   Prostate cancer with resection.  . Depression   . Erectile dysfunction   . Gastric ulcer   . GERD (gastroesophageal reflux disease)   . History of splenectomy   . Hyperlipidemia   . Urinary stress incontinence, male    Past Surgical History:  Procedure Laterality Date  . INCONTINENCE SURGERY    . KNEE ARTHROSCOPY N/A 1969   meniscal tear  . PROSTATECTOMY  2007   removal of prostate  . SPLENECTOMY  1953   after trauma, fell out tree  . VENTRAL HERNIA REPAIR     Family History  Problem Relation Age of Onset  . Dementia Mother   . Alcohol abuse Father   . Glaucoma Sister   . Heart disease Brother     heart valve issue  . Multiple sclerosis Brother   . Kidney disease Neg Hx   . Prostate cancer Neg Hx     Allergies: Patient has no known allergies. Current Outpatient Prescriptions on File Prior to Visit  Medication Sig Dispense Refill  . betamethasone dipropionate (DIPROLENE) 0.05 % cream Apply topically as needed. Reported on 06/03/2015    . DULoxetine (CYMBALTA) 60 MG capsule Take 1 capsule (60 mg total) by mouth daily. 90 capsule 3  . leuprolide (LUPRON) 22.5 MG injection Inject 22.5 mg into the muscle every 3 (three) months.    . Multiple Vitamin (MULTIVITAMIN) capsule Take 1 capsule by mouth daily.    .  sildenafil (REVATIO) 20 MG tablet Take 20 mg by mouth 3 (three) times daily as needed. Reported on 06/03/2015    . valACYclovir (VALTREX) 1000 MG tablet Take 1 tablet (1,000 mg total) by mouth 2 (two) times daily. 14 tablet 3   No current facility-administered medications on file prior to visit.     Social History  Substance Use Topics  . Smoking status: Former Smoker    Quit date: 06/05/1978  . Smokeless tobacco: Never Used  . Alcohol use 0.0 oz/week     Comment: OCC    Review of Systems  Constitutional: Negative for chills and fever.  HENT: Positive for congestion and ear pain (pressure). Negative for sinus pain, sinus pressure and sore throat.   Respiratory: Positive for cough.   Cardiovascular: Negative for chest pain and palpitations.  Gastrointestinal: Negative for nausea and vomiting.      Objective:    BP 126/82   Pulse 90   Temp 97.6 F (36.4 C) (Oral)   Ht 5\' 8"  (1.727 m)   Wt 217 lb 9.6 oz (98.7 kg)   SpO2 97%   BMI 33.09 kg/m    Physical Exam  Constitutional: Vital signs are normal. He appears well-developed and well-nourished.  HENT:  Head: Normocephalic and atraumatic.  Right Ear: Hearing, tympanic membrane, external ear and  ear canal normal. No drainage, swelling or tenderness. Tympanic membrane is not injected, not erythematous and not bulging. No middle ear effusion. No decreased hearing is noted.  Left Ear: Hearing, tympanic membrane, external ear and ear canal normal. No drainage, swelling or tenderness. Tympanic membrane is not injected, not erythematous and not bulging.  No middle ear effusion. No decreased hearing is noted.  Nose: Nose normal. Right sinus exhibits no maxillary sinus tenderness and no frontal sinus tenderness. Left sinus exhibits no maxillary sinus tenderness and no frontal sinus tenderness.  Mouth/Throat: Uvula is midline, oropharynx is clear and moist and mucous membranes are normal. No oropharyngeal exudate, posterior oropharyngeal  edema, posterior oropharyngeal erythema or tonsillar abscesses.  Eyes: Conjunctivae are normal.  Cardiovascular: Regular rhythm and normal heart sounds.   Pulmonary/Chest: Effort normal. No respiratory distress. He has decreased breath sounds in the right lower field and the left lower field. He has no wheezes. He has no rhonchi. He has no rales.  Lymphadenopathy:       Head (right side): No submental, no submandibular, no tonsillar, no preauricular, no posterior auricular and no occipital adenopathy present.       Head (left side): No submental, no submandibular, no tonsillar, no preauricular, no posterior auricular and no occipital adenopathy present.    He has no cervical adenopathy.  Neurological: He is alert.  Skin: Skin is warm and dry.  Psychiatric: He has a normal mood and affect. His speech is normal and behavior is normal.  Vitals reviewed.  Patient felt significantly better after albuterol treatment. Lung sounds clear and increased     Assessment & Plan:   1. Acute bronchitis, unspecified organism Afebrile. No adventitious lung sounds. SA02 97.  Improved with nebulizer treatment. Based on duration of symptoms patient and I agreed on azithromycin. Cough syrup when necessary.   - albuterol (PROVENTIL) (2.5 MG/3ML) 0.083% nebulizer solution 2.5 mg; Take 3 mLs (2.5 mg total) by nebulization once. - azithromycin (ZITHROMAX) 250 MG tablet; Tale 500 mg PO on day 1, then 250 mg PO q24h x 4 days.  Dispense: 6 tablet; Refill: 0 - guaiFENesin-codeine (CHERATUSSIN AC) 100-10 MG/5ML syrup; Take 5 mLs by mouth every 12 (twelve) hours as needed for cough (take as needed for cough.).  Dispense: 120 mL; Refill: 0    I am having Mr. Toelle maintain his betamethasone dipropionate, sildenafil, multivitamin, valACYclovir, DULoxetine, and leuprolide.   No orders of the defined types were placed in this encounter.   Return precautions given.   Risks, benefits, and alternatives of the  medications and treatment plan prescribed today were discussed, and patient expressed understanding.   Education regarding symptom management and diagnosis given to patient on AVS.  Continue to follow with Coral Spikes, DO for routine health maintenance.   [name] and I agreed with plan.   Mable Paris, FNP

## 2016-02-24 NOTE — Patient Instructions (Signed)
Please take cough medication at night only as needed. As we discussed, I do not recommend dosing throughout the day as coughing is a protective mechanism . It also helps to break up thick mucous.  Do not take cough suppressants with alcohol as can lead to trouble breathing. Advise caution if taking cough suppressant and operating machinery ( i.e driving a car) as you may feel very tired.    Increase intake of clear fluids. Congestion is best treated by hydration, when mucus is wetter, it is thinner, less sticky, and easier to expel from the body, either through coughing up drainage, or by blowing your nose.   Get plenty of rest.   Use saline nasal drops and blow your nose frequently. Run a humidifier at night and elevate the head of the bed. Vicks Vapor rub will help with congestion and cough. Steam showers and sinus massage for congestion.   Use Acetaminophen or Ibuprofen as needed for fever or pain. Avoid second hand smoke. Even the smallest exposure will worsen symptoms.   Over the counter medications you can try include Delsym for cough, a decongestant for congestion, and Mucinex or Robitussin as an expectorant. Be sure to just get the plain Mucinex or Robitussin that just has one medication (Guaifenesen). We don't recommend the combination products. Note, be sure to drink two glasses of water with each dose of Mucinex as the medication will not work well without adequate hydration.   You can also try a teaspoon of honey to see if this will help reduce cough. Throat lozenges can sometimes be beneficial as well.    This illness will typically last 7 - 10 days.   Please follow up with our clinic if you develop a fever greater than 101 F, symptoms worsen, or do not resolve in the next week.    

## 2016-03-08 DIAGNOSIS — F4320 Adjustment disorder, unspecified: Secondary | ICD-10-CM | POA: Diagnosis not present

## 2016-03-10 DIAGNOSIS — F322 Major depressive disorder, single episode, severe without psychotic features: Secondary | ICD-10-CM | POA: Diagnosis not present

## 2016-03-11 ENCOUNTER — Inpatient Hospital Stay: Payer: PPO | Attending: Radiation Oncology

## 2016-03-11 DIAGNOSIS — Z79818 Long term (current) use of other agents affecting estrogen receptors and estrogen levels: Secondary | ICD-10-CM | POA: Insufficient documentation

## 2016-03-11 DIAGNOSIS — C61 Malignant neoplasm of prostate: Secondary | ICD-10-CM

## 2016-03-11 MED ORDER — LEUPROLIDE ACETATE (4 MONTH) 30 MG IM KIT
30.0000 mg | PACK | Freq: Once | INTRAMUSCULAR | Status: AC
  Administered 2016-03-11: 30 mg via INTRAMUSCULAR
  Filled 2016-03-11: qty 30

## 2016-03-30 DIAGNOSIS — F4320 Adjustment disorder, unspecified: Secondary | ICD-10-CM | POA: Diagnosis not present

## 2016-04-02 DIAGNOSIS — F4320 Adjustment disorder, unspecified: Secondary | ICD-10-CM | POA: Diagnosis not present

## 2016-04-08 ENCOUNTER — Other Ambulatory Visit: Payer: Self-pay | Admitting: Family Medicine

## 2016-04-08 ENCOUNTER — Ambulatory Visit (INDEPENDENT_AMBULATORY_CARE_PROVIDER_SITE_OTHER): Payer: PPO

## 2016-04-08 VITALS — BP 110/70 | HR 63 | Temp 97.7°F | Resp 14 | Ht 67.0 in | Wt 218.0 lb

## 2016-04-08 DIAGNOSIS — Z0000 Encounter for general adult medical examination without abnormal findings: Secondary | ICD-10-CM

## 2016-04-08 DIAGNOSIS — Z23 Encounter for immunization: Secondary | ICD-10-CM

## 2016-04-08 NOTE — Progress Notes (Signed)
Care was provided under my supervision. I agree with the management as indicated in the note.  Maryama Kuriakose DO  

## 2016-04-08 NOTE — Progress Notes (Signed)
Subjective:   Benjamin Mills is a 70 y.o. male who presents for Medicare Annual/Subsequent preventive examination.  Review of Systems:  No ROS.  Medicare Wellness Visit.  Cardiac Risk Factors include: advanced age (>75men, >80 women);male gender;obesity (BMI >30kg/m2)     Objective:    Vitals: BP 110/70 (BP Location: Right Arm, Patient Position: Sitting, Cuff Size: Normal)   Pulse 63   Temp 97.7 F (36.5 C) (Oral)   Resp 14   Ht 5\' 7"  (1.702 m)   Wt 218 lb (98.9 kg)   SpO2 97%   BMI 34.14 kg/m   Body mass index is 34.14 kg/m.  Tobacco History  Smoking Status  . Former Smoker  . Quit date: 06/05/1978  Smokeless Tobacco  . Never Used     Counseling given: Not Answered   Past Medical History:  Diagnosis Date  . Cancer [facility]) 2006   Prostate cancer with resection.  . Depression   . Erectile dysfunction   . Gastric ulcer   . GERD (gastroesophageal reflux disease)   . History of splenectomy   . Hyperlipidemia   . Urinary stress incontinence, male    Past Surgical History:  Procedure Laterality Date  . INCONTINENCE SURGERY    . KNEE ARTHROSCOPY N/A 1969   meniscal tear  . PROSTATECTOMY  2007   removal of prostate  . SPLENECTOMY  1953   after trauma, fell out tree  . VENTRAL HERNIA REPAIR     Family History  Problem Relation Age of Onset  . Dementia Mother   . Alcohol abuse Father   . Glaucoma Sister   . Heart disease Brother     heart valve issue  . Multiple sclerosis Brother   . Kidney disease Neg Hx   . Prostate cancer Neg Hx    History  Sexual Activity  . Sexual activity: Not Currently    Outpatient Encounter Prescriptions as of 04/08/2016  Medication Sig  . betamethasone dipropionate (DIPROLENE) 0.05 % cream Apply topically as needed. Reported on 06/03/2015  . DULoxetine (CYMBALTA) 60 MG capsule Take 1 capsule (60 mg total) by mouth daily.  Marland Kitchen leuprolide (LUPRON) 22.5 MG injection Inject 22.5 mg into the muscle every 3 (three) months.  .  Multiple Vitamin (MULTIVITAMIN) capsule Take 1 capsule by mouth daily.  . sildenafil (REVATIO) 20 MG tablet Take 20 mg by mouth 3 (three) times daily as needed. Reported on 06/03/2015  . valACYclovir (VALTREX) 1000 MG tablet Take 1 tablet (1,000 mg total) by mouth 2 (two) times daily.  . [DISCONTINUED] azithromycin (ZITHROMAX) 250 MG tablet Tale 500 mg PO on day 1, then 250 mg PO q24h x 4 days.  . [DISCONTINUED] guaiFENesin-codeine (CHERATUSSIN AC) 100-10 MG/5ML syrup Take 5 mLs by mouth every 12 (twelve) hours as needed for cough (take as needed for cough.). (Patient not taking: Reported on 04/08/2016)   No facility-administered encounter medications on file as of 04/08/2016.     Activities of Daily Living In your present state of health, do you have any difficulty performing the following activities: 04/08/2016 04/09/2015  Hearing? N N  Vision? N N  Difficulty concentrating or making decisions? N N  Walking or climbing stairs? Y N  Dressing or bathing? N N  Doing errands, shopping? N N  Preparing Food and eating ? N N  Using the Toilet? N N  In the past six months, have you accidently leaked urine? N N  Do you have problems with loss of bowel control? N  N  Managing your Medications? N N  Managing your Finances? N N  Housekeeping or managing your Housekeeping? N N  Some recent data might be hidden    Patient Care Team: Coral Spikes, DO as PCP - General (Family Medicine)   Assessment:    This is a routine wellness examination for Veto. The goal of the wellness visit is to assist the patient how to close the gaps in care and create a preventative care plan for the patient.   Osteoporosis reviewed.  Medications reviewed; taking without issues or barriers.  Safety issues reviewed; lives with wife.  Smoke detectors in the home. No firearms in the home. Wears seatbelts when driving or riding with others. No violence in the home.  No identified risk were noted; The patient was  oriented x 3; appropriate in dress and manner and no objective failures at ADL's or IADL's.   BMI; discussed the importance of a healthy diet, water intake and exercise. Educational material provided.  TDAP and ZOSTAVAX vaccine deferred per patient request to follow up with insurance.    Prevnar 13 vaccine administered R deltoid, tolerated well.  Educational material provided.  Patient Concerns: None at this time. Follow up with PCP as needed.  Exercise Activities and Dietary recommendations Current Exercise Habits: Home exercise routine, Type of exercise: walking;stretching, Time (Minutes): 20, Frequency (Times/Week): 4, Weekly Exercise (Minutes/Week): 80, Intensity: Mild  Goals    . Healthy Lifestyle          Stay active and walk for exercise as tolerated.   Stay hydrated and drink plenty of water/fluids. Low carb foods.  Lean meats, vegetables.      Fall Risk Fall Risk  04/08/2016 02/24/2016 12/24/2015 09/25/2015 06/16/2015  Falls in the past year? No No No No No   Depression Screen PHQ 2/9 Scores 04/08/2016 02/24/2016 12/24/2015 09/25/2015  PHQ - 2 Score 0 0 0 0    Cognitive Function MMSE - Mini Mental State Exam 04/08/2016 04/09/2015  Orientation to time 5 5  Orientation to Place 5 5  Registration 3 3  Attention/ Calculation 5 5  Recall 3 3  Language- name 2 objects 2 2  Language- repeat 1 1  Language- follow 3 step command 3 3  Language- read & follow direction 1 1  Write a sentence 1 1  Copy design 1 1  Total score 30 30        Immunization History  Administered Date(s) Administered  . Influenza, High Dose Seasonal PF 01/19/2016  . Influenza,inj,Quad PF,36+ Mos 01/30/2015  . Influenza-Unspecified 02/02/2014  . Pneumococcal Conjugate-13 04/08/2016  . Pneumococcal Polysaccharide-23 06/05/2012   Screening Tests Health Maintenance  Topic Date Due  . TETANUS/TDAP  04/25/1964  . ZOSTAVAX  04/25/2005  . COLONOSCOPY  08/29/2021  . INFLUENZA VACCINE   Completed  . Hepatitis C Screening  Completed  . PNA vac Low Risk Adult  Completed      Plan:    End of life planning; Advance aging; Advanced directives discussed. Copy of current HCPOA/Living Will requested.  Medicare Attestation I have personally reviewed: The patient's medical and social history Their use of alcohol, tobacco or illicit drugs Their current medications and supplements The patient's functional ability including ADLs,fall risks, home safety risks, cognitive, and hearing and visual impairment Diet and physical activities Evidence for depression   The patient's weight, height, BMI, and visual acuity have been recorded in the chart.  I have made referrals and provided education to the patient based  on review of the above and I have provided the patient with a written personalized care plan for preventive services.    During the course of the visit the patient was educated and counseled about the following appropriate screening and preventive services:   Vaccines to include Pneumoccal, Influenza, Hepatitis B, Td, Zostavax, HCV  Electrocardiogram  Cardiovascular Disease  Colorectal cancer screening  Diabetes screening  Prostate Cancer Screening  Glaucoma screening  Nutrition counseling   Smoking cessation counseling  Patient Instructions (the written plan) was given to the patient.    Varney Biles, LPN  [id number]

## 2016-04-08 NOTE — Patient Instructions (Addendum)
Benjamin Mills , Thank you for taking time to come for your Medicare Wellness Visit. I appreciate your ongoing commitment to your health goals. Please review the following plan we discussed and let me know if I can assist you in the future.   Follow up with Dr. Lacinda Axon as needed.  Happy Holidays!!!  These are the goals we discussed: Goals    . Healthy Lifestyle          Stay active and walk for exercise as tolerated.   Stay hydrated and drink plenty of water/fluids. Low carb foods.  Lean meats, vegetables.       This is a list of the screening recommended for you and due dates:  Health Maintenance  Topic Date Due  . Tetanus Vaccine  04/25/1964  . Shingles Vaccine  04/25/2005  . Colon Cancer Screening  08/29/2021  . Flu Shot  Completed  .  Hepatitis C: One time screening is recommended by Center for Disease Control  (CDC) for  adults born from [birth year] through 1965.   Completed  . Pneumonia vaccines  Completed      Fall Prevention in the Home Introduction Falls can cause injuries. They can happen to people of all ages. There are many things you can do to make your home safe and to help prevent falls. What can I do on the outside of my home?  Regularly fix the edges of walkways and driveways and fix any cracks.  Remove anything that might make you trip as you walk through a door, such as a raised step or threshold.  Trim any bushes or trees on the path to your home.  Use bright outdoor lighting.  Clear any walking paths of anything that might make someone trip, such as rocks or tools.  Regularly check to see if handrails are loose or broken. Make sure that both sides of any steps have handrails.  Any raised decks and porches should have guardrails on the edges.  Have any leaves, snow, or ice cleared regularly.  Use sand or salt on walking paths during winter.  Clean up any spills in your garage right away. This includes oil or grease spills. What can I do in the  bathroom?  Use night lights.  Install grab bars by the toilet and in the tub and shower. Do not use towel bars as grab bars.  Use non-skid mats or decals in the tub or shower.  If you need to sit down in the shower, use a plastic, non-slip stool.  Keep the floor dry. Clean up any water that spills on the floor as soon as it happens.  Remove soap buildup in the tub or shower regularly.  Attach bath mats securely with double-sided non-slip rug tape.  Do not have throw rugs and other things on the floor that can make you trip. What can I do in the bedroom?  Use night lights.  Make sure that you have a light by your bed that is easy to reach.  Do not use any sheets or blankets that are too big for your bed. They should not hang down onto the floor.  Have a firm chair that has side arms. You can use this for support while you get dressed.  Do not have throw rugs and other things on the floor that can make you trip. What can I do in the kitchen?  Clean up any spills right away.  Avoid walking on wet floors.  Keep items that you use  a lot in easy-to-reach places.  If you need to reach something above you, use a strong step stool that has a grab bar.  Keep electrical cords out of the way.  Do not use floor polish or wax that makes floors slippery. If you must use wax, use non-skid floor wax.  Do not have throw rugs and other things on the floor that can make you trip. What can I do with my stairs?  Do not leave any items on the stairs.  Make sure that there are handrails on both sides of the stairs and use them. Fix handrails that are broken or loose. Make sure that handrails are as long as the stairways.  Check any carpeting to make sure that it is firmly attached to the stairs. Fix any carpet that is loose or worn.  Avoid having throw rugs at the top or bottom of the stairs. If you do have throw rugs, attach them to the floor with carpet tape.  Make sure that you have a  light switch at the top of the stairs and the bottom of the stairs. If you do not have them, ask someone to add them for you. What else can I do to help prevent falls?  Wear shoes that:  Do not have high heels.  Have rubber bottoms.  Are comfortable and fit you well.  Are closed at the toe. Do not wear sandals.  If you use a stepladder:  Make sure that it is fully opened. Do not climb a closed stepladder.  Make sure that both sides of the stepladder are locked into place.  Ask someone to hold it for you, if possible.  Clearly mark and make sure that you can see:  Any grab bars or handrails.  First and last steps.  Where the edge of each step is.  Use tools that help you move around (mobility aids) if they are needed. These include:  Canes.  Walkers.  Scooters.  Crutches.  Turn on the lights when you go into a dark area. Replace any light bulbs as soon as they burn out.  Set up your furniture so you have a clear path. Avoid moving your furniture around.  If any of your floors are uneven, fix them.  If there are any pets around you, be aware of where they are.  Review your medicines with your doctor. Some medicines can make you feel dizzy. This can increase your chance of falling. Ask your doctor what other things that you can do to help prevent falls. This information is not intended to replace advice given to you by your health care provider. Make sure you discuss any questions you have with your health care provider. Document Released: 01/23/2009 Document Revised: 09/04/2015 Document Reviewed: 05/03/2014  2017 Elsevier

## 2016-04-14 ENCOUNTER — Encounter: Payer: Self-pay | Admitting: Family Medicine

## 2016-04-15 ENCOUNTER — Ambulatory Visit (INDEPENDENT_AMBULATORY_CARE_PROVIDER_SITE_OTHER): Payer: PPO | Admitting: Family Medicine

## 2016-04-15 ENCOUNTER — Encounter: Payer: Self-pay | Admitting: Family Medicine

## 2016-04-15 DIAGNOSIS — R05 Cough: Secondary | ICD-10-CM | POA: Diagnosis not present

## 2016-04-15 DIAGNOSIS — R059 Cough, unspecified: Secondary | ICD-10-CM

## 2016-04-15 MED ORDER — GUAIFENESIN-CODEINE 100-10 MG/5ML PO SYRP: 5.0000 mL | ORAL_SOLUTION | Freq: Two times a day (BID) | ORAL | 0 refills | Status: DC | PRN

## 2016-04-15 MED ORDER — PREDNISONE 50 MG PO TABS: ORAL_TABLET | ORAL | 0 refills | Status: DC

## 2016-04-15 NOTE — Assessment & Plan Note (Addendum)
New acute illness. Likely viral. Exam unremarkable. No indication for antibiotics. Treating with prednisone and Cheratussin.

## 2016-04-15 NOTE — Progress Notes (Signed)
Pre visit review using our clinic review tool, if applicable. No additional management support is needed unless otherwise documented below in the visit note. 

## 2016-04-15 NOTE — Patient Instructions (Signed)
This is likely viral.  Your exam was normal.  No antibiotics at this time.  Prednisone will help with the cough and wheezing.  Take care  Dr. Lacinda Axon

## 2016-04-15 NOTE — Progress Notes (Signed)
Subjective:  Patient ID: Benjamin Mills, male    DOB: 23-Mar-1946  Age: 72 y.o. MRN: [id number]  CC: Wheezing, cough, congestion  HPI:  71 year old male who is a former smoker presents with the above complaints.  Patient was treated for acute bronchitis earlier in November. He states that over the past few days he's had a recurrence of his symptoms. He's been experiencing wheezing, cough, congestion. Mild in severity. No fever. No associated shortness of breath. No known exacerbating or relieving factors. No other associative symptoms. No other complaints or concerns at this time.  Social Hx   Social History   Social History  . Marital status: Married    Spouse name: N/A  . Number of children: N/A  . Years of education: N/A   Social History Main Topics  . Smoking status: Former Smoker    Quit date: 06/05/1978  . Smokeless tobacco: Never Used  . Alcohol use 0.0 oz/week     Comment: OCC  . Drug use: No  . Sexual activity: Not Currently   Other Topics Concern  . None   Social History Narrative   Born in [place].   Lived in [place].   [place] to [place] , brother lives in [place].      Has 2 brothers and 1 sister.      Lives with wife in [place]. Has 3 daughters.      Work - retired, Surveyor, quantity      Diet - regular      Exercise - walks occasionally    Review of Systems  Constitutional: Negative for fever.  HENT: Positive for congestion.   Respiratory: Positive for cough and wheezing.    Objective:  BP 110/74   Pulse 94   Temp 97.6 F (36.4 C) (Oral)   Resp 14   Wt 216 lb 6.4 oz (98.2 kg)   SpO2 99%   BMI 33.89 kg/m   BP/Weight 04/15/2016 04/08/2016 0000000  Systolic BP [id number]  Diastolic BP 74 70 72  Wt. (Lbs) 216.4 218 -  BMI 33.89 34.14 -   Physical Exam  Constitutional: He is oriented to person, place, and time. He appears well-developed. No distress.  HENT:  Mouth/Throat: Oropharynx is clear and moist.  Cardiovascular: Normal rate and  regular rhythm.   Pulmonary/Chest: Effort normal and breath sounds normal.  Neurological: He is alert and oriented to person, place, and time.  Psychiatric: He has a normal mood and affect.  Vitals reviewed.  Lab Results  Component Value Date   WBC 6.1 08/18/2015   HGB 14.0 08/18/2015   HCT 40.8 08/18/2015   PLT 229 08/18/2015   GLUCOSE 91 06/05/2014   CHOL 181 06/05/2014   TRIG 81.0 06/05/2014   HDL 49.40 06/05/2014   LDLCALC 115 (H) 06/05/2014   ALT 14 06/05/2014   AST 18 06/05/2014   NA 137 06/05/2014   K 4.4 06/05/2014   CL 106 06/05/2014   CREATININE 0.80 06/12/2015   BUN 17 06/05/2014   CO2 27 06/05/2014   TSH 0.69 06/05/2014   PSA <0.01 12/24/2015    Assessment & Plan:   Problem List Items Addressed This Visit    Cough    New acute illness. Likely viral. Exam unremarkable. No indication for antibiotics. Treating with prednisone and Cheratussin.         Meds ordered this encounter  Medications  . predniSONE (DELTASONE) 50 MG tablet    Sig: 1 tablet daily x 5 days.  Dispense:  5 tablet    Refill:  0  . guaiFENesin-codeine (CHERATUSSIN AC) 100-10 MG/5ML syrup    Sig: Take 5 mLs by mouth every 12 (twelve) hours as needed for cough (take as needed for cough.).    Dispense:  120 mL    Refill:  0    Follow-up: PRN  [place]

## 2016-05-04 ENCOUNTER — Encounter: Payer: Self-pay | Admitting: Family Medicine

## 2016-05-04 ENCOUNTER — Other Ambulatory Visit: Payer: Self-pay | Admitting: Family Medicine

## 2016-05-04 DIAGNOSIS — R05 Cough: Secondary | ICD-10-CM

## 2016-05-04 DIAGNOSIS — R059 Cough, unspecified: Secondary | ICD-10-CM

## 2016-05-05 ENCOUNTER — Ambulatory Visit (INDEPENDENT_AMBULATORY_CARE_PROVIDER_SITE_OTHER): Payer: PPO

## 2016-05-05 DIAGNOSIS — R05 Cough: Secondary | ICD-10-CM

## 2016-05-05 DIAGNOSIS — R059 Cough, unspecified: Secondary | ICD-10-CM

## 2016-06-01 DIAGNOSIS — F4320 Adjustment disorder, unspecified: Secondary | ICD-10-CM | POA: Diagnosis not present

## 2016-06-17 ENCOUNTER — Encounter: Payer: Self-pay | Admitting: Radiation Oncology

## 2016-06-17 ENCOUNTER — Inpatient Hospital Stay: Payer: PPO | Attending: Radiation Oncology

## 2016-06-17 DIAGNOSIS — C61 Malignant neoplasm of prostate: Secondary | ICD-10-CM | POA: Insufficient documentation

## 2016-06-17 DIAGNOSIS — Z79818 Long term (current) use of other agents affecting estrogen receptors and estrogen levels: Secondary | ICD-10-CM | POA: Insufficient documentation

## 2016-06-18 ENCOUNTER — Inpatient Hospital Stay: Payer: PPO

## 2016-06-18 ENCOUNTER — Other Ambulatory Visit: Payer: Self-pay | Admitting: *Deleted

## 2016-06-18 DIAGNOSIS — C61 Malignant neoplasm of prostate: Secondary | ICD-10-CM

## 2016-06-18 DIAGNOSIS — Z79818 Long term (current) use of other agents affecting estrogen receptors and estrogen levels: Secondary | ICD-10-CM | POA: Diagnosis not present

## 2016-06-18 LAB — PSA

## 2016-06-24 ENCOUNTER — Encounter: Payer: Self-pay | Admitting: Radiation Oncology

## 2016-06-24 ENCOUNTER — Ambulatory Visit
Admission: RE | Admit: 2016-06-24 | Discharge: 2016-06-24 | Disposition: A | Discharge status: Home / Self Care | Payer: PPO | Source: Ambulatory Visit | Attending: Radiation Oncology | Admitting: Radiation Oncology

## 2016-06-24 VITALS — BP 115/76 | HR 97 | Temp 96.9°F | Resp 20 | Wt 211.3 lb

## 2016-06-24 DIAGNOSIS — Z923 Personal history of irradiation: Secondary | ICD-10-CM | POA: Diagnosis not present

## 2016-06-24 DIAGNOSIS — C61 Malignant neoplasm of prostate: Secondary | ICD-10-CM | POA: Insufficient documentation

## 2016-06-24 DIAGNOSIS — R32 Unspecified urinary incontinence: Secondary | ICD-10-CM | POA: Diagnosis not present

## 2016-06-24 DIAGNOSIS — Z9079 Acquired absence of other genital organ(s): Secondary | ICD-10-CM | POA: Insufficient documentation

## 2016-06-25 DIAGNOSIS — F4320 Adjustment disorder, unspecified: Secondary | ICD-10-CM | POA: Diagnosis not present

## 2016-06-28 DIAGNOSIS — F4320 Adjustment disorder, unspecified: Secondary | ICD-10-CM | POA: Diagnosis not present

## 2016-07-01 ENCOUNTER — Other Ambulatory Visit: Payer: Self-pay | Admitting: Cardiovascular Disease

## 2016-07-01 DIAGNOSIS — I6523 Occlusion and stenosis of bilateral carotid arteries: Secondary | ICD-10-CM

## 2016-07-01 DIAGNOSIS — F4320 Adjustment disorder, unspecified: Secondary | ICD-10-CM | POA: Diagnosis not present

## 2016-07-02 ENCOUNTER — Other Ambulatory Visit: Payer: Self-pay | Admitting: *Deleted

## 2016-07-02 ENCOUNTER — Inpatient Hospital Stay: Payer: PPO

## 2016-07-02 DIAGNOSIS — C61 Malignant neoplasm of prostate: Secondary | ICD-10-CM

## 2016-07-02 MED ORDER — LEUPROLIDE ACETATE (4 MONTH) 30 MG IM KIT
30.0000 mg | PACK | Freq: Once | INTRAMUSCULAR | Status: AC
  Administered 2016-07-02: 30 mg via INTRAMUSCULAR
  Filled 2016-07-02: qty 30

## 2016-07-26 ENCOUNTER — Ambulatory Visit: Payer: PPO

## 2016-07-26 DIAGNOSIS — I6523 Occlusion and stenosis of bilateral carotid arteries: Secondary | ICD-10-CM

## 2016-07-27 LAB — VAS US CAROTID
LCCADDIAS: -28 cm/s
LCCADSYS: -97 cm/s
LCCAPDIAS: 31 cm/s
LEFT ECA DIAS: -20 cm/s
LEFT VERTEBRAL DIAS: -27 cm/s
LICADSYS: -97 cm/s
Left CCA prox sys: 145 cm/s
Left ICA dist dias: -34 cm/s
Left ICA prox dias: -19 cm/s
Left ICA prox sys: -80 cm/s
RCCADSYS: -84 cm/s
RCCAPDIAS: 20 cm/s
RCCAPSYS: 115 cm/s
RIGHT ECA DIAS: -21 cm/s
RIGHT VERTEBRAL DIAS: -23 cm/s

## 2016-08-16 ENCOUNTER — Encounter: Payer: Self-pay | Admitting: Family Medicine

## 2016-08-17 ENCOUNTER — Other Ambulatory Visit: Payer: Self-pay | Admitting: Family Medicine

## 2016-08-17 MED ORDER — VALACYCLOVIR HCL 1 G PO TABS: 1000.0000 mg | ORAL_TABLET | Freq: Two times a day (BID) | ORAL | 1 refills | Status: DC | PRN

## 2016-08-17 NOTE — Telephone Encounter (Signed)
Ok to refill Valtrex , original script from Dr. Gilford Rile?

## 2016-08-18 DIAGNOSIS — F4320 Adjustment disorder, unspecified: Secondary | ICD-10-CM | POA: Diagnosis not present

## 2016-08-31 DIAGNOSIS — F4320 Adjustment disorder, unspecified: Secondary | ICD-10-CM | POA: Diagnosis not present

## 2016-11-08 ENCOUNTER — Encounter: Payer: Self-pay | Admitting: Family Medicine

## 2016-11-08 ENCOUNTER — Other Ambulatory Visit: Payer: Self-pay | Admitting: Family Medicine

## 2016-11-08 MED ORDER — DULOXETINE HCL 60 MG PO CPEP: 60.0000 mg | ORAL_CAPSULE | Freq: Every day | ORAL | 3 refills | Status: DC

## 2016-11-24 DIAGNOSIS — H5203 Hypermetropia, bilateral: Secondary | ICD-10-CM | POA: Diagnosis not present

## 2016-11-24 DIAGNOSIS — H2513 Age-related nuclear cataract, bilateral: Secondary | ICD-10-CM | POA: Diagnosis not present

## 2016-11-24 DIAGNOSIS — H04123 Dry eye syndrome of bilateral lacrimal glands: Secondary | ICD-10-CM | POA: Diagnosis not present

## 2016-11-24 DIAGNOSIS — H40023 Open angle with borderline findings, high risk, bilateral: Secondary | ICD-10-CM | POA: Diagnosis not present

## 2016-11-24 DIAGNOSIS — H2181 Floppy iris syndrome: Secondary | ICD-10-CM | POA: Diagnosis not present

## 2016-11-25 ENCOUNTER — Encounter: Payer: Self-pay | Admitting: Family Medicine

## 2016-12-30 ENCOUNTER — Other Ambulatory Visit: Payer: Self-pay | Admitting: *Deleted

## 2016-12-30 ENCOUNTER — Inpatient Hospital Stay: Payer: PPO | Attending: Radiation Oncology

## 2016-12-30 ENCOUNTER — Ambulatory Visit
Admission: RE | Admit: 2016-12-30 | Discharge: 2016-12-30 | Disposition: A | Discharge status: Home / Self Care | Payer: PPO | Source: Ambulatory Visit | Attending: Radiation Oncology | Admitting: Radiation Oncology

## 2016-12-30 ENCOUNTER — Encounter: Payer: Self-pay | Admitting: Radiation Oncology

## 2016-12-30 ENCOUNTER — Ambulatory Visit: Payer: PPO

## 2016-12-30 VITALS — BP 129/82 | HR 69 | Temp 97.1°F | Wt 197.3 lb

## 2016-12-30 DIAGNOSIS — C61 Malignant neoplasm of prostate: Secondary | ICD-10-CM | POA: Insufficient documentation

## 2016-12-30 DIAGNOSIS — Z9079 Acquired absence of other genital organ(s): Secondary | ICD-10-CM | POA: Insufficient documentation

## 2016-12-30 DIAGNOSIS — Z923 Personal history of irradiation: Secondary | ICD-10-CM | POA: Diagnosis not present

## 2016-12-30 LAB — PSA

## 2016-12-30 NOTE — Progress Notes (Signed)
Radiation Oncology Follow up Note  Name: Benjamin Mills   Date:   12/30/2016 MRN:  [id number] DOB: 12-Mar-1946    This 71 y.o. male presents to the clinic today for 16 month follow-up status post salvage radiation therapy for Gleason 7 adenocarcinoma the prostate status post prostatectomy 2007.Marland Kitchen  REFERRING PROVIDER: Coral Spikes, DO  HPI: Patient is a 71 year old male now out 16 months having completed salvage radiation therapy status post prostatectomy back in 2007. He is seen today in routine follow-up and is doing well. He had a PSA drawn today. He specifically denies diarrhea dysuria or any exacerbation of his lower urinary tract symptoms. His last PSA performed 6 months prior was undetectable.   COMPLICATIONS OF TREATMENT: none  FOLLOW UP COMPLIANCE: keeps appointments   PHYSICAL EXAM:  BP 129/82   Pulse 69   Temp (!) 97.1 F (36.2 C)   Wt 197 lb 5 oz (89.5 kg)   BMI 30.90 kg/m  On rectal exam rectal sphincter tone is good prostatic fossa is clear without evidence of nodularity or mass no other rectal abnormality is identified. Well-developed well-nourished patient in NAD. HEENT reveals PERLA, EOMI, discs not visualized.  Oral cavity is clear. No oral mucosal lesions are identified. Neck is clear without evidence of cervical or supraclavicular adenopathy. Lungs are clear to A&P. Cardiac examination is essentially unremarkable with regular rate and rhythm without murmur rub or thrill. Abdomen is benign with no organomegaly or masses noted. Motor sensory and DTR levels are equal and symmetric in the upper and lower extremities. Cranial nerves II through XII are grossly intact. Proprioception is intact. No peripheral adenopathy or edema is identified. No motor or sensory levels are noted. Crude visual fields are within normal range.  RADIOLOGY RESULTS: No current films for review  PLAN: I have run a PSA level on him today and will report that separately. Otherwise I'm please was  overall progress. I've asked to see him back in 1 year for follow-up. Should his PSA be abnormal at all will schedule 6 month follow-up with repeat PSA. All of this was explained in detail to the patient.  I would like to take this opportunity to thank you for allowing me to participate in the care of your patient.Armstead Peaks., MD

## 2017-01-17 ENCOUNTER — Encounter: Payer: Self-pay | Admitting: Family Medicine

## 2017-01-17 DIAGNOSIS — H2512 Age-related nuclear cataract, left eye: Secondary | ICD-10-CM | POA: Diagnosis not present

## 2017-02-01 DIAGNOSIS — H2512 Age-related nuclear cataract, left eye: Secondary | ICD-10-CM | POA: Diagnosis not present

## 2017-02-01 DIAGNOSIS — H25812 Combined forms of age-related cataract, left eye: Secondary | ICD-10-CM | POA: Diagnosis not present

## 2017-02-07 DIAGNOSIS — H2511 Age-related nuclear cataract, right eye: Secondary | ICD-10-CM | POA: Diagnosis not present

## 2017-02-22 ENCOUNTER — Encounter: Payer: Self-pay | Admitting: Family Medicine

## 2017-02-22 DIAGNOSIS — H2181 Floppy iris syndrome: Secondary | ICD-10-CM | POA: Diagnosis not present

## 2017-02-22 DIAGNOSIS — H25811 Combined forms of age-related cataract, right eye: Secondary | ICD-10-CM | POA: Diagnosis not present

## 2017-02-22 DIAGNOSIS — H2511 Age-related nuclear cataract, right eye: Secondary | ICD-10-CM | POA: Diagnosis not present

## 2017-03-08 ENCOUNTER — Encounter: Payer: Self-pay | Admitting: Internal Medicine

## 2017-03-08 ENCOUNTER — Ambulatory Visit (INDEPENDENT_AMBULATORY_CARE_PROVIDER_SITE_OTHER): Payer: PPO

## 2017-03-08 ENCOUNTER — Ambulatory Visit: Payer: PPO | Admitting: Internal Medicine

## 2017-03-08 VITALS — BP 116/74 | HR 77 | Temp 98.2°F | Ht 66.5 in | Wt 190.4 lb

## 2017-03-08 DIAGNOSIS — M545 Low back pain, unspecified: Secondary | ICD-10-CM

## 2017-03-08 DIAGNOSIS — I700 Atherosclerosis of aorta: Secondary | ICD-10-CM | POA: Diagnosis not present

## 2017-03-08 DIAGNOSIS — M25552 Pain in left hip: Secondary | ICD-10-CM

## 2017-03-08 DIAGNOSIS — M4186 Other forms of scoliosis, lumbar region: Secondary | ICD-10-CM | POA: Diagnosis not present

## 2017-03-08 DIAGNOSIS — G4762 Sleep related leg cramps: Secondary | ICD-10-CM | POA: Diagnosis not present

## 2017-03-08 DIAGNOSIS — Z8546 Personal history of malignant neoplasm of prostate: Secondary | ICD-10-CM | POA: Diagnosis not present

## 2017-03-08 LAB — URINALYSIS, ROUTINE W REFLEX MICROSCOPIC
BILIRUBIN URINE: NEGATIVE
HGB URINE DIPSTICK: NEGATIVE
KETONES UR: NEGATIVE
LEUKOCYTES UA: NEGATIVE
Nitrite: NEGATIVE
RBC / HPF: NONE SEEN (ref 0–?)
Specific Gravity, Urine: 1.02 (ref 1.000–1.030)
TOTAL PROTEIN, URINE-UPE24: NEGATIVE
URINE GLUCOSE: NEGATIVE
UROBILINOGEN UA: 0.2 (ref 0.0–1.0)
WBC, UA: NONE SEEN (ref 0–?)
pH: 7 (ref 5.0–8.0)

## 2017-03-08 LAB — CBC
HEMATOCRIT: 37.8 % — AB (ref 39.0–52.0)
HEMOGLOBIN: 12.3 g/dL — AB (ref 13.0–17.0)
MCHC: 32.5 g/dL (ref 30.0–36.0)
MCV: 93.3 fl (ref 78.0–100.0)
PLATELETS: 276 10*3/uL (ref 150.0–400.0)
RBC: 4.05 Mil/uL — ABNORMAL LOW (ref 4.22–5.81)
RDW: 13.6 % (ref 11.5–15.5)
WBC: 7.2 10*3/uL (ref 4.0–10.5)

## 2017-03-08 LAB — COMPREHENSIVE METABOLIC PANEL
ALK PHOS: 79 U/L (ref 39–117)
ALT: 13 U/L (ref 0–53)
AST: 18 U/L (ref 0–37)
Albumin: 4 g/dL (ref 3.5–5.2)
BILIRUBIN TOTAL: 0.5 mg/dL (ref 0.2–1.2)
BUN: 19 mg/dL (ref 6–23)
CALCIUM: 10.3 mg/dL (ref 8.4–10.5)
CO2: 28 mEq/L (ref 19–32)
Chloride: 104 mEq/L (ref 96–112)
Creatinine, Ser: 0.85 mg/dL (ref 0.40–1.50)
GFR: 94.21 mL/min (ref >60.00)
Glucose, Bld: 102 mg/dL — ABNORMAL HIGH (ref 70–99)
Potassium: 4.9 mEq/L (ref 3.5–5.1)
Sodium: 139 mEq/L (ref 135–145)
TOTAL PROTEIN: 6.7 g/dL (ref 6.0–8.3)

## 2017-03-08 LAB — LIPID PANEL
CHOL/HDL RATIO: 4
Cholesterol: 189 mg/dL (ref 0–200)
HDL: 48.8 mg/dL (ref >39.00)
LDL Cholesterol: 116 mg/dL — ABNORMAL HIGH (ref 0–99)
NONHDL: 140
Triglycerides: 121 mg/dL (ref 0.0–149.0)
VLDL: 24.2 mg/dL (ref 0.0–40.0)

## 2017-03-08 LAB — TSH: TSH: 0.74 u[IU]/mL (ref 0.35–4.50)

## 2017-03-08 NOTE — Progress Notes (Signed)
Chief Complaint  Patient presents with  . Hip Pain  . cramps legs/feet   1. Pt c/o leg/feet cramps x 1 month mostly qhs when about to lie down. Cramps last 5-10 min. Walking helps  2. C/o low back pain and left hip pain. Hip pain is 8/10 x 4-5 months bending to put on socks makes worse. Pain is constant and new. Pain is not sharp it feels "wide". Pain rad to upper left thigh with bending. No meds tried no h/o trauma.  3. H/o prostate cancer s/p total prostate removal and recurrent dx'ed 2007 and also returned in 2015. Lupron completed June 2018 and no longer taking will have pt f/u Dr. Erlene Quan.    Review of Systems  Respiratory: Negative for shortness of breath.   Cardiovascular: Negative for chest pain.  Gastrointestinal: Negative for abdominal pain.  Musculoskeletal: Positive for back pain and joint pain.  Skin: Negative for rash.  Neurological:       +leg cramps qhs  Psychiatric/Behavioral: Negative for depression.   Past Medical History:  Diagnosis Date  . Amputation of finger, left    5th finger   . Arthritis    hands  . Cancer [facility]) 2006   Prostate cancer with resection radical prostate removal. Urologist Dr. Hollice Espy locally; prostate ca dx'ed in 2007 and 2015   . Carotid artery stenosis    07/26/16 US carotid b/l 1-39%   . Cataract    s/p surgery b/l Dr. Oralia Manis [facility] 2018  . Depression   . Diverticulosis   . Erectile dysfunction   . Gastric ulcer   . GERD (gastroesophageal reflux disease)   . Hepatic steatosis   . History of splenectomy   . Hyperlipidemia   . Thoracic aortic atherosclerosis ([place])   . Urinary stress incontinence, male    Past Surgical History:  Procedure Laterality Date  . INCONTINENCE SURGERY    . KNEE ARTHROSCOPY N/A 1969   meniscal tear. left   . PROSTATECTOMY  2007   removal of prostate  . REPAIR OF PERFORATED ULCER     2009  . SPLENECTOMY  1953   after trauma, fell out tree as kid  . VENTRAL HERNIA REPAIR     Family  History  Problem Relation Age of Onset  . Dementia Mother   . Glaucoma Mother   . Alcohol abuse Father   . Glaucoma Sister   . Heart disease Brother        heart valve issue  . Multiple sclerosis Brother   . Kidney disease Neg Hx   . Prostate cancer Neg Hx    Social History   Socioeconomic History  . Marital status: Married    Spouse name: Not on file  . Number of children: Not on file  . Years of education: Not on file  . Highest education level: Not on file  Social Needs  . Financial resource strain: Not on file  . Food insecurity - worry: Not on file  . Food insecurity - inability: Not on file  . Transportation needs - medical: Not on file  . Transportation needs - non-medical: Not on file  Occupational History  . Not on file  Tobacco Use  . Smoking status: Former Smoker    Last attempt to quit: 06/05/1978    Years since quitting: 38.7  . Smokeless tobacco: Never Used  . Tobacco comment: quit in 1980s duration 6-7 years 1 ppd no FH lung cancer   Substance and Sexual Activity  .  Alcohol use: Yes    Alcohol/week: 0.0 oz    Comment: OCC  . Drug use: No  . Sexual activity: Not Currently  Other Topics Concern  . Not on file  Social History Narrative   Born in [place].   Lived in [place].    Moved to [facility], brother lives in [place]       Has 2 brothers and 1 sister.      Lives with wife in [place]. Has 3 daughters.      Work - retired, Surveyor, quantity      Diet - regular      Exercise - walks occasionally   Current Meds  Medication Sig  . atropine 1 % ophthalmic solution   . betamethasone dipropionate (DIPROLENE) 0.05 % cream Apply topically as needed. Reported on 06/03/2015  . DULoxetine (CYMBALTA) 60 MG capsule Take 1 capsule (60 mg total) by mouth daily.  Marland Kitchen FLUZONE HIGH-DOSE 0.5 ML injection   . Multiple Vitamin (MULTIVITAMIN) capsule Take 1 capsule by mouth daily.  . valACYclovir (VALTREX) 1000 MG tablet Take 1 tablet (1,000 mg total) by  mouth 2 (two) times daily as needed.   No Known Allergies Recent Results (from the past 2160 hour(s))  PSA     Status: None   Collection Time: 12/30/16  9:17 AM  Result Value Ref Range   Prostatic Specific Antigen <0.01 0.00 - 4.00 ng/mL    Comment: REPEATED TO VERIFY (NOTE) While PSA levels of <=4.0 ng/ml are reported as reference range, some men with levels below 4.0 ng/ml can have prostate cancer and many men with PSA above 4.0 ng/ml do not have prostate cancer.  Other tests such as free PSA, age specific reference ranges, PSA velocity and PSA doubling time may be helpful especially in men less than 60 years old. Performed at [facility], [place] [address], [place], [place] [address]    Objective  Body mass index is 30.27 kg/m. Wt Readings from Last 3 Encounters:  03/08/17 190 lb 6 oz (86.4 kg)  12/30/16 197 lb 5 oz (89.5 kg)  06/24/16 211 lb 5 oz (95.8 kg)   Temp Readings from Last 3 Encounters:  03/08/17 98.2 F (36.8 C) (Oral)  12/30/16 (!) 97.1 F (36.2 C)  06/24/16 (!) 96.9 F (36.1 C)   BP Readings from Last 3 Encounters:  03/08/17 116/74  12/30/16 129/82  06/24/16 115/76   Pulse Readings from Last 3 Encounters:  03/08/17 77  12/30/16 69  06/24/16 97   Physical Exam  Constitutional: He is oriented to person, place, and time and well-developed, well-nourished, and in no distress. Vital signs are normal.  HENT:  Head: Normocephalic and atraumatic.  Mouth/Throat: Oropharynx is clear and moist and mucous membranes are normal.  Eyes: Conjunctivae are normal. Pupils are equal, round, and reactive to light.  Cardiovascular: Normal rate, regular rhythm and normal heart sounds.  Pulmonary/Chest: Effort normal and breath sounds normal.  Musculoskeletal: He exhibits tenderness.       Right shoulder: He exhibits tenderness.  ttp left hip  ttp low back midline neg straight leg test b/l   Neurological: He is alert and oriented to person, place, and time. He  has normal motor skills. Gait normal. Gait normal.  Skin: Skin is warm and dry.  Psychiatric: Mood, memory, affect and judgment normal.  Nursing note and vitals reviewed.  Assessment   1. Left hip pain  2. Low midline back pain w/o sciatica 3. noctural cramps ddx  cramps, RLS, PVD  4. H/o prostate cancer  5. HM  Plan  1.  Xray  Prn Tylenol  2. Xray  Prn tylenol  3.  Increase  Hydration check labs today CMET, CBC, UA, TSH, lipid  4.  Refer back to Dr. Erlene Quan PSA 06/18/16 <0.01 s/p radical prostate removal advised likely doesn't need PSA repeat too early and prostate removed f/u Dr. Alleen Borne lupron 09/2016  5.  Had flu shot 01/26/17 Tdap 2015 Had prevnar and pna 23  Give info about shingrix mailed   Need to get colonoscopy record just have nl per report in chart   Refer urology   Hep C neg 04/08/17   Former smoker quit in 1980s smoked x 6-7 years no FH lung cancer smoked 1 ppd   Specialists  Cardiology Dr. Fletcher Anon   Provider: Dr. Olivia Mackie McLean-Scocuzza

## 2017-03-08 NOTE — Patient Instructions (Addendum)
Please read the information about hip pain and leg cramps  Follow up in 1-2 weeks Take Tylenol 500 mg 1-2 pills for maximum of 3000 mg total in 1 day  We will Xray low back and left hip today  We will refer you to Dr. Erlene Quan (urology/prostate doctor) for follow up   Hip Pain The hip is the joint between the upper legs and the lower pelvis. The bones, cartilage, tendons, and muscles of your hip joint support your body and allow you to move around. Hip pain can range from a minor ache to severe pain in one or both of your hips. The pain may be felt on the inside of the hip joint near the groin, or the outside near the buttocks and upper thigh. You may also have swelling or stiffness. Follow these instructions at home: Managing pain, stiffness, and swelling  If directed, apply ice to the injured area. ? Put ice in a plastic bag. ? Place a towel between your skin and the bag. ? Leave the ice on for 20 minutes, 2-3 times a day  Sleep with a pillow between your legs on your most comfortable side.  Avoid any activities that cause pain. General instructions  Take over-the-counter and prescription medicines only as told by your health care provider.  Do any exercises as told by your health care provider.  Record the following: ? How often you have hip pain. ? The location of your pain. ? What the pain feels like. ? What makes the pain worse.  Keep all follow-up visits as told by your health care provider. This is important. Contact a health care provider if:  You cannot put weight on your leg.  Your pain or swelling continues or gets worse after one week.  It gets harder to walk.  You have a fever. Get help right away if:  You fall.  You have a sudden increase in pain and swelling in your hip.  Your hip is red or swollen or very tender to touch. Summary  Hip pain can range from a minor ache to severe pain in one or both of your hips.  The pain may be felt on the inside of  the hip joint near the groin, or the outside near the buttocks and upper thigh.  Avoid any activities that cause pain.  Record how often you have hip pain, the location of the pain, what makes it worse and what it feels like. This information is not intended to replace advice given to you by your health care provider. Make sure you discuss any questions you have with your health care provider. Document Released: 09/16/2009 Document Revised: 03/01/2016 Document Reviewed: 03/01/2016 Elsevier Interactive Patient Education  2017 Elsevier Inc.  Leg Cramps Leg cramps occur when a muscle or muscles tighten and you have no control over this tightening (involuntary muscle contraction). Muscle cramps can develop in any muscle, but the most common place is in the calf muscles of the leg. Those cramps can occur during exercise or when you are at rest. Leg cramps are painful, and they may last for a few seconds to a few minutes. Cramps may return several times before they finally stop. Usually, leg cramps are not caused by a serious medical problem. In many cases, the cause is not known. Some common causes include:  Overexertion.  Overuse from repetitive motions, or doing the same thing over and over.  Remaining in a certain position for a long period of time.  Improper preparation, form, or technique while performing a sport or an activity.  Dehydration.  Injury.  Side effects of some medicines.  Abnormally low levels of the salts and ions in your blood (electrolytes), especially potassium and calcium. These levels could be low if you are taking water pills (diuretics) or if you are pregnant.  Follow these instructions at home: Watch your condition for any changes. Taking the following actions may help to lessen any discomfort that you are feeling:  Stay well-hydrated. Drink enough fluid to keep your urine clear or pale yellow.  Try massaging, stretching, and relaxing the affected muscle. Do  this for several minutes at a time.  For tight or tense muscles, use a warm towel, heating pad, or hot shower water directed to the affected area.  If you are sore or have pain after a cramp, applying ice to the affected area may relieve discomfort. ? Put ice in a plastic bag. ? Place a towel between your skin and the bag. ? Leave the ice on for 20 minutes, 2-3 times per day.  Avoid strenuous exercise for several days if you have been having frequent leg cramps.  Make sure that your diet includes the essential minerals for your muscles to work normally.  Take medicines only as directed by your health care provider.  Contact a health care provider if:  Your leg cramps get more severe or more frequent, or they do not improve over time.  Your foot becomes cold, numb, or blue. This information is not intended to replace advice given to you by your health care provider. Make sure you discuss any questions you have with your health care provider. Document Released: 05/06/2004 Document Revised: 09/04/2015 Document Reviewed: 03/06/2014 Elsevier Interactive Patient Education  [employer].

## 2017-03-22 ENCOUNTER — Ambulatory Visit: Payer: PPO | Admitting: Urology

## 2017-03-25 ENCOUNTER — Encounter: Payer: Self-pay | Admitting: Urology

## 2017-03-25 ENCOUNTER — Ambulatory Visit: Payer: PPO | Admitting: Urology

## 2017-03-25 VITALS — BP 129/80 | HR 80 | Ht 68.0 in | Wt 185.0 lb

## 2017-03-25 DIAGNOSIS — C61 Malignant neoplasm of prostate: Secondary | ICD-10-CM | POA: Diagnosis not present

## 2017-03-25 DIAGNOSIS — N5231 Erectile dysfunction following radical prostatectomy: Secondary | ICD-10-CM | POA: Diagnosis not present

## 2017-03-25 NOTE — Progress Notes (Signed)
3:57 PM  04/14/17   Benjamin Mills 06-13-1945 [id number]  Referring provider: Coral Spikes, DO [address] [place], [place] [address]  Chief Complaint  Patient presents with  . Prostate Cancer    HPI: 71 year old male with a history of prostate cancer status post robotic prostatectomy in 2007 who returns today for routine follow up.    History of prostate cancer/ rising PSA He was diagnosed and underwent laparoscopic radical prostatectomy at [facility] in [place] back in 2007.  Pathology showed Gleason 3+4, pT2cNxMx.  He developed evidence of biochemical recurrence with negative staging in 05/2015 with a rise in his PSA to 0.2. He underwent salvage radiation with Dr. Baruch Gouty in 07/2015 and started on Lupron x 1 year.  Lupron effect is starting to wear off.  His hot flashes are nearly gone.  He does continue to have a lack of libido.  He has had recent complaints of left hip pain.  He had a x-ray which was negative.   PSA on 04/24/2012 was 0.060 at [facility] PSA drawn by his PCP was 0.11 performed on 07/03/14  0.1 on 11/2014 0.2 on 05/28/15 <0.01 on 12/24/15 <0.01 on 12/2016  ED Severe erectile dysfunction following prostatectomy Previously failed PDE 5 inhibitor secondary to headache Previously briefly discuss intracavernosal injections but declined due to lack of libido which persists  SUI Minimal stress incontinence following mid urethral sling placement in 2014  History of hematuria Cysto negative for any bladder pathology on 09/10/14.  CT Urogram 10/2014 negative.  NED.  PMH: Past Medical History:  Diagnosis Date  . Amputation of finger, left    5th finger   . Arthritis    hands, back lumbar sacral   . Cancer ([place]) 2006   Prostate cancer with resection radical prostate removal. Urologist Dr. Hollice Espy locally; prostate ca dx'ed in 2007 and 2015   . Carotid artery stenosis    07/26/16 US carotid b/l 1-39%   . Cataract    s/p  surgery b/l Dr. Oralia Manis [facility] 2018  . Depression   . Diverticulosis   . Erectile dysfunction   . Gastric ulcer   . GERD (gastroesophageal reflux disease)   . Hepatic steatosis   . History of splenectomy   . Hyperlipidemia   . Thoracic aortic atherosclerosis ([place])   . Urinary stress incontinence, male     Surgical History: Past Surgical History:  Procedure Laterality Date  . INCONTINENCE SURGERY    . KNEE ARTHROSCOPY N/A 1969   meniscal tear. left   . PROSTATECTOMY  2007   removal of prostate  . REPAIR OF PERFORATED ULCER     2009  . SPLENECTOMY  1953   after trauma, fell out tree as kid  . VENTRAL HERNIA REPAIR      Home Medications:  Allergies as of 03/25/2017   No Known Allergies     Medication List        Accurate as of 03/25/17 11:59 PM. Always use your most recent med list.          atropine 1 % ophthalmic solution   betamethasone dipropionate 0.05 % cream Commonly known as:  DIPROLENE Apply topically as needed. Reported on 06/03/2015   DULoxetine 60 MG capsule Commonly known as:  CYMBALTA Take 1 capsule (60 mg total) by mouth daily.   FLUZONE HIGH-DOSE 0.5 ML injection Generic drug:  Influenza vac split quadrivalent PF   multivitamin capsule Take 1 capsule by mouth daily.  sildenafil 20 MG tablet Commonly known as:  REVATIO Take 20 mg by mouth 3 (three) times daily as needed. Reported on 06/03/2015   valACYclovir 1000 MG tablet Commonly known as:  VALTREX Take 1 tablet (1,000 mg total) by mouth 2 (two) times daily as needed.       Allergies: No Known Allergies  Family History: Family History  Problem Relation Age of Onset  . Dementia Mother   . Glaucoma Mother   . Alcohol abuse Father   . Glaucoma Sister   . Heart disease Brother        heart valve issue  . Multiple sclerosis Brother   . Kidney disease Neg Hx   . Prostate cancer Neg Hx     Social History:  reports that he quit smoking about 38 years ago. he has never  used smokeless tobacco. He reports that he drinks alcohol. He reports that he does not use drugs.  ROS: UROLOGY Frequent Urination?: No Hard to postpone urination?: No Burning/pain with urination?: No Get up at night to urinate?: No Leakage of urine?: No Urine stream starts and stops?: No Trouble starting stream?: No Do you have to strain to urinate?: No Blood in urine?: No Urinary tract infection?: No Sexually transmitted disease?: No Injury to kidneys or bladder?: No Painful intercourse?: No Weak stream?: No Erection problems?: No Penile pain?: No Gastrointestinal Nausea?: No Vomiting?: No Indigestion/heartburn?: No Diarrhea?: No Constipation?: No Constitutional Fever: No Night sweats?: No Weight loss?: No Fatigue?: No Skin Skin rash/lesions?: No Itching?: No Eyes Blurred vision?: No Double vision?: No Ears/Nose/Throat Sore throat?: No Sinus problems?: No Hematologic/Lymphatic Swollen glands?: No Easy bruising?: No Cardiovascular Leg swelling?: No Chest pain?: No Respiratory Cough?: No Shortness of breath?: No Endocrine Excessive thirst?: No Musculoskeletal Back pain?: No Joint pain?: Yes Neurological Headaches?: No Dizziness?: No Psychologic Depression?: No Anxiety?: No   Physical Exam: BP 129/80   Pulse 80   Ht 5\' 8"  (1.727 m)   Wt 185 lb (83.9 kg)   BMI 28.13 kg/m   Constitutional:  Alert and oriented, No acute distress.   Cardiovascular: No clubbing, cyanosis, or edema. Respiratory: Normal respiratory effort, no increased work of breathing. GI: Abdomen is soft, nontender, nondistended. Skin: No rashes appreciated. Neurologic: Grossly intact, no focal deficits, moving all 4 extremities. Psychiatric: Normal mood and affect.   Laboratory Data: Lab Results  Component Value Date   WBC 7.2 03/08/2017   HGB 12.3 (L) 03/08/2017   HCT 37.8 (L) 03/08/2017   MCV 93.3 03/08/2017   PLT 276.0 03/08/2017    Lab Results  Component Value  Date   CREATININE 0.85 03/08/2017    Lab Results  Component Value Date   PSA <0.01 06/18/2016   PSA <0.01 12/24/2015   PSA 0.11 07/03/2014   Urinalysis Results for orders placed or performed in visit on 03/08/17  CBC  Result Value Ref Range   WBC 7.2 4.0 - 10.5 K/uL   RBC 4.05 (L) 4.22 - 5.81 Mil/uL   Platelets 276.0 150.0 - 400.0 K/uL   Hemoglobin 12.3 (L) 13.0 - 17.0 g/dL   HCT 37.8 (L) 39.0 - 52.0 %   MCV 93.3 78.0 - 100.0 fl   MCHC 32.5 30.0 - 36.0 g/dL   RDW 13.6 11.5 - 15.5 %  Comprehensive metabolic panel  Result Value Ref Range   Sodium 139 135 - 145 mEq/L   Potassium 4.9 3.5 - 5.1 mEq/L   Chloride 104 96 - 112 mEq/L   CO2 28  19 - 32 mEq/L   Glucose, Bld 102 (H) 70 - 99 mg/dL   BUN 19 6 - 23 mg/dL   Creatinine, Ser 0.85 0.40 - 1.50 mg/dL   Total Bilirubin 0.5 0.2 - 1.2 mg/dL   Alkaline Phosphatase 79 39 - 117 U/L   AST 18 0 - 37 U/L   ALT 13 0 - 53 U/L   Total Protein 6.7 6.0 - 8.3 g/dL   Albumin 4.0 3.5 - 5.2 g/dL   Calcium 10.3 8.4 - 10.5 mg/dL   GFR 94.21 >60.00 mL/min  Urinalysis, Routine w reflex microscopic  Result Value Ref Range   Color, Urine YELLOW Yellow;Lt. Yellow   APPearance CLEAR Clear   Specific Gravity, Urine 1.020 1.000 - 1.030   pH 7.0 5.0 - 8.0   Total Protein, Urine NEGATIVE Negative   Urine Glucose NEGATIVE Negative   Ketones, ur NEGATIVE Negative   Bilirubin Urine NEGATIVE Negative   Hgb urine dipstick NEGATIVE Negative   Urobilinogen, UA 0.2 0.0 - 1.0   Leukocytes, UA NEGATIVE Negative   Nitrite NEGATIVE Negative   WBC, UA none seen 0-2/hpf   RBC / HPF none seen 0-2/hpf  TSH  Result Value Ref Range   TSH 0.74 0.35 - 4.50 uIU/mL  Lipid panel  Result Value Ref Range   Cholesterol 189 0 - 200 mg/dL   Triglycerides 121.0 0.0 - 149.0 mg/dL   HDL 48.80 >39.00 mg/dL   VLDL 24.2 0.0 - 40.0 mg/dL   LDL Cholesterol 116 (H) 0 - 99 mg/dL   Total CHOL/HDL Ratio 4    NonHDL 140.00     Pertinent Imaging: n/a  Assessment & Plan:     1. History of prostate cancer Gleason 3+4, pT2cNxMx s/p laparoscopic radical prostatectomy at [facility] in 2007 With evidence of biochemical recurrence, PSA rise to 0.2 with a doubling time of one year status post salvage radiation completed in 08/2015 along with one year of adjuvant ADT. PSA remains undetectable, currently NED. Recommend PSA q6 months, lab only in 6 months with visit in 1 year Hip pain not concerning for metastatic prostate cancer  2. Erectile dysfunction following radical prostatectomy  Stable severe erectile dysfunction.  I have advised him when he is ready to pursue this, give our office a call and we will arrange for the medication to be called as a compounding pharmacy with return for injection teaching   Return 6 mo lab PSA ,  12 months with MD for PSA.  Hollice Espy, MD  [facility] [address] [place], [place], [place] [address] [phone]

## 2017-03-30 ENCOUNTER — Encounter: Payer: Self-pay | Admitting: Internal Medicine

## 2017-03-30 ENCOUNTER — Ambulatory Visit: Payer: PPO | Admitting: Internal Medicine

## 2017-03-30 VITALS — BP 130/78 | HR 63 | Temp 97.8°F | Wt 189.5 lb

## 2017-03-30 DIAGNOSIS — M25552 Pain in left hip: Secondary | ICD-10-CM

## 2017-03-30 DIAGNOSIS — M1990 Unspecified osteoarthritis, unspecified site: Secondary | ICD-10-CM | POA: Insufficient documentation

## 2017-03-30 DIAGNOSIS — G8929 Other chronic pain: Secondary | ICD-10-CM | POA: Diagnosis not present

## 2017-03-30 DIAGNOSIS — M47817 Spondylosis without myelopathy or radiculopathy, lumbosacral region: Secondary | ICD-10-CM | POA: Diagnosis not present

## 2017-03-30 DIAGNOSIS — M545 Low back pain: Secondary | ICD-10-CM

## 2017-03-30 DIAGNOSIS — Z8546 Personal history of malignant neoplasm of prostate: Secondary | ICD-10-CM

## 2017-03-30 MED ORDER — TRAMADOL HCL 50 MG PO TABS
ORAL_TABLET | ORAL | 0 refills | Status: DC
Start: 2017-03-30 — End: 2017-08-16

## 2017-03-30 NOTE — Progress Notes (Signed)
Chief Complaint  Patient presents with  . Follow-up    left hip pain, arthritis    F/u  1. Left hip pain reviewed Xray neg, Xray low back with scoliosis and multilevel OA changes worse in lumbar sacral spine. Max dose Tylenol has not helped pain he is worried about bone lesions with h/o recurrent prostate cancer. Hip pain worse with movements esp putting shoes or socks on 2. Reviewed labs  3. H/o prostate cancer recurrent disc with urology Dr. Erlene Quan PSA needs to be followed for disease recurrence s/p radical prostate removal. He just had f/u with her 12/14 and has pending appt with rad oncology lab 12/2017 and appt  4. C/o arthritis in hands and painful at times. Will r/o RA and w/u in future. Pt to come back for labs    Review of Systems  Respiratory: Negative for shortness of breath.   Cardiovascular: Negative for chest pain.  Musculoskeletal: Positive for back pain and joint pain.  Skin: Negative for rash.   Past Medical History:  Diagnosis Date  . Amputation of finger, left    5th finger   . Arthritis    hands, back lumbar sacral   . Cancer ([place]) 2006   Prostate cancer with resection radical prostate removal. Urologist Dr. Hollice Espy locally; prostate ca dx'ed in 2007 and 2015   . Carotid artery stenosis    07/26/16 US carotid b/l 1-39%   . Cataract    s/p surgery b/l Dr. Oralia Manis [facility] 2018  . Depression   . Diverticulosis   . Erectile dysfunction   . Gastric ulcer   . GERD (gastroesophageal reflux disease)   . Hepatic steatosis   . History of splenectomy   . Hyperlipidemia   . Thoracic aortic atherosclerosis ([place])   . Urinary stress incontinence, male    Past Surgical History:  Procedure Laterality Date  . INCONTINENCE SURGERY    . KNEE ARTHROSCOPY N/A 1969   meniscal tear. left   . PROSTATECTOMY  2007   removal of prostate  . REPAIR OF PERFORATED ULCER     2009  . SPLENECTOMY  1953   after trauma, fell out tree as kid  . VENTRAL HERNIA REPAIR      Family History  Problem Relation Age of Onset  . Dementia Mother   . Glaucoma Mother   . Alcohol abuse Father   . Glaucoma Sister   . Heart disease Brother        heart valve issue  . Multiple sclerosis Brother   . Kidney disease Neg Hx   . Prostate cancer Neg Hx    Social History   Socioeconomic History  . Marital status: Married    Spouse name: Not on file  . Number of children: Not on file  . Years of education: Not on file  . Highest education level: Not on file  Social Needs  . Financial resource strain: Not on file  . Food insecurity - worry: Not on file  . Food insecurity - inability: Not on file  . Transportation needs - medical: Not on file  . Transportation needs - non-medical: Not on file  Occupational History  . Not on file  Tobacco Use  . Smoking status: Former Smoker    Last attempt to quit: 06/05/1978    Years since quitting: 38.8  . Smokeless tobacco: Never Used  . Tobacco comment: quit in 1980s duration 6-7 years 1 ppd no FH lung cancer   Substance and Sexual  Activity  . Alcohol use: Yes    Alcohol/week: 0.0 oz    Comment: OCC  . Drug use: No  . Sexual activity: Not Currently  Other Topics Concern  . Not on file  Social History Narrative   Born in [place].   Lived in [place].    Moved to [facility], brother lives in [place]       Has 2 brothers and 1 sister.      Lives with wife in [place]. Has 3 daughters.      Work - retired, Surveyor, quantity      Diet - regular      Exercise - walks occasionally   Current Meds  Medication Sig  . betamethasone dipropionate (DIPROLENE) 0.05 % cream Apply topically as needed. Reported on 06/03/2015  . DULoxetine (CYMBALTA) 60 MG capsule Take 1 capsule (60 mg total) by mouth daily.  . Multiple Vitamin (MULTIVITAMIN) capsule Take 1 capsule by mouth daily.  . sildenafil (REVATIO) 20 MG tablet Take 20 mg by mouth 3 (three) times daily as needed. Reported on 06/03/2015  . valACYclovir (VALTREX) 1000  MG tablet Take 1 tablet (1,000 mg total) by mouth 2 (two) times daily as needed.   No Known Allergies Recent Results (from the past 2160 hour(s))  PSA     Status: None   Collection Time: 12/30/16  9:17 AM  Result Value Ref Range   Prostatic Specific Antigen <0.01 0.00 - 4.00 ng/mL    Comment: REPEATED TO VERIFY (NOTE) While PSA levels of <=4.0 ng/ml are reported as reference range, some men with levels below 4.0 ng/ml can have prostate cancer and many men with PSA above 4.0 ng/ml do not have prostate cancer.  Other tests such as free PSA, age specific reference ranges, PSA velocity and PSA doubling time may be helpful especially in men less than 43 years old. Performed at [facility], [place] [address] [place], [place] [address]   CBC     Status: Abnormal   Collection Time: 03/08/17  9:02 AM  Result Value Ref Range   WBC 7.2 4.0 - 10.5 K/uL   RBC 4.05 (L) 4.22 - 5.81 Mil/uL   Platelets 276.0 150.0 - 400.0 K/uL   Hemoglobin 12.3 (L) 13.0 - 17.0 g/dL   HCT 37.8 (L) 39.0 - 52.0 %   MCV 93.3 78.0 - 100.0 fl   MCHC 32.5 30.0 - 36.0 g/dL   RDW 13.6 11.5 - 15.5 %  Comprehensive metabolic panel     Status: Abnormal   Collection Time: 03/08/17  9:02 AM  Result Value Ref Range   Sodium 139 135 - 145 mEq/L   Potassium 4.9 3.5 - 5.1 mEq/L   Chloride 104 96 - 112 mEq/L   CO2 28 19 - 32 mEq/L   Glucose, Bld 102 (H) 70 - 99 mg/dL   BUN 19 6 - 23 mg/dL   Creatinine, Ser 0.85 0.40 - 1.50 mg/dL   Total Bilirubin 0.5 0.2 - 1.2 mg/dL   Alkaline Phosphatase 79 39 - 117 U/L   AST 18 0 - 37 U/L   ALT 13 0 - 53 U/L   Total Protein 6.7 6.0 - 8.3 g/dL   Albumin 4.0 3.5 - 5.2 g/dL   Calcium 10.3 8.4 - 10.5 mg/dL   GFR 94.21 >60.00 mL/min  Urinalysis, Routine w reflex microscopic     Status: None   Collection Time: 03/08/17  9:02 AM  Result Value Ref Range   Color, Urine  YELLOW Yellow;Lt. Yellow   APPearance CLEAR Clear   Specific Gravity, Urine 1.020 1.000 - 1.030   pH 7.0 5.0 - 8.0    Total Protein, Urine NEGATIVE Negative   Urine Glucose NEGATIVE Negative   Ketones, ur NEGATIVE Negative   Bilirubin Urine NEGATIVE Negative   Hgb urine dipstick NEGATIVE Negative   Urobilinogen, UA 0.2 0.0 - 1.0   Leukocytes, UA NEGATIVE Negative   Nitrite NEGATIVE Negative   WBC, UA none seen 0-2/hpf   RBC / HPF none seen 0-2/hpf  TSH     Status: None   Collection Time: 03/08/17  9:02 AM  Result Value Ref Range   TSH 0.74 0.35 - 4.50 uIU/mL  Lipid panel     Status: Abnormal   Collection Time: 03/08/17  9:02 AM  Result Value Ref Range   Cholesterol 189 0 - 200 mg/dL    Comment: ATP III Classification       Desirable:  < 200 mg/dL               Borderline High:  200 - 239 mg/dL          High:  > = 240 mg/dL   Triglycerides 121.0 0.0 - 149.0 mg/dL    Comment: Normal:  <150 mg/dLBorderline High:  150 - 199 mg/dL   HDL 48.80 >39.00 mg/dL   VLDL 24.2 0.0 - 40.0 mg/dL   LDL Cholesterol 116 (H) 0 - 99 mg/dL   Total CHOL/HDL Ratio 4     Comment:                Men          Women1/2 Average Risk     3.4          3.3Average Risk          5.0          4.42X Average Risk          9.6          7.13X Average Risk          15.0          11.0                       NonHDL 140.00     Comment: NOTE:  Non-HDL goal should be 30 mg/dL higher than patient's LDL goal (i.e. LDL goal of < 70 mg/dL, would have non-HDL goal of < 100 mg/dL)   Objective  Body mass index is 28.81 kg/m. Wt Readings from Last 3 Encounters:  03/30/17 189 lb 8 oz (86 kg)  03/25/17 185 lb (83.9 kg)  03/08/17 190 lb 6 oz (86.4 kg)   Temp Readings from Last 3 Encounters:  03/30/17 97.8 F (36.6 C) (Oral)  03/08/17 98.2 F (36.8 C) (Oral)  12/30/16 (!) 97.1 F (36.2 C)   BP Readings from Last 3 Encounters:  03/30/17 130/78  03/25/17 129/80  03/08/17 116/74   Pulse Readings from Last 3 Encounters:  03/30/17 63  03/25/17 80  03/08/17 77   O2 saturation 97%  Physical Exam  Constitutional: He is oriented to person,  place, and time and well-developed, well-nourished, and in no distress. Vital signs are normal.  HENT:  Head: Normocephalic and atraumatic.  Mouth/Throat: Oropharynx is clear and moist and mucous membranes are normal.  Eyes: Conjunctivae are normal. Pupils are equal, round, and reactive to light.  Cardiovascular: Normal rate and regular rhythm.  Pulmonary/Chest: Effort normal  and breath sounds normal.  Musculoskeletal: He exhibits tenderness.       Legs: Neurological: He is alert and oriented to person, place, and time. Gait normal. Gait normal.  Skin: Skin is warm and dry.  Psychiatric: Mood, memory, affect and judgment normal.  Nursing note and vitals reviewed.   Assessment   1. L hip pain Xray neg and Xray back with scoliosis and multilevel OA changes worse LS spine Rx Tramadol qd to bid prn #10 no refills no relief with Tylenol 2. H/o recurrent prostate cancer  3. Arthritis hands r/o RA vs OA  4. HM  Plan  1. CT left hip and lumbar back w/o contrast spoke to radiology  If negative will order bone scan  2. F/u Rad onc 12/2017 just saw urology 03/25/17  3. Will r/o RA if neg then OA  4. See Hm 03/08/17   Pt to get colonoscopy records from home from [place] and bring back   Calc CT risk score in future.   Provider: Dr. Olivia Mackie McLean-Scocuzza-Internal Medicine

## 2017-03-30 NOTE — Patient Instructions (Addendum)
We will order CT hip and  Low back if negative we will order bone scan  Please follow up in 2-3 weeks sooner if needed  Come in w/in that time frame and get arthritis labs    Hip Pain The hip is the joint between the upper legs and the lower pelvis. The bones, cartilage, tendons, and muscles of your hip joint support your body and allow you to move around. Hip pain can range from a minor ache to severe pain in one or both of your hips. The pain may be felt on the inside of the hip joint near the groin, or the outside near the buttocks and upper thigh. You may also have swelling or stiffness. Follow these instructions at home: Managing pain, stiffness, and swelling  If directed, apply ice to the injured area. ? Put ice in a plastic bag. ? Place a towel between your skin and the bag. ? Leave the ice on for 20 minutes, 2-3 times a day  Sleep with a pillow between your legs on your most comfortable side.  Avoid any activities that cause pain. General instructions  Take over-the-counter and prescription medicines only as told by your health care provider.  Do any exercises as told by your health care provider.  Record the following: ? How often you have hip pain. ? The location of your pain. ? What the pain feels like. ? What makes the pain worse.  Keep all follow-up visits as told by your health care provider. This is important. Contact a health care provider if:  You cannot put weight on your leg.  Your pain or swelling continues or gets worse after one week.  It gets harder to walk.  You have a fever. Get help right away if:  You fall.  You have a sudden increase in pain and swelling in your hip.  Your hip is red or swollen or very tender to touch. Summary  Hip pain can range from a minor ache to severe pain in one or both of your hips.  The pain may be felt on the inside of the hip joint near the groin, or the outside near the buttocks and upper thigh.  Avoid any  activities that cause pain.  Record how often you have hip pain, the location of the pain, what makes it worse and what it feels like. This information is not intended to replace advice given to you by your health care provider. Make sure you discuss any questions you have with your health care provider. Document Released: 09/16/2009 Document Revised: 03/01/2016 Document Reviewed: 03/01/2016 Elsevier Interactive Patient Education  [employer].

## 2017-04-08 ENCOUNTER — Ambulatory Visit: Payer: PPO

## 2017-04-13 ENCOUNTER — Ambulatory Visit
Admission: RE | Admit: 2017-04-13 | Discharge: 2017-04-13 | Disposition: A | Discharge status: Home / Self Care | Payer: PPO | Source: Ambulatory Visit | Attending: Internal Medicine | Admitting: Internal Medicine

## 2017-04-13 DIAGNOSIS — M48061 Spinal stenosis, lumbar region without neurogenic claudication: Secondary | ICD-10-CM | POA: Diagnosis not present

## 2017-04-13 DIAGNOSIS — I700 Atherosclerosis of aorta: Secondary | ICD-10-CM | POA: Insufficient documentation

## 2017-04-13 DIAGNOSIS — M4186 Other forms of scoliosis, lumbar region: Secondary | ICD-10-CM | POA: Insufficient documentation

## 2017-04-13 DIAGNOSIS — Z9079 Acquired absence of other genital organ(s): Secondary | ICD-10-CM | POA: Insufficient documentation

## 2017-04-13 DIAGNOSIS — M25552 Pain in left hip: Secondary | ICD-10-CM | POA: Diagnosis not present

## 2017-04-13 DIAGNOSIS — G8929 Other chronic pain: Secondary | ICD-10-CM

## 2017-04-13 DIAGNOSIS — K5730 Diverticulosis of large intestine without perforation or abscess without bleeding: Secondary | ICD-10-CM | POA: Diagnosis not present

## 2017-04-13 DIAGNOSIS — M545 Low back pain: Secondary | ICD-10-CM

## 2017-04-13 DIAGNOSIS — M5126 Other intervertebral disc displacement, lumbar region: Secondary | ICD-10-CM | POA: Diagnosis not present

## 2017-04-13 DIAGNOSIS — M47817 Spondylosis without myelopathy or radiculopathy, lumbosacral region: Secondary | ICD-10-CM | POA: Diagnosis not present

## 2017-04-21 ENCOUNTER — Other Ambulatory Visit: Payer: PPO

## 2017-04-22 ENCOUNTER — Other Ambulatory Visit: Payer: PPO

## 2017-04-27 ENCOUNTER — Ambulatory Visit: Payer: PPO | Admitting: Internal Medicine

## 2017-04-28 ENCOUNTER — Telehealth: Payer: Self-pay

## 2017-04-28 ENCOUNTER — Ambulatory Visit: Payer: PPO | Admitting: Internal Medicine

## 2017-04-28 NOTE — Telephone Encounter (Signed)
Patient called and advised of results for CT Lumbar Spine , CT Hip Left done on 04/13/17.  He states his wife has been in hospital and he hasn't had time to review results or return call.  He will review results and let us know what he would like to .  He may want to see Dr Erlene Quan.

## 2017-04-29 NOTE — Telephone Encounter (Signed)
Understand please try to get back with [place] his next appt with Dr. Erlene Quan is 03/2018 make it sooner if needed  I would consider bone scan and let me know if he wants me to order   Thanks [place]

## 2017-05-01 ENCOUNTER — Encounter: Payer: Self-pay | Admitting: Urology

## 2017-05-04 NOTE — Telephone Encounter (Signed)
Understand please try to get back with [place] his next appt with Dr. Erlene Quan is 03/2018 make it sooner if needed  I would consider bone scan and let me know if he wants me to order   Thanks [place]

## 2017-05-04 NOTE — Telephone Encounter (Signed)
Left voice mail to call back ok for [facility] to speak to patient 

## 2017-05-06 ENCOUNTER — Encounter: Payer: Self-pay | Admitting: *Deleted

## 2017-05-06 NOTE — Telephone Encounter (Signed)
My Chart message sent

## 2017-06-02 NOTE — Addendum Note (Signed)
Encounter addended by: Noreene Filbert, MD on: 06/02/2017 12:50 PM  Actions taken: Sign clinical note

## 2017-06-02 NOTE — Progress Notes (Signed)
Radiation Oncology Follow up Note  Name: Benjamin Mills   Date:   06/24/2016 MRN:  [id number] DOB: Feb 26, 1946    This 72 y.o. male presents to the clinic today for 10 month follow-up status post salvage radiation therapy for Gleason 7 adenocarcinoma the prostate status post prostatectomy in 2007.  REFERRING PROVIDER: Jackolyn Confer, MD  HPI: Patient is a 72 year old male now out close to 10 months having completed IM RT radiation therapy for Gleason 7 (3+4 adenocarcinoma prostate status post robotic-assisted prostatectomy 2007. He is seen today in routine follow-up is doing well.. Patient does have urinary incontinence secondary to his prostatectomy. He had a PSA recently drawn which was undetectable less than 7.35.  COMPLICATIONS OF TREATMENT: none  FOLLOW UP COMPLIANCE: keeps appointments   PHYSICAL EXAM:  BP 115/76   Pulse 97   Temp (!) 96.9 F (36.1 C)   Resp 20   Wt 211 lb 5 oz (95.8 kg)   BMI 33.10 kg/m  On rectal exam prostatic fossa is clear without evidence of nodularity or mass. No other rectal abnormalities identified. Well-developed well-nourished patient in NAD. HEENT reveals PERLA, EOMI, discs not visualized.  Oral cavity is clear. No oral mucosal lesions are identified. Neck is clear without evidence of cervical or supraclavicular adenopathy. Lungs are clear to A&P. Cardiac examination is essentially unremarkable with regular rate and rhythm without murmur rub or thrill. Abdomen is benign with no organomegaly or masses noted. Motor sensory and DTR levels are equal and symmetric in the upper and lower extremities. Cranial nerves II through XII are grossly intact. Proprioception is intact. No peripheral adenopathy or edema is identified. No motor or sensory levels are noted. Crude visual fields are within normal range.  RADIOLOGY RESULTS: No current films for review  PLAN: Present time patient is under excellent biochemical control of his prostate cancer. I've asked  to see him back in 6 months and will obtain a PSA prior to that visit. Patient is to call with any concerns.  I would like to take this opportunity to thank you for allowing me to participate in the care of your patient.Noreene Filbert, MD

## 2017-06-13 ENCOUNTER — Other Ambulatory Visit: Payer: Self-pay | Admitting: Internal Medicine

## 2017-06-13 ENCOUNTER — Telehealth: Payer: Self-pay | Admitting: Internal Medicine

## 2017-06-13 NOTE — Telephone Encounter (Signed)
Pt came in today to disc wife Mrs. Montoya who is in and out for OM in hospital. He wants to apologize on her belhalf she has not been here and wants me to know he and she are coming. She is not getting better and wants to know what to do  Advised pt he can disc with hospital possibly transfer to [facility] tertiary care center if she is not getting better. He does not know what to do at this time and also thinks he will care Medicare   [place]

## 2017-07-01 DIAGNOSIS — H2181 Floppy iris syndrome: Secondary | ICD-10-CM | POA: Diagnosis not present

## 2017-07-01 DIAGNOSIS — H04123 Dry eye syndrome of bilateral lacrimal glands: Secondary | ICD-10-CM | POA: Diagnosis not present

## 2017-07-01 DIAGNOSIS — H5203 Hypermetropia, bilateral: Secondary | ICD-10-CM | POA: Diagnosis not present

## 2017-07-01 DIAGNOSIS — Z961 Presence of intraocular lens: Secondary | ICD-10-CM | POA: Diagnosis not present

## 2017-07-01 DIAGNOSIS — H40023 Open angle with borderline findings, high risk, bilateral: Secondary | ICD-10-CM | POA: Diagnosis not present

## 2017-08-10 ENCOUNTER — Encounter: Payer: Self-pay | Admitting: *Deleted

## 2017-08-16 ENCOUNTER — Encounter: Payer: Self-pay | Admitting: Internal Medicine

## 2017-08-16 ENCOUNTER — Ambulatory Visit (INDEPENDENT_AMBULATORY_CARE_PROVIDER_SITE_OTHER): Payer: PPO | Admitting: Internal Medicine

## 2017-08-16 VITALS — BP 120/76 | HR 77 | Temp 97.8°F | Ht 68.0 in | Wt 186.2 lb

## 2017-08-16 DIAGNOSIS — M4805 Spinal stenosis, thoracolumbar region: Secondary | ICD-10-CM | POA: Diagnosis not present

## 2017-08-16 DIAGNOSIS — D229 Melanocytic nevi, unspecified: Secondary | ICD-10-CM

## 2017-08-16 DIAGNOSIS — M1990 Unspecified osteoarthritis, unspecified site: Secondary | ICD-10-CM | POA: Diagnosis not present

## 2017-08-16 DIAGNOSIS — E559 Vitamin D deficiency, unspecified: Secondary | ICD-10-CM

## 2017-08-16 DIAGNOSIS — L821 Other seborrheic keratosis: Secondary | ICD-10-CM | POA: Insufficient documentation

## 2017-08-16 DIAGNOSIS — M21612 Bunion of left foot: Secondary | ICD-10-CM | POA: Insufficient documentation

## 2017-08-16 DIAGNOSIS — Z1283 Encounter for screening for malignant neoplasm of skin: Secondary | ICD-10-CM | POA: Diagnosis not present

## 2017-08-16 DIAGNOSIS — M48061 Spinal stenosis, lumbar region without neurogenic claudication: Secondary | ICD-10-CM | POA: Insufficient documentation

## 2017-08-16 DIAGNOSIS — M2550 Pain in unspecified joint: Secondary | ICD-10-CM | POA: Diagnosis not present

## 2017-08-16 HISTORY — DX: Spinal stenosis, lumbar region without neurogenic claudication: M48.061

## 2017-08-16 HISTORY — DX: Bunion of left foot: M21.612

## 2017-08-16 HISTORY — DX: Other seborrheic keratosis: L82.1

## 2017-08-16 MED ORDER — MELOXICAM 7.5 MG PO TABS: 7.5000 mg | ORAL_TABLET | Freq: Every day | ORAL | 2 refills | Status: DC

## 2017-08-16 NOTE — Progress Notes (Signed)
Pre visit review using our clinic review tool, if applicable. No additional management support is needed unless otherwise documented below in the visit note. 

## 2017-08-16 NOTE — Progress Notes (Signed)
Chief Complaint  Patient presents with  . Follow-up   F/u  1. Low back pain reviewed CT low back +mod SS and deg changes no bone lesions. Back pain at times 7/10 worse with sitting still and better with movement 2. Joint pain b/l thumbs R>L and left wlbow 3 c/o left foot and toe is turning out  4. Wife noticed lesions to back and will refer to derm for tbse   Review of Systems  Constitutional: Negative for weight loss.  HENT: Negative for hearing loss.   Eyes: Negative for blurred vision.  Respiratory: Negative for shortness of breath.   Cardiovascular: Negative for chest pain.  Musculoskeletal: Positive for back pain and joint pain.  Skin: Negative for rash.  Psychiatric/Behavioral: Negative for depression.   Past Medical History:  Diagnosis Date  . Amputation of finger, left    5th finger   . Arthritis    hands, back lumbar sacral   . Cancer ([place]) 2006   Prostate cancer with resection radical prostate removal. Urologist Dr. Hollice Espy locally; prostate ca dx'ed in 2007 and 2015   . Carotid artery stenosis    07/26/16 US carotid b/l 1-39%   . Cataract    s/p surgery b/l Dr. Oralia Manis [facility] 2018  . Depression   . Diverticulosis   . Erectile dysfunction   . Gastric ulcer   . GERD (gastroesophageal reflux disease)   . Hepatic steatosis   . History of splenectomy   . Hyperlipidemia   . Thoracic aortic atherosclerosis ([place])   . Urinary stress incontinence, male    Past Surgical History:  Procedure Laterality Date  . INCONTINENCE SURGERY    . KNEE ARTHROSCOPY N/A 1969   meniscal tear. left   . PROSTATECTOMY  2007   removal of prostate  . REPAIR OF PERFORATED ULCER     2009  . SPLENECTOMY  1953   after trauma, fell out tree as kid  . VENTRAL HERNIA REPAIR     Family History  Problem Relation Age of Onset  . Dementia Mother   . Glaucoma Mother   . Alcohol abuse Father   . Glaucoma Sister   . Heart disease Brother        heart valve issue  . Multiple  sclerosis Brother   . Kidney disease Neg Hx   . Prostate cancer Neg Hx    Social History   Socioeconomic History  . Marital status: Married    Spouse name: Not on file  . Number of children: Not on file  . Years of education: Not on file  . Highest education level: Not on file  Occupational History  . Not on file  Social Needs  . Financial resource strain: Not on file  . Food insecurity:    Worry: Not on file    Inability: Not on file  . Transportation needs:    Medical: Not on file    Non-medical: Not on file  Tobacco Use  . Smoking status: Former Smoker    Last attempt to quit: 06/05/1978    Years since quitting: 39.2  . Smokeless tobacco: Never Used  . Tobacco comment: quit in 1980s duration 6-7 years 1 ppd no FH lung cancer   Substance and Sexual Activity  . Alcohol use: Yes    Alcohol/week: 0.0 oz    Comment: OCC  . Drug use: No  . Sexual activity: Not Currently  Lifestyle  . Physical activity:    Days per week: Not  on file    Minutes per session: Not on file  . Stress: Not on file  Relationships  . Social connections:    Talks on phone: Not on file    Gets together: Not on file    Attends religious service: Not on file    Active member of club or organization: Not on file    Attends meetings of clubs or organizations: Not on file    Relationship status: Not on file  . Intimate partner violence:    Fear of current or ex partner: Not on file    Emotionally abused: Not on file    Physically abused: Not on file    Forced sexual activity: Not on file  Other Topics Concern  . Not on file  Social History Narrative   Born in [place].   Lived in [place].    Moved to [facility], brother lives in [place] another [place]       Has 2 brothers and 1 sister.      Lives with wife in [place]. Has 3 daughters.      Work - retired, Surveyor, quantity      Diet - regular      Exercise - walks occasionally   Current Meds  Medication Sig  . betamethasone dipropionate  (DIPROLENE) 0.05 % cream Apply topically as needed. Reported on 06/03/2015  . DULoxetine (CYMBALTA) 60 MG capsule Take 1 capsule (60 mg total) by mouth daily.  . IRON PO Take by mouth.  . Multiple Vitamin (MULTIVITAMIN) capsule Take 1 capsule by mouth daily.  . sildenafil (REVATIO) 20 MG tablet Take 20 mg by mouth 3 (three) times daily as needed. Reported on 06/03/2015  . traMADol (ULTRAM) 50 MG tablet Qd to bid prn  . valACYclovir (VALTREX) 1000 MG tablet Take 1 tablet (1,000 mg total) by mouth 2 (two) times daily as needed.   No Known Allergies No results found for this or any previous visit (from the past 2160 hour(s)). Objective  Body mass index is 28.31 kg/m. Wt Readings from Last 3 Encounters:  08/16/17 186 lb 3.2 oz (84.5 kg)  03/30/17 189 lb 8 oz (86 kg)  03/25/17 185 lb (83.9 kg)   Temp Readings from Last 3 Encounters:  08/16/17 97.8 F (36.6 C) (Oral)  03/30/17 97.8 F (36.6 C) (Oral)  03/08/17 98.2 F (36.8 C) (Oral)   BP Readings from Last 3 Encounters:  08/16/17 120/76  03/30/17 130/78  03/25/17 129/80   Pulse Readings from Last 3 Encounters:  08/16/17 77  03/30/17 63  03/25/17 80    Physical Exam  Constitutional: He is oriented to person, place, and time. Vital signs are normal. He appears well-developed and well-nourished. He is cooperative.  HENT:  Head: Normocephalic and atraumatic.  Mouth/Throat: Oropharynx is clear and moist and mucous membranes are normal.  Eyes: Pupils are equal, round, and reactive to light. Conjunctivae are normal.  Cardiovascular: Normal rate, regular rhythm and normal heart sounds.  Pulmonary/Chest: Effort normal and breath sounds normal.  Musculoskeletal:  Bunion left foot with great toe turning outward  Neurological: He is alert and oriented to person, place, and time. Gait normal.  Skin: Skin is warm, dry and intact.  Psychiatric: He has a normal mood and affect. His speech is normal and behavior is normal. Judgment and  thought content normal. Cognition and memory are normal.  Nursing note and vitals reviewed.   Assessment   1. CT  + moderate spinal stenosis and deg changes  2 .  arthritis likely in back, b/l thumbs  3. Bunion left great toe  4. sks and nevi to back  5. HM Plan   1.  Prn Mobic tramadol didn't help  2.  W/u RA today  3. Disc wider shoes cushion in btwn 1st and 2nd toes left  Insoles  4. Refer to Dr. Rutherford Guys  5.  Had flu shot 01/26/17 Tdap 2015 Had prevnar and pna 23  shingrix Rx today   Need to get colonoscopy record just have nl per report in chart given release of records for pt to mail back today  Pending urology. Rad onc appts   Hep C neg 04/08/17   Former smoker quit in 1980s smoked x 6-7 years no FH lung cancer smoked 1 ppd   Specialists  Cardiology Dr. Fletcher Anon    Provider: Dr. Olivia Mackie McLean-Scocuzza-Internal Medicine

## 2017-08-16 NOTE — Patient Instructions (Addendum)
Try insoles for shoes and cushion for 1st and 2nd toe in between on the left foot  Try wider shoes  We will check labs today  Please find out the name of your GI doctor who did your colonoscopy and mail the form back to [place] so we can get records  I referred you to [facility] [address].  See you in 02/2018   Bunion A bunion is a bump on the base of the big toe that forms when the bones of the big toe joint move out of position. Bunions may be small at first, but they often get larger over time. The can make walking painful. What are the causes? A bunion may be caused by:  Wearing narrow or pointed shoes that force the big toe to press against the other toes.  Abnormal foot development that causes the foot to roll inward (pronate).  Changes in the foot that are caused by certain diseases, such as rheumatoid arthritis and polio.  A foot injury.  What increases the risk? The following factors may make you more likely to develop this condition:  Wearing shoes that squeeze the toes together.  Having certain diseases, such as: ? Rheumatoid arthritis. ? Polio. ? Cerebral palsy.  Having family members who have bunions.  Being born with a foot deformity, such as flat feet or low arches.  Doing activities that put a lot of pressure on the feet, such as ballet dancing.  What are the signs or symptoms? The main symptom of a bunion is a noticeable bump on the big toe. Other symptoms may include:  Pain.  Swelling around the big toe.  Redness and inflammation.  Thick or hardened skin on the big toe or between the toes.  Stiffness or loss of motion in the big toe.  Trouble with walking.  How is this diagnosed? A bunion may be diagnosed based on your symptoms, medical history, and activities. You may have tests, such as:  X-rays. These allow your health care provider to check the position of the bones in your foot and look for damage to your joint. They also help your health  care provider to determine the severity of your bunion and the best way to treat it.  Joint aspiration. In this test, a sample of fluid is removed from the toe joint. This test, which may be done if you are in a lot of pain, helps to rule out diseases that cause painful swelling of the joints, such as arthritis.  How is this treated? There is no cure for a bunion, but treatment can help to prevent a bunion from getting worse. Treatment depends on the severity of your symptoms. Your health care provider may recommend:  Wearing shoes that have a wide toe box.  Using bunion pads to cushion the affected area.  Taping your toes together to keep them in a normal position.  Placing a device inside your shoe (orthotics) to help reduce pressure on your toe joint.  Taking medicine to ease pain, inflammation, and swelling.  Applying heat or ice to the affected area.  Doing stretching exercises.  Surgery to remove scar tissue and move the toes back into their normal position. This treatment is rare.  Follow these instructions at home:  Support your toe joint with proper footwear, shoe padding, or taping as told by your health care provider.  Take over-the-counter and prescription medicines only as told by your health care provider.  If directed, apply ice to the  injured area: ? Put ice in a plastic bag. ? Place a towel between your skin and the bag. ? Leave the ice on for 20 minutes, 2-3 times per day.  If directed, apply heat to the affected area before you exercise. Use the heat source that your health care provider recommends, such as a moist heat pack or a heating pad. ? Place a towel between your skin and the heat source. ? Leave the heat on for 20-30 minutes. ? Remove the heat if your skin turns bright red. This is especially important if you are unable to feel pain, heat, or cold. You may have a greater risk of getting burned.  Do exercises as told by your health care  provider.  Keep all follow-up visits as told by your health care provider. Contact a health care provider if:  Your symptoms get worse.  Your symptoms do not improve in 2 weeks. Get help right away if:  You have severe pain and trouble with walking. This information is not intended to replace advice given to you by your health care provider. Make sure you discuss any questions you have with your health care provider. Document Released: 03/29/2005 Document Revised: 09/04/2015 Document Reviewed: 10/27/2014 Elsevier Interactive Patient Education  2018 [place] is a term that is commonly used to refer to joint pain or joint disease. There are more than 100 types of arthritis. What are the causes? The most common cause of this condition is wear and tear of a joint. Other causes include:  Gout.  Inflammation of a joint.  An infection of a joint.  Sprains and other injuries near the joint.  A drug reaction or allergic reaction.  In some cases, the cause may not be known. What are the signs or symptoms? The main symptom of this condition is pain in the joint with movement. Other symptoms include:  Redness, swelling, or stiffness at a joint.  Warmth coming from the joint.  Fever.  Overall feeling of illness.  How is this diagnosed? This condition may be diagnosed with a physical exam and tests, including:  Blood tests.  Urine tests.  Imaging tests, such as MRI, X-rays, or a CT scan.  Sometimes, fluid is removed from a joint for testing. How is this treated? Treatment for this condition may involve:  Treatment of the cause, if it is known.  Rest.  Raising (elevating) the joint.  Applying cold or hot packs to the joint.  Medicines to improve symptoms and reduce inflammation.  Injections of a steroid such as cortisone into the joint to help reduce pain and inflammation.  Depending on the cause of your arthritis, you may need to make  lifestyle changes to reduce stress on your joint. These changes may include exercising more and losing weight. Follow these instructions at home: Medicines  Take over-the-counter and prescription medicines only as told by your health care provider.  Do not take aspirin to relieve pain if gout is suspected. Activity  Rest your joint if told by your health care provider. Rest is important when your disease is active and your joint feels painful, swollen, or stiff.  Avoid activities that make the pain worse. It is important to balance activity with rest.  Exercise your joint regularly with range-of-motion exercises as told by your health care provider. Try doing low-impact exercise, such as: ? Swimming. ? Water aerobics. ? Biking. ? Walking. Joint Care   If your joint is swollen, keep it elevated if  told by your health care provider.  If your joint feels stiff in the morning, try taking a warm shower.  If directed, apply heat to the joint. If you have diabetes, do not apply heat without permission from your health care provider. ? Put a towel between the joint and the hot pack or heating pad. ? Leave the heat on the area for 20-30 minutes.  If directed, apply ice to the joint: ? Put ice in a plastic bag. ? Place a towel between your skin and the bag. ? Leave the ice on for 20 minutes, 2-3 times per day.  Keep all follow-up visits as told by your health care provider. This is important. Contact a health care provider if:  The pain gets worse.  You have a fever. Get help right away if:  You develop severe joint pain, swelling, or redness.  Many joints become painful and swollen.  You develop severe back pain.  You develop severe weakness in your leg.  You cannot control your bladder or bowels. This information is not intended to replace advice given to you by your health care provider. Make sure you discuss any questions you have with your health care provider. Document  Released: 05/06/2004 Document Revised: 09/04/2015 Document Reviewed: 06/24/2014 Elsevier Interactive Patient Education  [employer].

## 2017-08-17 LAB — CYCLIC CITRUL PEPTIDE ANTIBODY, IGG: Cyclic Citrullin Peptide Ab: <16 UNITS

## 2017-08-17 LAB — RHEUMATOID FACTOR

## 2017-08-17 LAB — C-REACTIVE PROTEIN: CRP: 0.3 mg/dL — ABNORMAL LOW (ref 0.5–20.0)

## 2017-08-17 LAB — VITAMIN D 25 HYDROXY (VIT D DEFICIENCY, FRACTURES): VITD: 50.04 ng/mL (ref 30.00–100.00)

## 2017-08-17 LAB — SEDIMENTATION RATE: Sed Rate: 21 mm/hr — ABNORMAL HIGH (ref 0–20)

## 2017-09-21 ENCOUNTER — Other Ambulatory Visit: Payer: PPO

## 2017-09-21 DIAGNOSIS — C61 Malignant neoplasm of prostate: Secondary | ICD-10-CM | POA: Diagnosis not present

## 2017-09-22 LAB — PSA

## 2017-11-08 ENCOUNTER — Telehealth: Payer: Self-pay

## 2017-11-08 NOTE — Telephone Encounter (Signed)
Copied from [place] [id number]. Topic: Appointment Scheduling - Scheduling Inquiry for Clinic >> Nov 08, 2017  8:18 AM Synthia Innocent wrote: Reason for CRM: Patient having right thumb pain, requesting to be worked in with Dr Terese Door, requesting sometime next week. She has seen him for this prior. Please advise  Called patient back and he states that he was taking medication that was prescribed for the pain ( mobic) started off taking one and increased to 2 with not much relief. Is there another option for him to take for relief he mentioned that Dr. Aundra Dubin said something about injections. Please let patient know options for his thumb pain that he has been seen for prior.

## 2017-11-09 ENCOUNTER — Ambulatory Visit (INDEPENDENT_AMBULATORY_CARE_PROVIDER_SITE_OTHER): Payer: PPO

## 2017-11-09 ENCOUNTER — Encounter: Payer: Self-pay | Admitting: Internal Medicine

## 2017-11-09 ENCOUNTER — Ambulatory Visit (INDEPENDENT_AMBULATORY_CARE_PROVIDER_SITE_OTHER): Payer: PPO | Admitting: Internal Medicine

## 2017-11-09 ENCOUNTER — Other Ambulatory Visit: Payer: Self-pay

## 2017-11-09 ENCOUNTER — Telehealth: Payer: Self-pay | Admitting: *Deleted

## 2017-11-09 VITALS — BP 118/82 | HR 79 | Temp 98.0°F | Ht 68.0 in | Wt 190.4 lb

## 2017-11-09 DIAGNOSIS — D649 Anemia, unspecified: Secondary | ICD-10-CM | POA: Diagnosis not present

## 2017-11-09 DIAGNOSIS — M4805 Spinal stenosis, thoracolumbar region: Secondary | ICD-10-CM | POA: Diagnosis not present

## 2017-11-09 DIAGNOSIS — M5416 Radiculopathy, lumbar region: Secondary | ICD-10-CM | POA: Diagnosis not present

## 2017-11-09 DIAGNOSIS — M2550 Pain in unspecified joint: Secondary | ICD-10-CM

## 2017-11-09 DIAGNOSIS — M19041 Primary osteoarthritis, right hand: Secondary | ICD-10-CM | POA: Diagnosis not present

## 2017-11-09 DIAGNOSIS — M1990 Unspecified osteoarthritis, unspecified site: Secondary | ICD-10-CM

## 2017-11-09 MED ORDER — ACETAMINOPHEN 500 MG PO TABS: 500.0000 mg | ORAL_TABLET | ORAL | 3 refills | Status: DC | PRN

## 2017-11-09 MED ORDER — TRAMADOL HCL 50 MG PO TABS: 50.0000 mg | ORAL_TABLET | Freq: Three times a day (TID) | ORAL | 0 refills | Status: DC | PRN

## 2017-11-09 MED ORDER — MELOXICAM 7.5 MG PO TABS: 7.5000 mg | ORAL_TABLET | Freq: Every day | ORAL | 2 refills | Status: DC

## 2017-11-09 MED ORDER — PREDNISONE 10 MG PO TABS: ORAL_TABLET | ORAL | 0 refills | Status: DC

## 2017-11-09 NOTE — Telephone Encounter (Signed)
Pt seen today   [place]

## 2017-11-09 NOTE — Patient Instructions (Addendum)
Take Tramadol 1 pill at night as needed  We referred to Dr. Trixie Dredge orthopedics in [place]  Take prednisone with food   Lumbosacral Radiculopathy Lumbosacral radiculopathy is a condition that involves the spinal nerves and nerve roots in the low back and bottom of the spine. The condition develops when these nerves and nerve roots move out of place or become inflamed and cause symptoms. What are the causes? This condition may be caused by:  Pressure from a disk that bulges out of place (herniated disk). A disk is a plate of cartilage that separates bones in the spine.  Disk degeneration.  A narrowing of the bones of the lower back (spinal stenosis).  A tumor.  An infection.  An injury that places sudden pressure on the disks that cushion the bones of your lower spine.  What increases the risk? This condition is more likely to develop in:  Males aged 30-50 years.  Females aged 21-60 years.  People who lift improperly.  People who are overweight or live a sedentary lifestyle.  People who smoke.  People who perform repetitive activities that strain the spine.  What are the signs or symptoms? Symptoms of this condition include:  Pain that goes down from the back into the legs (sciatica). This is the most common symptom. The pain may be worse with sitting, coughing, or sneezing.  Pain and numbness in the arms and legs.  Muscle weakness.  Tingling.  Loss of bladder control or bowel control.  How is this diagnosed? This condition is diagnosed with a physical exam and medical history. If the pain is lasting, you may have tests, such as:  MRI scan.  X-ray.  CT scan.  Myelogram.  Nerve conduction study.  How is this treated? This condition is often treated with:  Hot packs and ice applied to affected areas.  Stretches to improve flexibility.  Exercises to strengthen back muscles.  Physical therapy.  Pain medicine.  A steroid injection  in the spine.  In some cases, no treatment is needed. If the condition is long-lasting (chronic), or if symptoms are severe, treatment may involve surgery or lifestyle changes, such as following a weight loss plan. Follow these instructions at home: Medicines  Take medicines only as directed by your health care provider.  Do not drive or operate heavy machinery while taking pain medicine. Injury care  Apply a heat pack to the injured area as directed by your health care provider.  Apply ice to the affected area: ? Put ice in a plastic bag. ? Place a towel between your skin and the bag. ? Leave the ice on for 20-30 minutes, every 2 hours while you are awake or as needed. Or, leave the ice on for as long as directed by your health care provider. Other Instructions  If you were shown how to do any exercises or stretches, do them as directed by your health care provider.  If your health care provider prescribed a diet or exercise program, follow it as directed.  Keep all follow-up visits as directed by your health care provider. This is important. Contact a health care provider if:  Your pain does not improve over time even when taking pain medicines. Get help right away if:  Your develop severe pain.  Your pain suddenly gets worse.  You develop increasing weakness in your legs.  You lose the ability to control your bladder or bowel.  You have difficulty walking or balancing.  You have a fever.  This information is not intended to replace advice given to you by your health care provider. Make sure you discuss any questions you have with your health care provider. Document Released: 03/29/2005 Document Revised: 09/04/2015 Document Reviewed: 03/25/2014 Elsevier Interactive Patient Education  2018 [employer].  Spinal Stenosis Spinal stenosis occurs when the open space (spinal canal) between the bones of your spine (vertebrae) narrows, putting pressure on the spinal cord or  nerves. What are the causes? This condition is caused by areas of bone pushing into the central canals of your vertebrae. This condition may be present at birth (congenital), or it may be caused by:  Arthritic deterioration of your vertebrae (spinal degeneration). This usually starts around age 66.  Injury or trauma to the spine.  Tumors in the spine.  Calcium deposits in the spine.  What are the signs or symptoms? Symptoms of this condition include:  Pain in the neck or back that is generally worse with activities, particularly when standing and walking.  Numbness, tingling, hot or cold sensations, weakness, or weariness in your legs.  Pain going up and down the leg (sciatica).  Frequent episodes of falling.  A foot-slapping gait that leads to muscle weakness.  In more serious cases, you may develop:  Problemspassing stool or passing urine.  Difficulty having sex.  Loss of feeling in part or all of your leg.  Symptoms may come on slowly and get worse over time. How is this diagnosed? This condition is diagnosed based on your medical history and a physical exam. Tests will also be done, such as:  MRI.  CT scan.  X-ray.  How is this treated? Treatment for this condition often focuses on managing your pain and any other symptoms. Treatment may include:  Practicing good posture to lessen pressure on your nerves.  Exercising to strengthen muscles, build endurance, improve balance, and maintain good joint movement (range of motion).  Losing weight, if needed.  Taking medicines to reduce swelling, inflammation, or pain.  Assistive devices, such as a corset or brace.  In some cases, surgery may be needed. The most common procedure is decompression laminectomy. This is done to remove excess bone that puts pressure on your nerve roots. Follow these instructions at home: Managing pain, stiffness, and swelling  Do all exercises and stretches as told by your health  care provider.  Practice good posture. If you were given a brace or a corset, wear it as told by your health care provider.  Do not do any activities that cause pain. Ask your health care provider what activities are safe for you.  Do not lift anything that is heavier than 10 lb (4.5 kg) or the limit that your health care provider tells you.  Maintain a healthy weight. Talk with your health care provider if you need help losing weight.  If directed, apply heat to the affected area as often as told by your health care provider. Use the heat source that your health care provider recommends, such as a moist heat pack or a heating pad. ? Place a towel between your skin and the heat source. ? Leave the heat on for 20-30 minutes. ? Remove the heat if your skin turns bright red. This is especially important if you are not able to feel pain, heat, or cold. You may have a greater risk of getting burned. General instructions  Take over-the-counter and prescription medicines only as told by your health care provider.  Do not use any products that contain  nicotine or tobacco, such as cigarettes and e-cigarettes. If you need help quitting, ask your health care provider.  Eat a healthy diet. This includes plenty of fruits and vegetables, whole grains, and low-fat (lean) protein.  Keep all follow-up visits as told by your health care provider. This is important. Contact a health care provider if:  Your symptoms do not get better or they get worse.  You have a fever. Get help right away if:  You have new or worse pain in your neck or upper back.  You have severe pain that cannot be controlled with medicines.  You are dizzy.  You have vision problems, blurred vision, or double vision.  You have a severe headache that is worse when you stand.  You have nausea or you vomit.  You develop new or worse numbness or tingling in your back or legs.  You have pain, redness, swelling, or warmth in  your arm or leg. Summary  Spinal stenosis occurs when the open space (spinal canal) between the bones of your spine (vertebrae) narrows. This narrowing puts pressure on the spinal cord or nerves.  Spinal stenosis can cause numbness, weakness, or pain in the neck, back, and legs.  This condition may be caused by a birth defect, arthritic deterioration of your vertebrae, injury, tumors, or calcium deposits.  This condition is usually diagnosed with MRIs, CT scans, and X-rays. This information is not intended to replace advice given to you by your health care provider. Make sure you discuss any questions you have with your health care provider. Document Released: 06/19/2003 Document Revised: 03/03/2016 Document Reviewed: 03/03/2016 Elsevier Interactive Patient Education  [employer].

## 2017-11-09 NOTE — Progress Notes (Signed)
Chief Complaint  Patient presents with  . Hand Pain   F/u  1. C/o thumb pain R>L now left starting 9/10 worse with pressure from wife body on thumb when lifting tried mobic 1-2 pills prn w/o help.  2. C/o low back pain 7/10 moderate with standing feels better and worse with sitting. Reviewed CT lumbar below . Pain at times radiates down left leg. Back pain worse with 2 weeks he has to help lift wife in bed which makes worse.  IMPRESSION: 1. Spinal stenosis is most significant at L4-5 with moderate central canal stenosis and mild foraminal narrowing bilaterally, left greater than right. 2. Asymmetric left-sided facet hypertrophy and spurring at L5-S1 without significant stenosis. 3. Mild broad-based disc protrusion and bilateral facet hypertrophy at L3-4 without significant stenosis. 4. Sigmoid diverticulosis without diverticulitis. 5. Mild scoliosis as described. 6.  Aortic Atherosclerosis (ICD10-I70.0).  Review of Systems  Constitutional: Negative for weight loss.  HENT: Negative for hearing loss.   Eyes: Negative for blurred vision.  Respiratory: Negative for shortness of breath.   Cardiovascular: Negative for chest pain.  Musculoskeletal: Positive for back pain and joint pain. Negative for falls.  Skin: Negative for rash.  Psychiatric/Behavioral: Negative for depression.   Past Medical History:  Diagnosis Date  . Amputation of finger, left    5th finger   . Arthritis    hands, back lumbar sacral   . Cancer ([place]) 2006   Prostate cancer with resection radical prostate removal. Urologist Dr. Hollice Espy locally; prostate ca dx'ed in 2007 and 2015   . Carotid artery stenosis    07/26/16 US carotid b/l 1-39%   . Cataract    s/p surgery b/l Dr. Oralia Manis [facility] 2018  . Depression   . Diverticulosis   . Erectile dysfunction   . Gastric ulcer   . GERD (gastroesophageal reflux disease)   . Hepatic steatosis   . History of splenectomy   . Hyperlipidemia   . Spinal  stenosis at L4-L5 level   . Thoracic aortic atherosclerosis ([place])   . Urinary stress incontinence, male    Past Surgical History:  Procedure Laterality Date  . INCONTINENCE SURGERY    . KNEE ARTHROSCOPY N/A 1969   meniscal tear. left   . PROSTATECTOMY  2007   removal of prostate  . REPAIR OF PERFORATED ULCER     2009  . SPLENECTOMY  1953   after trauma, fell out tree as kid  . VENTRAL HERNIA REPAIR     Family History  Problem Relation Age of Onset  . Dementia Mother   . Glaucoma Mother   . Alcohol abuse Father   . Glaucoma Sister   . Heart disease Brother        heart valve issue  . Multiple sclerosis Brother   . Kidney disease Neg Hx   . Prostate cancer Neg Hx    Social History   Socioeconomic History  . Marital status: Married    Spouse name: Not on file  . Number of children: Not on file  . Years of education: Not on file  . Highest education level: Not on file  Occupational History  . Not on file  Social Needs  . Financial resource strain: Not on file  . Food insecurity:    Worry: Not on file    Inability: Not on file  . Transportation needs:    Medical: Not on file    Non-medical: Not on file  Tobacco Use  . Smoking  status: Former Smoker    Last attempt to quit: 06/05/1978    Years since quitting: 39.4  . Smokeless tobacco: Never Used  . Tobacco comment: quit in 1980s duration 6-7 years 1 ppd no FH lung cancer   Substance and Sexual Activity  . Alcohol use: Yes    Alcohol/week: 0.0 oz    Comment: OCC  . Drug use: No  . Sexual activity: Not Currently  Lifestyle  . Physical activity:    Days per week: Not on file    Minutes per session: Not on file  . Stress: Not on file  Relationships  . Social connections:    Talks on phone: Not on file    Gets together: Not on file    Attends religious service: Not on file    Active member of club or organization: Not on file    Attends meetings of clubs or organizations: Not on file    Relationship status:  Not on file  . Intimate partner violence:    Fear of current or ex partner: Not on file    Emotionally abused: Not on file    Physically abused: Not on file    Forced sexual activity: Not on file  Other Topics Concern  . Not on file  Social History Narrative   Born in [place].   Lived in [place].    Moved to [facility], brother lives in [place] another [place]       Has 2 brothers and 1 sister.      Lives with wife in [place]. Has 3 daughters.      Work - retired, Surveyor, quantity      Diet - regular      Exercise - walks occasionally   Current Meds  Medication Sig  . betamethasone dipropionate (DIPROLENE) 0.05 % cream Apply topically as needed. Reported on 06/03/2015  . calcium carbonate (OS-CAL) 600 MG TABS tablet Take 600 mg by mouth 2 (two) times daily with a meal.  . DULoxetine (CYMBALTA) 60 MG capsule Take 1 capsule (60 mg total) by mouth daily.  . ferrous sulfate 325 (65 FE) MG EC tablet Take 325 mg by mouth 3 (three) times daily with meals.  . IRON PO Take by mouth.  . meloxicam (MOBIC) 7.5 MG tablet Take 1-2 tablets (7.5-15 mg total) by mouth daily.  . Multiple Vitamin (MULTIVITAMIN) capsule Take 1 capsule by mouth daily.  . valACYclovir (VALTREX) 1000 MG tablet Take 1 tablet (1,000 mg total) by mouth 2 (two) times daily as needed.   No Known Allergies Recent Results (from the past 2160 hour(s))  Sedimentation rate     Status: Abnormal   Collection Time: 08/16/17  3:21 PM  Result Value Ref Range   Sed Rate 21 (H) 0 - 20 mm/hr  C-reactive protein     Status: Abnormal   Collection Time: 08/16/17  3:21 PM  Result Value Ref Range   CRP 0.3 (L) 0.5 - 45.8 mg/dL  Cyclic citrul peptide antibody, IgG     Status: None   Collection Time: 08/16/17  3:21 PM  Result Value Ref Range   Cyclic Citrullin Peptide Ab <16 UNITS    Comment: Reference Range Negative:            <20 Weak Positive:       20-39 Moderate Positive:   40-59 Strong Positive:     >59 .   Rheumatoid Factor      Status: None   Collection Time: 08/16/17  3:21  PM  Result Value Ref Range   Rhuematoid fact SerPl-aCnc <14 <14 IU/mL  Vitamin D (25 hydroxy)     Status: None   Collection Time: 08/16/17  3:32 PM  Result Value Ref Range   VITD 50.04 30.00 - 100.00 ng/mL  PSA     Status: None   Collection Time: 09/21/17  3:35 PM  Result Value Ref Range   Prostate Specific Ag, Serum <0.1 0.0 - 4.0 ng/mL    Comment: Roche ECLIA methodology. According to the American Urological Association, Serum PSA should decrease and remain at undetectable levels after radical prostatectomy. The AUA defines biochemical recurrence as an initial PSA value 0.2 ng/mL or greater followed by a subsequent confirmatory PSA value 0.2 ng/mL or greater. Values obtained with different assay methods or kits cannot be used interchangeably. Results cannot be interpreted as absolute evidence of the presence or absence of malignant disease.    Objective  Body mass index is 28.95 kg/m. Wt Readings from Last 3 Encounters:  11/09/17 190 lb 6.4 oz (86.4 kg)  08/16/17 186 lb 3.2 oz (84.5 kg)  03/30/17 189 lb 8 oz (86 kg)   Temp Readings from Last 3 Encounters:  11/09/17 98 F (36.7 C) (Oral)  08/16/17 97.8 F (36.6 C) (Oral)  03/30/17 97.8 F (36.6 C) (Oral)   BP Readings from Last 3 Encounters:  11/09/17 118/82  08/16/17 120/76  03/30/17 130/78   Pulse Readings from Last 3 Encounters:  11/09/17 79  08/16/17 77  03/30/17 63    Physical Exam  Constitutional: He is oriented to person, place, and time. Vital signs are normal. He appears well-developed and well-nourished. He is cooperative.  HENT:  Head: Normocephalic and atraumatic.  Mouth/Throat: Oropharynx is clear and moist and mucous membranes are normal.  Eyes: Pupils are equal, round, and reactive to light. Conjunctivae are normal.  Cardiovascular: Normal rate, regular rhythm and normal heart sounds.  Pulmonary/Chest: Effort normal and breath sounds normal.   Musculoskeletal:       Lumbar back: He exhibits tenderness.  +left Str8 leg test +   Neurological: He is alert and oriented to person, place, and time. Gait normal.  Skin: Skin is warm, dry and intact.  Psychiatric: He has a normal mood and affect. His speech is normal and behavior is normal. Judgment and thought content normal. Cognition and memory are normal.  Nursing note and vitals reviewed.   Assessment   1. B/l thumb pain R>L and OA deformities vs tedonitis 2. Spinal stenosis and herniated disc lumbar CT 04/2017  3. H/o prostate cancer  4.HM 5. anemia Plan   1.  Mobic, prn tramadol, prn Tylenol  Thumb spica splint  Xray right hand today  Consider ortho hand  Labs CMET, CBC 2. Refer to Dr. Lynann Bologna in [place] to disc options given copy of CT lumbar  3. F/u urology  4.  Had flu shot 01/26/17 Tdap 2015 Had prevnar and pna 23  shingrix Rx prev will ask if he had it   Need to get colonoscopy record just have nl per report in chart given release of records pt signed again today to get from [facility] urology. Rad onc appts h/o prostate cancer  dermatology established with [facility].   Hep C neg 04/08/17   Former smoker quit in 1980s smoked x 6-7 years no FH lung cancer smoked 1 ppd   5.  Check CBC and anemia panel today   Provider: Dr. Olivia Mackie McLean-Scocuzza-Internal Medicine

## 2017-11-09 NOTE — Telephone Encounter (Signed)
Copied from [place] [id number]. Topic: Appointment Scheduling - Scheduling Inquiry for Clinic >> Nov 08, 2017  8:18 AM Synthia Innocent wrote: Reason for CRM: Patient having right thumb pain, requesting to be worked in with Dr Terese Door, requesting sometime next week. She has seen him for this prior. Please advise  >> Nov 09, 2017  8:53 AM Celedonio Savage L wrote: Pt made an appt for today about his thumb he never received a call back

## 2017-11-10 ENCOUNTER — Encounter: Payer: Self-pay | Admitting: *Deleted

## 2017-11-10 ENCOUNTER — Ambulatory Visit (INDEPENDENT_AMBULATORY_CARE_PROVIDER_SITE_OTHER): Payer: PPO

## 2017-11-10 DIAGNOSIS — M1990 Unspecified osteoarthritis, unspecified site: Secondary | ICD-10-CM | POA: Diagnosis not present

## 2017-11-10 DIAGNOSIS — M2550 Pain in unspecified joint: Secondary | ICD-10-CM

## 2017-11-10 DIAGNOSIS — S60455A Superficial foreign body of left ring finger, initial encounter: Secondary | ICD-10-CM | POA: Diagnosis not present

## 2017-11-10 LAB — IRON,TIBC AND FERRITIN PANEL
%SAT: 27 % (calc) (ref 20–48)
Ferritin: 96 ng/mL (ref 24–380)
Iron: 89 ug/dL (ref 50–180)
TIBC: 328 ug/dL (ref 250–425)

## 2017-11-11 LAB — COMPREHENSIVE METABOLIC PANEL
AG RATIO: 2 (calc) (ref 1.0–2.5)
ALBUMIN MSPROF: 4.7 g/dL (ref 3.6–5.1)
ALKALINE PHOSPHATASE (APISO): 83 U/L (ref 40–115)
ALT: 16 U/L (ref 9–46)
AST: 19 U/L (ref 10–35)
BILIRUBIN TOTAL: 0.6 mg/dL (ref 0.2–1.2)
BUN: 23 mg/dL (ref 7–25)
CO2: 17 mmol/L — AB (ref 20–32)
Calcium: 10.2 mg/dL (ref 8.6–10.3)
Chloride: 106 mmol/L (ref 98–110)
Creat: 0.7 mg/dL (ref 0.70–1.18)
Globulin: 2.3 g/dL (calc) (ref 1.9–3.7)
Glucose, Bld: 120 mg/dL — ABNORMAL HIGH (ref 65–99)
POTASSIUM: 4.5 mmol/L (ref 3.5–5.3)
SODIUM: 140 mmol/L (ref 135–146)
TOTAL PROTEIN: 7 g/dL (ref 6.1–8.1)

## 2017-11-14 ENCOUNTER — Telehealth: Payer: Self-pay | Admitting: Internal Medicine

## 2017-11-14 ENCOUNTER — Other Ambulatory Visit: Payer: Self-pay | Admitting: Internal Medicine

## 2017-11-14 DIAGNOSIS — D649 Anemia, unspecified: Secondary | ICD-10-CM

## 2017-11-14 DIAGNOSIS — R739 Hyperglycemia, unspecified: Secondary | ICD-10-CM

## 2017-11-14 NOTE — Telephone Encounter (Signed)
Please have pt come back in for CBC and A1C  Call to sch lab appt   [place]

## 2017-11-15 ENCOUNTER — Telehealth: Payer: Self-pay

## 2017-11-15 NOTE — Telephone Encounter (Signed)
Called patient and left VM stating he needs to make a lab appointment since lab did not draw all the labs. He will need a A1C and CBC. PEC may inform patient and make appointment

## 2017-11-15 NOTE — Telephone Encounter (Signed)
-----   Message from Delorise Jackson, MD sent at 11/14/2017  5:56 PM EDT ----- Please have pt come back in for CBC and A1C  No charge he was supposed to have CBC 1st lab draw   Thanks [place] ----- Message ----- From: Arby Barrette, RT Sent: 11/11/2017   9:03 AM To: Nino Glow McLean-Scocuzza, MD  I did not see message until after 5. Pt will need to come back in for CBC draw.   ----- Message ----- From: McLean-Scocuzza, Nino Glow, MD Sent: 11/11/2017   7:53 AM To: Arby Barrette, RT  Did you collect CBC? As I sent message about with Xrays ?  [place]

## 2017-11-19 ENCOUNTER — Encounter: Payer: Self-pay | Admitting: Internal Medicine

## 2017-11-21 ENCOUNTER — Other Ambulatory Visit (INDEPENDENT_AMBULATORY_CARE_PROVIDER_SITE_OTHER): Payer: PPO

## 2017-11-21 DIAGNOSIS — D649 Anemia, unspecified: Secondary | ICD-10-CM | POA: Diagnosis not present

## 2017-11-21 DIAGNOSIS — R739 Hyperglycemia, unspecified: Secondary | ICD-10-CM

## 2017-11-21 DIAGNOSIS — C61 Malignant neoplasm of prostate: Secondary | ICD-10-CM

## 2017-11-22 LAB — CBC WITH DIFFERENTIAL/PLATELET
Basophils Absolute: 0.1 10*3/uL (ref 0.0–0.1)
Basophils Relative: 1 % (ref 0.0–3.0)
EOS ABS: 0.3 10*3/uL (ref 0.0–0.7)
Eosinophils Relative: 3.7 % (ref 0.0–5.0)
HCT: 39.4 % (ref 39.0–52.0)
HEMOGLOBIN: 13 g/dL (ref 13.0–17.0)
Lymphocytes Relative: 20.1 % (ref 12.0–46.0)
Lymphs Abs: 1.9 10*3/uL (ref 0.7–4.0)
MCHC: 33.1 g/dL (ref 30.0–36.0)
MCV: 94 fl (ref 78.0–100.0)
MONO ABS: 0.7 10*3/uL (ref 0.1–1.0)
Monocytes Relative: 7.9 % (ref 3.0–12.0)
Neutro Abs: 6.3 10*3/uL (ref 1.4–7.7)
Neutrophils Relative %: 67.3 % (ref 43.0–77.0)
Platelets: 217 10*3/uL (ref 150.0–400.0)
RBC: 4.19 Mil/uL — AB (ref 4.22–5.81)
RDW: 13.7 % (ref 11.5–15.5)
WBC: 9.4 10*3/uL (ref 4.0–10.5)

## 2017-11-22 LAB — HEMOGLOBIN A1C: HEMOGLOBIN A1C: 6.1 % (ref 4.6–6.5)

## 2017-11-25 DIAGNOSIS — M545 Low back pain: Secondary | ICD-10-CM | POA: Diagnosis not present

## 2017-12-16 ENCOUNTER — Other Ambulatory Visit: Payer: Self-pay

## 2017-12-16 ENCOUNTER — Emergency Department
Admission: EM | Admit: 2017-12-16 | Discharge: 2017-12-16 | Disposition: A | Discharge status: Home / Self Care | Payer: PPO | Attending: Emergency Medicine | Admitting: Emergency Medicine

## 2017-12-16 ENCOUNTER — Emergency Department: Payer: PPO

## 2017-12-16 DIAGNOSIS — R109 Unspecified abdominal pain: Secondary | ICD-10-CM | POA: Diagnosis not present

## 2017-12-16 DIAGNOSIS — R1084 Generalized abdominal pain: Secondary | ICD-10-CM | POA: Diagnosis not present

## 2017-12-16 DIAGNOSIS — N200 Calculus of kidney: Secondary | ICD-10-CM | POA: Diagnosis not present

## 2017-12-16 DIAGNOSIS — F329 Major depressive disorder, single episode, unspecified: Secondary | ICD-10-CM | POA: Diagnosis not present

## 2017-12-16 DIAGNOSIS — Z87891 Personal history of nicotine dependence: Secondary | ICD-10-CM | POA: Insufficient documentation

## 2017-12-16 DIAGNOSIS — I10 Essential (primary) hypertension: Secondary | ICD-10-CM | POA: Diagnosis not present

## 2017-12-16 DIAGNOSIS — Z79899 Other long term (current) drug therapy: Secondary | ICD-10-CM | POA: Diagnosis not present

## 2017-12-16 DIAGNOSIS — Z8546 Personal history of malignant neoplasm of prostate: Secondary | ICD-10-CM | POA: Diagnosis not present

## 2017-12-16 DIAGNOSIS — R112 Nausea with vomiting, unspecified: Secondary | ICD-10-CM | POA: Diagnosis not present

## 2017-12-16 DIAGNOSIS — R52 Pain, unspecified: Secondary | ICD-10-CM | POA: Diagnosis not present

## 2017-12-16 DIAGNOSIS — R1031 Right lower quadrant pain: Secondary | ICD-10-CM | POA: Diagnosis present

## 2017-12-16 DIAGNOSIS — R42 Dizziness and giddiness: Secondary | ICD-10-CM | POA: Diagnosis not present

## 2017-12-16 LAB — CBC WITH DIFFERENTIAL/PLATELET
BASOS ABS: 0.1 10*3/uL (ref 0–0.1)
BASOS PCT: 1 %
Eosinophils Absolute: 0.2 10*3/uL (ref 0–0.7)
Eosinophils Relative: 2 %
HCT: 39.8 % — ABNORMAL LOW (ref 40.0–52.0)
HEMOGLOBIN: 13.3 g/dL (ref 13.0–18.0)
Lymphocytes Relative: 13 %
Lymphs Abs: 1.3 10*3/uL (ref 1.0–3.6)
MCH: 31 pg (ref 26.0–34.0)
MCHC: 33.4 g/dL (ref 32.0–36.0)
MCV: 93 fL (ref 80.0–100.0)
Monocytes Absolute: 0.7 10*3/uL (ref 0.2–1.0)
Monocytes Relative: 7 %
NEUTROS ABS: 7.9 10*3/uL — AB (ref 1.4–6.5)
NEUTROS PCT: 77 %
Platelets: 256 10*3/uL (ref 150–440)
RBC: 4.28 MIL/uL — AB (ref 4.40–5.90)
RDW: 13.5 % (ref 11.5–14.5)
WBC: 10.2 10*3/uL (ref 3.8–10.6)

## 2017-12-16 LAB — URINALYSIS, COMPLETE (UACMP) WITH MICROSCOPIC
BACTERIA UA: NONE SEEN
Bilirubin Urine: NEGATIVE
Glucose, UA: NEGATIVE mg/dL
Ketones, ur: 20 mg/dL — AB
Leukocytes, UA: NEGATIVE
Nitrite: NEGATIVE
PH: 7 (ref 5.0–8.0)
Protein, ur: NEGATIVE mg/dL
SPECIFIC GRAVITY, URINE: 1.018 (ref 1.005–1.030)
SQUAMOUS EPITHELIAL / LPF: NONE SEEN (ref 0–5)

## 2017-12-16 LAB — COMPREHENSIVE METABOLIC PANEL
ALBUMIN: 4.5 g/dL (ref 3.5–5.0)
ALK PHOS: 76 U/L (ref 38–126)
ALT: 20 U/L (ref 0–44)
AST: 26 U/L (ref 15–41)
Anion gap: 6 (ref 5–15)
BILIRUBIN TOTAL: 0.9 mg/dL (ref 0.3–1.2)
BUN: 25 mg/dL — AB (ref 8–23)
CALCIUM: 10.2 mg/dL (ref 8.9–10.3)
CO2: 30 mmol/L (ref 22–32)
Chloride: 106 mmol/L (ref 98–111)
Creatinine, Ser: 0.84 mg/dL (ref 0.61–1.24)
GFR calc Af Amer: >60 mL/min (ref >60)
GFR calc non Af Amer: >60 mL/min (ref >60)
GLUCOSE: 101 mg/dL — AB (ref 70–99)
Potassium: 5 mmol/L (ref 3.5–5.1)
Sodium: 142 mmol/L (ref 135–145)
TOTAL PROTEIN: 7.3 g/dL (ref 6.5–8.1)

## 2017-12-16 MED ORDER — TAMSULOSIN HCL 0.4 MG PO CAPS: 0.4000 mg | ORAL_CAPSULE | Freq: Every day | ORAL | 0 refills | Status: DC

## 2017-12-16 MED ORDER — OXYCODONE-ACETAMINOPHEN 5-325 MG PO TABS: 1.0000 | ORAL_TABLET | ORAL | 0 refills | Status: DC | PRN

## 2017-12-16 MED ORDER — SODIUM CHLORIDE 0.9 % IV BOLUS
1000.0000 mL | Freq: Once | INTRAVENOUS | Status: AC
  Administered 2017-12-16: 1000 mL via INTRAVENOUS

## 2017-12-16 NOTE — ED Provider Notes (Signed)
[facility] Emergency Department Provider Note ____________________________________________   First MD Initiated Contact with Patient 12/16/17 1817     (approximate)  I have reviewed the triage vital signs and the nursing notes.   HISTORY  Chief Complaint Abdominal Pain  HPI Benjamin Mills is a 72 y.o. male with a history of diverticulitis as well as erectile dysfunction is presenting to the emergency department with right flank pain that started approximately 415 this afternoon.  He said it was sharp and started in his back and radiated around to his groin.  He says the pain was severe and that he vomited once.  However, the pain is a 3 out of 10 at this time.  He says that it is coming and going but tolerable right now.  Denies any nausea at this time.  Also says that he feels like he has to urinate but has not urinated.  No history of kidney stones.  Past Medical History:  Diagnosis Date  . Amputation of finger, left    5th finger   . Arthritis    hands, back lumbar sacral   . Cancer ([place]) 2006   Prostate cancer with resection radical prostate removal. Urologist Dr. Hollice Espy locally; prostate ca dx'ed in 2007 and 2015   . Carotid artery stenosis    07/26/16 US carotid b/l 1-39%   . Cataract    s/p surgery b/l Dr. Oralia Manis [facility] 2018  . Depression   . Diverticulosis   . Erectile dysfunction   . Gastric ulcer   . GERD (gastroesophageal reflux disease)   . Hepatic steatosis   . History of splenectomy   . Hyperlipidemia   . Spinal stenosis   . Spinal stenosis at L4-L5 level   . Thoracic aortic atherosclerosis ([place])   . Urinary stress incontinence, male     Patient Active Problem List   Diagnosis Date Noted  . Lumbar radiculopathy 11/09/2017  . Spinal stenosis at L4-L5 level 08/16/2017  . Bunion of great toe of left foot 08/16/2017  . Seborrheic keratoses 08/16/2017  . Multiple benign nevi 08/16/2017  . Spondylosis of lumbosacral  region 03/30/2017  . Arthritis 03/30/2017  . Benign skin lesion 11/05/2015  . SUI (stress urinary incontinence), male 12/10/2014  . Erectile dysfunction following radical prostatectomy 12/10/2014  . Adjustment disorder with mixed anxiety and depressed mood 08/06/2014  . Hyperlipidemia 06/05/2014  . History of prostate cancer 06/05/2014  . History of splenectomy 06/05/2014  . H/O cold sores 06/05/2014    Past Surgical History:  Procedure Laterality Date  . INCONTINENCE SURGERY    . KNEE ARTHROSCOPY N/A 1969   meniscal tear. left   . PROSTATECTOMY  2007   removal of prostate  . REPAIR OF PERFORATED ULCER     2009  . SPLENECTOMY  1953   after trauma, fell out tree as kid  . VENTRAL HERNIA REPAIR      Prior to Admission medications   Medication Sig Start Date End Date Taking? Authorizing Provider  acetaminophen (TYLENOL) 500 MG tablet Take 1 tablet (500 mg total) by mouth every 4 (four) hours as needed. 11/09/17   McLean-Scocuzza, Nino Glow, MD  betamethasone dipropionate (DIPROLENE) 0.05 % cream Apply topically as needed. Reported on 06/03/2015    [provider]  calcium carbonate (OS-CAL) 600 MG TABS tablet Take 600 mg by mouth 2 (two) times daily with a meal.    [provider]  DULoxetine (CYMBALTA) 60 MG capsule Take 1  capsule (60 mg total) by mouth daily. 11/08/16   Coral Spikes, DO  ferrous sulfate 325 (65 FE) MG EC tablet Take 325 mg by mouth 3 (three) times daily with meals.    [provider]  IRON PO Take by mouth.    [provider]  meloxicam (MOBIC) 7.5 MG tablet Take 1-2 tablets (7.5-15 mg total) by mouth daily. 11/09/17   McLean-Scocuzza, Nino Glow, MD  Multiple Vitamin (MULTIVITAMIN) capsule Take 1 capsule by mouth daily.    [provider]  predniSONE (DELTASONE) 10 MG tablet Take 60mg  by mouth on day 1, then taper by 10mg  daily until gone 11/09/17   McLean-Scocuzza, Nino Glow, MD  sildenafil (REVATIO) 20 MG tablet Take 20 mg by  mouth 3 (three) times daily as needed. Reported on 06/03/2015    [provider]  traMADol (ULTRAM) 50 MG tablet Take 1 tablet (50 mg total) by mouth every 8 (eight) hours as needed. 11/09/17   McLean-Scocuzza, Nino Glow, MD  valACYclovir (VALTREX) 1000 MG tablet Take 1 tablet (1,000 mg total) by mouth 2 (two) times daily as needed. 08/17/16   Coral Spikes, DO    Allergies Patient has no known allergies.  Family History  Problem Relation Age of Onset  . Dementia Mother   . Glaucoma Mother   . Alcohol abuse Father   . Glaucoma Sister   . Heart disease Brother        heart valve issue  . Multiple sclerosis Brother   . Kidney disease Neg Hx   . Prostate cancer Neg Hx     Social History Social History   Tobacco Use  . Smoking status: Former Smoker    Last attempt to quit: 06/05/1978    Years since quitting: 39.5  . Smokeless tobacco: Never Used  . Tobacco comment: quit in 1980s duration 6-7 years 1 ppd no FH lung cancer   Substance Use Topics  . Alcohol use: Yes    Alcohol/week: 0.0 standard drinks    Comment: OCC  . Drug use: No    Review of Systems  Constitutional: No fever/chills Eyes: No visual changes. ENT: No sore throat. Cardiovascular: Denies chest pain. Respiratory: Denies shortness of breath. Gastrointestinal: No diarrhea.  No constipation. Genitourinary: Negative for dysuria. Musculoskeletal: As above Skin: Negative for rash. Neurological: Negative for headaches, focal weakness or numbness.   ____________________________________________   PHYSICAL EXAM:  VITAL SIGNS: ED Triage Vitals  Enc Vitals Group     BP 12/16/17 1901 (!) 150/70     Pulse Rate 12/16/17 1852 66     Resp 12/16/17 1901 16     Temp 12/16/17 1834 97.8 F (36.6 C)     Temp Source 12/16/17 1901 Oral     SpO2 12/16/17 1834 99 %     Weight 12/16/17 1835 190 lb 7.6 oz (86.4 kg)     Height 12/16/17 1835 5\' 8"  (1.727 m)     Head Circumference --      Peak Flow --      Pain Score  12/16/17 1834 0     Pain Loc --      Pain Edu? --      Excl. in [place]? --     Constitutional: Alert and oriented. Well appearing and in no acute distress. Eyes: Conjunctivae are normal.  Head: Atraumatic. Nose: No congestion/rhinnorhea. Mouth/Throat: Mucous membranes are moist.  Neck: No stridor.   Cardiovascular: Normal rate, regular rhythm. Grossly normal heart sounds.   Respiratory:  Normal respiratory effort.  No retractions. Lungs CTAB. Gastrointestinal: Soft and nontender. No distention. No CVA tenderness. Musculoskeletal: No lower extremity tenderness nor edema.  No joint effusions. Neurologic:  Normal speech and language. No gross focal neurologic deficits are appreciated. Skin:  Skin is warm, dry and intact. No rash noted. Psychiatric: Mood and affect are normal. Speech and behavior are normal.  ____________________________________________   LABS (all labs ordered are listed, but only abnormal results are displayed)  Labs Reviewed  CBC WITH DIFFERENTIAL/PLATELET - Abnormal; Notable for the following components:      Result Value   RBC 4.28 (*)    HCT 39.8 (*)    Neutro Abs 7.9 (*)    All other components within normal limits  COMPREHENSIVE METABOLIC PANEL - Abnormal; Notable for the following components:   Glucose, Bld 101 (*)    BUN 25 (*)    All other components within normal limits  URINALYSIS, COMPLETE (UACMP) WITH MICROSCOPIC   ____________________________________________  EKG   ____________________________________________  RADIOLOGY  4 x 2 mm kidney stone in the right UVJ.  Mild hydronephrosis ____________________________________________   PROCEDURES  Procedure(s) performed:   Procedures  Critical Care performed:   ____________________________________________   INITIAL IMPRESSION / ASSESSMENT AND PLAN / ED COURSE  Pertinent labs & imaging results that were available during my care of the patient were reviewed by me and considered in my  medical decision making (see chart for details).  Differential diagnosis includes, but is not limited to, acute appendicitis, renal colic, testicular torsion, urinary tract infection/pyelonephritis, prostatitis,  epididymitis, diverticulitis, small bowel obstruction or ileus, colitis, abdominal aortic aneurysm, gastroenteritis, hernia, etc. As part of my medical decision making, I reviewed the following data within the electronic MEDICAL RECORD NUMBER Notes from prior ED visits  ----------------------------------------- 7:57 PM on 12/16/2017 -----------------------------------------  Patient resting without any distress at this time.  Not complaining of any further pain.  Informed about his diagnosis of kidney stone.  He will be discharged with Percocet as well as Flomax.  Given return precautions.  He will be following up with orthopedics.  Understands to return for any worsening pain, nausea or vomiting or any other worsening or concerning symptoms. ____________________________________________   FINAL CLINICAL IMPRESSION(S) / ED DIAGNOSES  Kidney stone    NEW MEDICATIONS STARTED DURING THIS VISIT:  New Prescriptions   No medications on file     Note:  This document was prepared using Dragon voice recognition software and may include unintentional dictation errors.     Orbie Pyo, MD 12/16/17 2001

## 2017-12-16 NOTE — ED Notes (Addendum)
Pt unhooked from BP cuff and O2 monitoring to use restroom, pt able to walk to restroom at nursing station with no assistance.

## 2017-12-16 NOTE — ED Triage Notes (Signed)
Pt arrives via [facility] from home c/o R flank pain 10/10 that has subsided. Dysuria x 2 days.

## 2017-12-17 ENCOUNTER — Encounter: Payer: Self-pay | Admitting: Internal Medicine

## 2017-12-18 ENCOUNTER — Other Ambulatory Visit: Payer: Self-pay

## 2017-12-18 ENCOUNTER — Emergency Department
Admission: EM | Admit: 2017-12-18 | Discharge: 2017-12-18 | Disposition: A | Discharge status: Home / Self Care | Payer: PPO | Attending: Emergency Medicine | Admitting: Emergency Medicine

## 2017-12-18 ENCOUNTER — Encounter: Payer: Self-pay | Admitting: Emergency Medicine

## 2017-12-18 DIAGNOSIS — R1031 Right lower quadrant pain: Secondary | ICD-10-CM | POA: Diagnosis present

## 2017-12-18 DIAGNOSIS — Z8546 Personal history of malignant neoplasm of prostate: Secondary | ICD-10-CM | POA: Insufficient documentation

## 2017-12-18 DIAGNOSIS — N200 Calculus of kidney: Secondary | ICD-10-CM | POA: Insufficient documentation

## 2017-12-18 DIAGNOSIS — Z87891 Personal history of nicotine dependence: Secondary | ICD-10-CM | POA: Diagnosis not present

## 2017-12-18 DIAGNOSIS — Z79899 Other long term (current) drug therapy: Secondary | ICD-10-CM | POA: Insufficient documentation

## 2017-12-18 DIAGNOSIS — F329 Major depressive disorder, single episode, unspecified: Secondary | ICD-10-CM | POA: Diagnosis not present

## 2017-12-18 DIAGNOSIS — I2510 Atherosclerotic heart disease of native coronary artery without angina pectoris: Secondary | ICD-10-CM | POA: Insufficient documentation

## 2017-12-18 LAB — URINALYSIS, COMPLETE (UACMP) WITH MICROSCOPIC
Bacteria, UA: NONE SEEN
Bilirubin Urine: NEGATIVE
Glucose, UA: NEGATIVE mg/dL
HGB URINE DIPSTICK: NEGATIVE
KETONES UR: 5 mg/dL — AB
LEUKOCYTES UA: NEGATIVE
Nitrite: NEGATIVE
PROTEIN: NEGATIVE mg/dL
Specific Gravity, Urine: 1.024 (ref 1.005–1.030)
pH: 7 (ref 5.0–8.0)

## 2017-12-18 LAB — BASIC METABOLIC PANEL
ANION GAP: 5 (ref 5–15)
BUN: 17 mg/dL (ref 8–23)
CALCIUM: 9.1 mg/dL (ref 8.9–10.3)
CO2: 27 mmol/L (ref 22–32)
Chloride: 106 mmol/L (ref 98–111)
Creatinine, Ser: 0.92 mg/dL (ref 0.61–1.24)
GFR calc Af Amer: >60 mL/min (ref >60)
GLUCOSE: 140 mg/dL — AB (ref 70–99)
POTASSIUM: 4.3 mmol/L (ref 3.5–5.1)
Sodium: 138 mmol/L (ref 135–145)

## 2017-12-18 LAB — CBC WITH DIFFERENTIAL/PLATELET
BASOS ABS: 0 10*3/uL (ref 0–0.1)
Basophils Relative: 0 %
EOS PCT: 1 %
Eosinophils Absolute: 0.1 10*3/uL (ref 0–0.7)
HCT: 37.3 % — ABNORMAL LOW (ref 40.0–52.0)
Hemoglobin: 12.9 g/dL — ABNORMAL LOW (ref 13.0–18.0)
Lymphocytes Relative: 9 %
Lymphs Abs: 1.3 10*3/uL (ref 1.0–3.6)
MCH: 31.9 pg (ref 26.0–34.0)
MCHC: 34.7 g/dL (ref 32.0–36.0)
MCV: 92 fL (ref 80.0–100.0)
MONO ABS: 0.7 10*3/uL (ref 0.2–1.0)
Monocytes Relative: 6 %
Neutro Abs: 11.5 10*3/uL — ABNORMAL HIGH (ref 1.4–6.5)
Neutrophils Relative %: 84 %
Platelets: 232 10*3/uL (ref 150–440)
RBC: 4.05 MIL/uL — AB (ref 4.40–5.90)
RDW: 13.5 % (ref 11.5–14.5)
WBC: 13.6 10*3/uL — AB (ref 3.8–10.6)

## 2017-12-18 MED ORDER — ONDANSETRON HCL 4 MG/2ML IJ SOLN
4.0000 mg | Freq: Once | INTRAMUSCULAR | Status: AC
  Administered 2017-12-18: 4 mg via INTRAVENOUS
  Filled 2017-12-18: qty 2

## 2017-12-18 MED ORDER — KETOROLAC TROMETHAMINE 30 MG/ML IJ SOLN
15.0000 mg | Freq: Once | INTRAMUSCULAR | Status: AC
  Administered 2017-12-18: 15 mg via INTRAVENOUS
  Filled 2017-12-18: qty 1

## 2017-12-18 MED ORDER — SODIUM CHLORIDE 0.9 % IV SOLN
1000.0000 mL | Freq: Once | INTRAVENOUS | Status: AC
  Administered 2017-12-18: 1000 mL via INTRAVENOUS

## 2017-12-18 MED ORDER — NAPROXEN 500 MG PO TABS: 500.0000 mg | ORAL_TABLET | Freq: Two times a day (BID) | ORAL | 2 refills | Status: DC

## 2017-12-18 NOTE — ED Triage Notes (Signed)
Pt c/o right-sided flank pain. Dx with kidney stone x 2 days ago here. Pain currently 9/10, burning pain. Took percocet approx 1 hour ago. Pain persists.

## 2017-12-18 NOTE — ED Provider Notes (Signed)
[facility] Emergency Department Provider Note   ____________________________________________    I have reviewed the triage vital signs and the nursing notes.   HISTORY  Chief Complaint Flank Pain     HPI Benjamin Mills is a 72 y.o. male who presents with complaints of right-sided flank pain.  Patient was seen here 2 days ago and diagnosed with a 4 mm kidney stone, right UVJ.  Reports he did well yesterday but this morning around 5:30 AM developed pain, no improvement after taking a Percocet, he did take another one without significant improvement either.  Reports nausea and vomiting.  No fevers or chills.  Does not yet have appointment with urology  Past Medical History:  Diagnosis Date  . Amputation of finger, left    5th finger   . Arthritis    hands, back lumbar sacral   . Cancer ([place]) 2006   Prostate cancer with resection radical prostate removal. Urologist Dr. Hollice Espy locally; prostate ca dx'ed in 2007 and 2015   . Carotid artery stenosis    07/26/16 US carotid b/l 1-39%   . Cataract    s/p surgery b/l Dr. Oralia Manis [facility] 2018  . Depression   . Diverticulosis   . Erectile dysfunction   . Gastric ulcer   . GERD (gastroesophageal reflux disease)   . Hepatic steatosis   . History of splenectomy   . Hyperlipidemia   . Spinal stenosis   . Spinal stenosis at L4-L5 level   . Thoracic aortic atherosclerosis ([place])   . Urinary stress incontinence, male     Patient Active Problem List   Diagnosis Date Noted  . Lumbar radiculopathy 11/09/2017  . Spinal stenosis at L4-L5 level 08/16/2017  . Bunion of great toe of left foot 08/16/2017  . Seborrheic keratoses 08/16/2017  . Multiple benign nevi 08/16/2017  . Spondylosis of lumbosacral region 03/30/2017  . Arthritis 03/30/2017  . Benign skin lesion 11/05/2015  . SUI (stress urinary incontinence), male 12/10/2014  . Erectile dysfunction following radical prostatectomy 12/10/2014    . Adjustment disorder with mixed anxiety and depressed mood 08/06/2014  . Hyperlipidemia 06/05/2014  . History of prostate cancer 06/05/2014  . History of splenectomy 06/05/2014  . H/O cold sores 06/05/2014    Past Surgical History:  Procedure Laterality Date  . INCONTINENCE SURGERY    . KNEE ARTHROSCOPY N/A 1969   meniscal tear. left   . PROSTATECTOMY  2007   removal of prostate  . REPAIR OF PERFORATED ULCER     2009  . SPLENECTOMY  1953   after trauma, fell out tree as kid  . VENTRAL HERNIA REPAIR      Prior to Admission medications   Medication Sig Start Date End Date Taking? Authorizing Provider  acetaminophen (TYLENOL) 500 MG tablet Take 1 tablet (500 mg total) by mouth every 4 (four) hours as needed. 11/09/17   McLean-Scocuzza, Nino Glow, MD  betamethasone dipropionate (DIPROLENE) 0.05 % cream Apply topically as needed. Reported on 06/03/2015    [provider]  calcium carbonate (OS-CAL) 600 MG TABS tablet Take 600 mg by mouth 2 (two) times daily with a meal.    [provider]  DULoxetine (CYMBALTA) 60 MG capsule Take 1 capsule (60 mg total) by mouth daily. 11/08/16   Coral Spikes, DO  ferrous sulfate 325 (65 FE) MG EC tablet Take 325 mg by mouth 3 (three) times daily with meals.    [provider]  IRON  PO Take by mouth.    [provider]  meloxicam (MOBIC) 7.5 MG tablet Take 1-2 tablets (7.5-15 mg total) by mouth daily. 11/09/17   McLean-Scocuzza, Nino Glow, MD  Multiple Vitamin (MULTIVITAMIN) capsule Take 1 capsule by mouth daily.    [provider]  naproxen (NAPROSYN) 500 MG tablet Take 1 tablet (500 mg total) by mouth 2 (two) times daily with a meal. 12/18/17   Lavonia Drafts, MD  oxyCODONE-acetaminophen (PERCOCET) 5-325 MG tablet Take 1 tablet by mouth every 4 (four) hours as needed for moderate pain or severe pain. 12/16/17 12/16/18  Orbie Pyo, MD  predniSONE (DELTASONE) 10 MG tablet Take 60mg  by mouth on day 1, then  taper by 10mg  daily until gone 11/09/17   McLean-Scocuzza, Nino Glow, MD  sildenafil (REVATIO) 20 MG tablet Take 20 mg by mouth 3 (three) times daily as needed. Reported on 06/03/2015    [provider]  tamsulosin (FLOMAX) 0.4 MG CAPS capsule Take 1 capsule (0.4 mg total) by mouth daily. 12/16/17   Schaevitz, Randall An, MD  traMADol (ULTRAM) 50 MG tablet Take 1 tablet (50 mg total) by mouth every 8 (eight) hours as needed. 11/09/17   McLean-Scocuzza, Nino Glow, MD  valACYclovir (VALTREX) 1000 MG tablet Take 1 tablet (1,000 mg total) by mouth 2 (two) times daily as needed. 08/17/16   Coral Spikes, DO     Allergies Patient has no known allergies.  Family History  Problem Relation Age of Onset  . Dementia Mother   . Glaucoma Mother   . Alcohol abuse Father   . Glaucoma Sister   . Heart disease Brother        heart valve issue  . Multiple sclerosis Brother   . Kidney disease Neg Hx   . Prostate cancer Neg Hx     Social History Social History   Tobacco Use  . Smoking status: Former Smoker    Last attempt to quit: 06/05/1978    Years since quitting: 39.5  . Smokeless tobacco: Never Used  . Tobacco comment: quit in 1980s duration 6-7 years 1 ppd no FH lung cancer   Substance Use Topics  . Alcohol use: Yes    Alcohol/week: 0.0 standard drinks    Comment: OCC  . Drug use: No    Review of Systems  Constitutional: No fever/chills Eyes: No visual changes.  ENT: No sore throat. Cardiovascular: Denies chest pain. Respiratory: Denies shortness of breath. Gastrointestinal: As above Genitourinary: No dysuria, mild hematuria Musculoskeletal: Flank pain Skin: Negative for rash. Neurological: Negative for headaches    ____________________________________________   PHYSICAL EXAM:  VITAL SIGNS: ED Triage Vitals  Enc Vitals Group     BP 12/18/17 1122 (!) 147/67     Pulse Rate 12/18/17 1122 63     Resp 12/18/17 1122 14     Temp 12/18/17 1122 (!) 97.5 F (36.4 C)     Temp  Source 12/18/17 1122 Oral     SpO2 12/18/17 1122 98 %     Weight 12/18/17 1122 86.6 kg (191 lb)     Height 12/18/17 1122 1.727 m (5\' 8" )     Head Circumference --      Peak Flow --      Pain Score 12/18/17 1132 9     Pain Loc --      Pain Edu? --      Excl. in [place]? --     Constitutional: Alert and oriented.     Cardiovascular: Normal  rate, regular rhythm Good peripheral circulation. Respiratory: Normal respiratory effort.  No retractions.  Gastrointestinal: Soft and nontender. No distention.  No CVA tenderness.   Musculoskeletal:   Warm and well perfused Neurologic:  Normal speech and language. No gross focal neurologic deficits are appreciated.  Skin:  Skin is warm, dry and intact. No rash noted. Psychiatric: Mood and affect are normal. Speech and behavior are normal.  ____________________________________________   LABS (all labs ordered are listed, but only abnormal results are displayed)  Labs Reviewed  CBC WITH DIFFERENTIAL/PLATELET - Abnormal; Notable for the following components:      Result Value   WBC 13.6 (*)    RBC 4.05 (*)    Hemoglobin 12.9 (*)    HCT 37.3 (*)    Neutro Abs 11.5 (*)    All other components within normal limits  BASIC METABOLIC PANEL - Abnormal; Notable for the following components:   Glucose, Bld 140 (*)    All other components within normal limits  URINALYSIS, COMPLETE (UACMP) WITH MICROSCOPIC - Abnormal; Notable for the following components:   Color, Urine YELLOW (*)    APPearance HAZY (*)    Ketones, ur 5 (*)    All other components within normal limits   ____________________________________________  EKG   ____________________________________________  RADIOLOGY  Reviewed CT scan from 12/16/2017 ____________________________________________   PROCEDURES  Procedure(s) performed: No  Procedures   Critical Care performed: No ____________________________________________   INITIAL IMPRESSION / ASSESSMENT AND PLAN / ED  COURSE  Pertinent labs & imaging results that were available during my care of the patient were reviewed by me and considered in my medical decision making (see chart for details).  Patient presents with right-sided flank pain consistent with known kidney stone.  Afebrile, urinalysis not consistent with infection.  Lab work demonstrates mildly elevated white blood cell count likely related to kidney stone/pain.  We will treat with 15 mg of IV Toradol, IV fluids, IV Zofran and reevaluate  ----------------------------------------- 2:36 PM on 12/18/2017 -----------------------------------------  Patient reports significant improvement in pain  ----------------------------------------- 3:43 PM on 12/18/2017 -----------------------------------------  Patient remains pain-free, will add on naproxen to his medication regimen at home, urology follow-up.  Return precautions discussed    ____________________________________________   FINAL CLINICAL IMPRESSION(S) / ED DIAGNOSES  Final diagnoses:  Kidney stone        Note:  This document was prepared using Dragon voice recognition software and may include unintentional dictation errors.    Lavonia Drafts, MD 12/18/17 1544

## 2017-12-19 ENCOUNTER — Encounter: Payer: Self-pay | Admitting: Urology

## 2017-12-19 ENCOUNTER — Other Ambulatory Visit: Payer: Self-pay | Admitting: Internal Medicine

## 2017-12-19 DIAGNOSIS — D72829 Elevated white blood cell count, unspecified: Secondary | ICD-10-CM

## 2017-12-19 DIAGNOSIS — N200 Calculus of kidney: Secondary | ICD-10-CM

## 2017-12-20 ENCOUNTER — Other Ambulatory Visit: Payer: Self-pay | Admitting: Family Medicine

## 2017-12-22 ENCOUNTER — Inpatient Hospital Stay: Payer: PPO | Attending: Radiation Oncology

## 2017-12-22 DIAGNOSIS — C61 Malignant neoplasm of prostate: Secondary | ICD-10-CM | POA: Insufficient documentation

## 2017-12-22 LAB — PSA: Prostatic Specific Antigen: <0.01 ng/mL (ref 0.00–4.00)

## 2017-12-29 ENCOUNTER — Ambulatory Visit: Payer: PPO | Admitting: Radiation Oncology

## 2018-01-04 ENCOUNTER — Ambulatory Visit
Admission: RE | Admit: 2018-01-04 | Discharge: 2018-01-04 | Disposition: A | Discharge status: Home / Self Care | Payer: PPO | Source: Ambulatory Visit | Attending: Radiation Oncology | Admitting: Radiation Oncology

## 2018-01-04 ENCOUNTER — Encounter: Payer: Self-pay | Admitting: Radiation Oncology

## 2018-01-04 ENCOUNTER — Other Ambulatory Visit: Payer: Self-pay

## 2018-01-04 VITALS — BP 130/78 | HR 72 | Temp 96.5°F | Resp 16 | Wt 187.5 lb

## 2018-01-04 DIAGNOSIS — C61 Malignant neoplasm of prostate: Secondary | ICD-10-CM | POA: Diagnosis not present

## 2018-01-04 DIAGNOSIS — Z923 Personal history of irradiation: Secondary | ICD-10-CM | POA: Diagnosis not present

## 2018-01-04 NOTE — Progress Notes (Signed)
Radiation Oncology Follow up Note  Name: Benjamin Mills   Date:   01/04/2018 MRN:  [id number] DOB: Jan 06, 1946    This 72 y.o. male presents to the clinic today for 2-1/2 year follow-up status post salvage radiation therapy for Gleason 7 adenocarcinoma the prostate.  REFERRING PROVIDER: Coral Spikes, DO  HPI: patient is a 72 year old male now out 2-1/2 years having completed salvage radiation therapy to his prostate status post prostatectomy in 2007. He is seen today in routine follow-up is doing well. He specifically denies diarrhea or any increased lower urinary tract symptoms. His most recent PSA is 0.01.Marland Kitchen  COMPLICATIONS OF TREATMENT: none  FOLLOW UP COMPLIANCE: keeps appointments   PHYSICAL EXAM:  BP 130/78 (BP Location: Left Arm)   Pulse 72   Temp (!) 96.5 F (35.8 C) (Tympanic)   Resp 16   Wt 187 lb 8 oz (85.1 kg)   BMI 28.51 kg/m  Well-developed well-nourished patient in NAD. HEENT reveals PERLA, EOMI, discs not visualized.  Oral cavity is clear. No oral mucosal lesions are identified. Neck is clear without evidence of cervical or supraclavicular adenopathy. Lungs are clear to A&P. Cardiac examination is essentially unremarkable with regular rate and rhythm without murmur rub or thrill. Abdomen is benign with no organomegaly or masses noted. Motor sensory and DTR levels are equal and symmetric in the upper and lower extremities. Cranial nerves II through XII are grossly intact. Proprioception is intact. No peripheral adenopathy or edema is identified. No motor or sensory levels are noted. Crude visual fields are within normal range.  RADIOLOGY RESULTS: no current films for review  PLAN: present time patient is under excellent biochemical control of his prostate cancer. I am please was overall progress. I've asked to see him back in 1 year with a PSA prior to his visit. Patient is to call with any concerns.  I would like to take this opportunity to thank you for allowing me to  participate in the care of your patient.Noreene Filbert, MD

## 2018-02-10 ENCOUNTER — Ambulatory Visit: Payer: PPO | Admitting: Internal Medicine

## 2018-02-17 ENCOUNTER — Encounter: Payer: Self-pay | Admitting: Internal Medicine

## 2018-02-17 ENCOUNTER — Ambulatory Visit (INDEPENDENT_AMBULATORY_CARE_PROVIDER_SITE_OTHER): Payer: PPO | Admitting: Internal Medicine

## 2018-02-17 VITALS — BP 124/78 | HR 61 | Temp 97.9°F | Resp 15 | Ht 68.0 in | Wt 191.0 lb

## 2018-02-17 DIAGNOSIS — N200 Calculus of kidney: Secondary | ICD-10-CM | POA: Insufficient documentation

## 2018-02-17 DIAGNOSIS — E785 Hyperlipidemia, unspecified: Secondary | ICD-10-CM | POA: Diagnosis not present

## 2018-02-17 DIAGNOSIS — M4805 Spinal stenosis, thoracolumbar region: Secondary | ICD-10-CM | POA: Diagnosis not present

## 2018-02-17 DIAGNOSIS — Z23 Encounter for immunization: Secondary | ICD-10-CM | POA: Diagnosis not present

## 2018-02-17 DIAGNOSIS — Z1329 Encounter for screening for other suspected endocrine disorder: Secondary | ICD-10-CM

## 2018-02-17 DIAGNOSIS — M2550 Pain in unspecified joint: Secondary | ICD-10-CM | POA: Diagnosis not present

## 2018-02-17 DIAGNOSIS — R319 Hematuria, unspecified: Secondary | ICD-10-CM | POA: Diagnosis not present

## 2018-02-17 DIAGNOSIS — D72829 Elevated white blood cell count, unspecified: Secondary | ICD-10-CM | POA: Diagnosis not present

## 2018-02-17 HISTORY — DX: Pain in unspecified joint: M25.50

## 2018-02-17 HISTORY — DX: Calculus of kidney: N20.0

## 2018-02-17 MED ORDER — MELOXICAM 7.5 MG PO TABS: 7.5000 mg | ORAL_TABLET | Freq: Every day | ORAL | 2 refills | Status: DC

## 2018-02-17 NOTE — Patient Instructions (Addendum)
Creams cetaphil or cerave    Dietary Guidelines to Help Prevent Kidney Stones Kidney stones are deposits of minerals and salts that form inside your kidneys. Your risk of developing kidney stones may be greater depending on your diet, your lifestyle, the medicines you take, and whether you have certain medical conditions. Most people can reduce their chances of developing kidney stones by following the instructions below. Depending on your overall health and the type of kidney stones you tend to develop, your dietitian may give you more specific instructions. What are tips for following this plan? Reading food labels  Choose foods with "no salt added" or "low-salt" labels. Limit your sodium intake to less than 1500 mg per day.  Choose foods with calcium for each meal and snack. Try to eat about 300 mg of calcium at each meal. Foods that contain 200-500 mg of calcium per serving include: ? 8 oz (237 ml) of milk, fortified nondairy milk, and fortified fruit juice. ? 8 oz (237 ml) of kefir, yogurt, and soy yogurt. ? 4 oz (118 ml) of tofu. ? 1 oz of cheese. ? 1 cup (300 g) of dried figs. ? 1 cup (91 g) of cooked broccoli. ? 1-3 oz can of sardines or mackerel.  Most people need 1000 to 1500 mg of calcium each day. Talk to your dietitian about how much calcium is recommended for you. Shopping  Buy plenty of fresh fruits and vegetables. Most people do not need to avoid fruits and vegetables, even if they contain nutrients that may contribute to kidney stones.  When shopping for convenience foods, choose: ? Whole pieces of fruit. ? Premade salads with dressing on the side. ? Low-fat fruit and yogurt smoothies.  Avoid buying frozen meals or prepared deli foods.  Look for foods with live cultures, such as yogurt and kefir. Cooking  Do not add salt to food when cooking. Place a salt shaker on the table and allow each person to add his or her own salt to taste.  Use vegetable protein, such as  beans, textured vegetable protein (TVP), or tofu instead of meat in pasta, casseroles, and soups. Meal planning  Eat less salt, if told by your dietitian. To do this: ? Avoid eating processed or premade food. ? Avoid eating fast food.  Eat less animal protein, including cheese, meat, poultry, or fish, if told by your dietitian. To do this: ? Limit the number of times you have meat, poultry, fish, or cheese each week. Eat a diet free of meat at least 2 days a week. ? Eat only one serving each day of meat, poultry, fish, or seafood. ? When you prepare animal protein, cut pieces into small portion sizes. For most meat and fish, one serving is about the size of one deck of cards.  Eat at least 5 servings of fresh fruits and vegetables each day. To do this: ? Keep fruits and vegetables on hand for snacks. ? Eat 1 piece of fruit or a handful of berries with breakfast. ? Have a salad and fruit at lunch. ? Have two kinds of vegetables at dinner.  Limit foods that are high in a substance called oxalate. These include: ? Spinach. ? Rhubarb. ? Beets. ? Potato chips and french fries. ? Nuts.  If you regularly take a diuretic medicine, make sure to eat at least 1-2 fruits or vegetables high in potassium each day. These include: ? Avocado. ? Banana. ? Orange, prune, carrot, or tomato juice. ? Baked potato. ?  Cabbage. ? Beans and split peas. General instructions  Drink enough fluid to keep your urine clear or pale yellow. This is the most important thing you can do.  Talk to your health care provider and dietitian about taking daily supplements. Depending on your health and the cause of your kidney stones, you may be advised: ? Not to take supplements with vitamin C. ? To take a calcium supplement. ? To take a daily probiotic supplement. ? To take other supplements such as magnesium, fish oil, or vitamin B6.  Take all medicines and supplements as told by your health care provider.  Limit  alcohol intake to no more than 1 drink a day for nonpregnant women and 2 drinks a day for men. One drink equals 12 oz of beer, 5 oz of wine, or 1 oz of hard liquor.  Lose weight if told by your health care provider. Work with your dietitian to find strategies and an eating plan that works best for you. What foods are not recommended? Limit your intake of the following foods, or as told by your dietitian. Talk to your dietitian about specific foods you should avoid based on the type of kidney stones and your overall health. Grains Breads. Bagels. Rolls. Baked goods. Salted crackers. Cereal. Pasta. Vegetables Spinach. Rhubarb. Beets. Canned vegetables. Benjamin Mills. Olives. Meats and other protein foods Nuts. Nut butters. Large portions of meat, poultry, or fish. Salted or cured meats. Deli meats. Hot dogs. Sausages. Dairy Cheese. Beverages Regular soft drinks. Regular vegetable juice. Seasonings and other foods Seasoning blends with salt. Salad dressings. Canned soups. Soy sauce. Ketchup. Barbecue sauce. Canned pasta sauce. Casseroles. Pizza. Lasagna. Frozen meals. Potato chips. [place] fries. Summary  You can reduce your risk of kidney stones by making changes to your diet.  The most important thing you can do is drink enough fluid. You should drink enough fluid to keep your urine clear or pale yellow.  Ask your health care provider or dietitian how much protein from animal sources you should eat each day, and also how much salt and calcium you should have each day. This information is not intended to replace advice given to you by your health care provider. Make sure you discuss any questions you have with your health care provider. Document Released: 07/24/2010 Document Revised: 03/09/2016 Document Reviewed: 03/09/2016 Elsevier Interactive Patient Education  2018 [employer].  Kidney Stones Kidney stones (urolithiasis) are solid, rock-like deposits that form inside of the organs that make  urine (kidneys). A kidney stone may form in a kidney and move into the bladder, where it can cause intense pain and block the flow of urine. Kidney stones are created when high levels of certain minerals are found in the urine. They are usually passed through urination, but in some cases, medical treatment may be needed to remove them. What are the causes? Kidney stones may be caused by:  A condition in which certain glands produce too much parathyroid hormone (primary hyperparathyroidism), which causes too much calcium buildup in the blood.  Buildup of uric acid crystals in the bladder (hyperuricosuria). Uric acid is a chemical that the body produces when you eat certain foods. It usually exits the body in the urine.  Narrowing (stricture) of one or both of the tubes that drain urine from the kidneys to the bladder (ureters).  A kidney blockage that is present at birth (congenital obstruction).  Past surgery on the kidney or the ureters, such as gastric bypass surgery.  What increases the  risk? The following factors make you more likely to develop kidney stones:  Having had a kidney stone in the past.  Having a family history of kidney stones.  Not drinking enough water.  Eating a diet that is high in protein, salt (sodium), or sugar.  Being overweight or obese.  What are the signs or symptoms? Symptoms of a kidney stone may include:  Nausea.  Vomiting.  Blood in the urine (hematuria).  Pain in the side of the abdomen, right below the ribs (flank pain). Pain usually spreads (radiates) to the groin.  Needing to urinate frequently or urgently.  How is this diagnosed? This condition may be diagnosed based on:  Your medical history.  A physical exam.  Blood tests.  Urine tests.  CT scan.  Abdominal X-ray.  A procedure to examine the inside of the bladder (cystoscopy).  How is this treated? Treatment for kidney stones depends on the size, location, and makeup of  the stones. Treatment may involve:  Analyzing your urine before and after you pass the stone through urination.  Being monitored at the hospital until you pass the stone through urination.  Increasing your fluid intake and decreasing the amount of calcium and protein in your diet.  A procedure to break up kidney stones in the bladder using: ? A focused beam of light (laser therapy). ? Shock waves (extracorporeal shock wave lithotripsy).  Surgery to remove kidney stones. This may be needed if you have severe pain or have stones that block your urinary tract.  Follow these instructions at home: Eating and drinking   Drink enough fluid to keep your urine clear or pale yellow. This will help you to pass the kidney stone.  If directed, change your diet. This may include: ? Limiting how much sodium you eat. ? Eating more fruits and vegetables. ? Limiting how much meat, poultry, fish, and eggs you eat.  Follow instructions from your health care provider about eating or drinking restrictions. General instructions  Collect urine samples as told by your health care provider. You may need to collect a urine sample: ? 24 hours after you pass the stone. ? 8-12 weeks after passing the kidney stone, and every 6-12 months after that.  Strain your urine every time you urinate, for as long as directed. Use the strainer that your health care provider recommends.  Do not throw out the kidney stone after passing it. Keep the stone so it can be tested by your health care provider. Testing the makeup of your kidney stone may help prevent you from getting kidney stones in the future.  Take over-the-counter and prescription medicines only as told by your health care provider.  Keep all follow-up visits as told by your health care provider. This is important. You may need follow-up X-rays or ultrasounds to make sure that your stone has passed. How is this prevented? To prevent another kidney  stone:  Drink enough fluid to keep your urine clear or pale yellow. This is the best way to prevent kidney stones.  Eat a healthy diet and follow recommendations from your health care provider about foods to avoid. You may be instructed to eat a low-protein diet. Recommendations vary depending on the type of kidney stone that you have.  Maintain a healthy weight.  Contact a health care provider if:  You have pain that gets worse or does not get better with medicine. Get help right away if:  You have a fever or chills.  You develop  severe pain.  You develop new abdominal pain.  You faint.  You are unable to urinate. This information is not intended to replace advice given to you by your health care provider. Make sure you discuss any questions you have with your health care provider. Document Released: 03/29/2005 Document Revised: 10/17/2015 Document Reviewed: 09/12/2015 Elsevier Interactive Patient Education  [employer].

## 2018-02-17 NOTE — Progress Notes (Signed)
Chief Complaint  Patient presents with  . Follow-up   F/u  1. Right flank pain resolved 12/16/17 CT 4 x 2 x 2 mm distal ureter stone not passed yet pt stopped flomax had leukocytosis and hematuria  2. Arthritis takes prn Mobic and Tylenol  3. Would like flu shot today   Review of Systems  Constitutional: Negative for weight loss.  HENT: Negative for hearing loss.   Eyes: Negative for blurred vision.  Respiratory: Negative for shortness of breath.   Cardiovascular: Negative for chest pain.  Gastrointestinal: Negative for abdominal pain.  Genitourinary:       Denies flank pain  Musculoskeletal: Positive for joint pain.       +joint pain hands   Skin: Negative for rash.  Neurological: Negative for headaches.  Psychiatric/Behavioral: Negative for depression.   Past Medical History:  Diagnosis Date  . Amputation of finger, left    5th finger   . Arthritis    hands, back lumbar sacral   . Cancer ([place]) 2006   Prostate cancer with resection radical prostate removal. Urologist Dr. Hollice Espy locally; prostate ca dx'ed in 2007 and 2015   . Carotid artery stenosis    07/26/16 US carotid b/l 1-39%   . Cataract    s/p surgery b/l Dr. [facility] 2018  . Depression   . Diverticulosis   . Erectile dysfunction   . Gastric ulcer   . GERD (gastroesophageal reflux disease)   . Hepatic steatosis   . History of splenectomy   . Hyperlipidemia   . Spinal stenosis   . Spinal stenosis at L4-L5 level   . Thoracic aortic atherosclerosis ([place])   . Urinary stress incontinence, male    Past Surgical History:  Procedure Laterality Date  . INCONTINENCE SURGERY    . KNEE ARTHROSCOPY N/A 1969   meniscal tear. left   . PROSTATECTOMY  2007   removal of prostate  . REPAIR OF PERFORATED ULCER     2009  . SPLENECTOMY  1953   after trauma, fell out tree as kid  . VENTRAL HERNIA REPAIR     Family History  Problem Relation Age of Onset  . Dementia Mother   . Glaucoma Mother   .  Alcohol abuse Father   . Glaucoma Sister   . Heart disease Brother        heart valve issue  . Multiple sclerosis Brother   . Kidney disease Neg Hx   . Prostate cancer Neg Hx    Social History   Socioeconomic History  . Marital status: Married    Spouse name: Not on file  . Number of children: Not on file  . Years of education: Not on file  . Highest education level: Not on file  Occupational History  . Not on file  Social Needs  . Financial resource strain: Not on file  . Food insecurity:    Worry: Not on file    Inability: Not on file  . Transportation needs:    Medical: Not on file    Non-medical: Not on file  Tobacco Use  . Smoking status: Former Smoker    Last attempt to quit: 06/05/1978    Years since quitting: 39.7  . Smokeless tobacco: Never Used  . Tobacco comment: quit in 1980s duration 6-7 years 1 ppd no FH lung cancer   Substance and Sexual Activity  . Alcohol use: Yes    Alcohol/week: 0.0 standard drinks    Comment: OCC  .  Drug use: No  . Sexual activity: Not Currently  Lifestyle  . Physical activity:    Days per week: Not on file    Minutes per session: Not on file  . Stress: Not on file  Relationships  . Social connections:    Talks on phone: Not on file    Gets together: Not on file    Attends religious service: Not on file    Active member of club or organization: Not on file    Attends meetings of clubs or organizations: Not on file    Relationship status: Not on file  . Intimate partner violence:    Fear of current or ex partner: Not on file    Emotionally abused: Not on file    Physically abused: Not on file    Forced sexual activity: Not on file  Other Topics Concern  . Not on file  Social History Narrative   Born in [place].   Lived in [place].    Moved to [place], brother lives in [place]       Has 2 brothers and 1 sister.      Lives with wife in [place]. Has 3 daughters.      Work - retired, Surveyor, quantity       Diet - regular      Exercise - walks occasionally   Current Meds  Medication Sig  . acetaminophen (TYLENOL) 500 MG tablet Take 1 tablet (500 mg total) by mouth every 4 (four) hours as needed.  . betamethasone dipropionate (DIPROLENE) 0.05 % cream Apply topically as needed. Reported on 06/03/2015  . calcium carbonate (OS-CAL) 600 MG TABS tablet Take 600 mg by mouth 2 (two) times daily with a meal.  . DULoxetine (CYMBALTA) 60 MG capsule TAKE 1 CAPSULE BY MOUTH ONCE DAILY  . ferrous sulfate 325 (65 FE) MG EC tablet Take 325 mg by mouth 3 (three) times daily with meals.  . IRON PO Take by mouth.  . meloxicam (MOBIC) 7.5 MG tablet Take 1-2 tablets (7.5-15 mg total) by mouth daily.  . Multiple Vitamin (MULTIVITAMIN) capsule Take 1 capsule by mouth daily.  . valACYclovir (VALTREX) 1000 MG tablet Take 1 tablet (1,000 mg total) by mouth 2 (two) times daily as needed.   No Known Allergies Recent Results (from the past 2160 hour(s))  CBC with Differential/Platelet     Status: Abnormal   Collection Time: 11/21/17  4:15 PM  Result Value Ref Range   WBC 9.4 4.0 - 10.5 K/uL   RBC 4.19 (L) 4.22 - 5.81 Mil/uL   Hemoglobin 13.0 13.0 - 17.0 g/dL   HCT 39.4 39.0 - 52.0 %   MCV 94.0 78.0 - 100.0 fl   MCHC 33.1 30.0 - 36.0 g/dL   RDW 13.7 11.5 - 15.5 %   Platelets 217.0 150.0 - 400.0 K/uL   Neutrophils Relative % 67.3 43.0 - 77.0 %   Lymphocytes Relative 20.1 12.0 - 46.0 %   Monocytes Relative 7.9 3.0 - 12.0 %   Eosinophils Relative 3.7 0.0 - 5.0 %   Basophils Relative 1.0 0.0 - 3.0 %   Neutro Abs 6.3 1.4 - 7.7 K/uL   Lymphs Abs 1.9 0.7 - 4.0 K/uL   Monocytes Absolute 0.7 0.1 - 1.0 K/uL   Eosinophils Absolute 0.3 0.0 - 0.7 K/uL   Basophils Absolute 0.1 0.0 - 0.1 K/uL  Hemoglobin A1c     Status: None   Collection Time: 11/21/17  4:15 PM  Result Value Ref  Range   Hgb A1c MFr Bld 6.1 4.6 - 6.5 %    Comment: Glycemic Control Guidelines for People with Diabetes:Non Diabetic:  <6%Goal of Therapy:  <7%Additional Action Suggested:  >8%   CBC with Differential     Status: Abnormal   Collection Time: 12/16/17  6:28 PM  Result Value Ref Range   WBC 10.2 3.8 - 10.6 K/uL   RBC 4.28 (L) 4.40 - 5.90 MIL/uL   Hemoglobin 13.3 13.0 - 18.0 g/dL   HCT 39.8 (L) 40.0 - 52.0 %   MCV 93.0 80.0 - 100.0 fL   MCH 31.0 26.0 - 34.0 pg   MCHC 33.4 32.0 - 36.0 g/dL   RDW 13.5 11.5 - 14.5 %   Platelets 256 150 - 440 K/uL   Neutrophils Relative % 77 %   Neutro Abs 7.9 (H) 1.4 - 6.5 K/uL   Lymphocytes Relative 13 %   Lymphs Abs 1.3 1.0 - 3.6 K/uL   Monocytes Relative 7 %   Monocytes Absolute 0.7 0.2 - 1.0 K/uL   Eosinophils Relative 2 %   Eosinophils Absolute 0.2 0 - 0.7 K/uL   Basophils Relative 1 %   Basophils Absolute 0.1 0 - 0.1 K/uL    Comment: Performed at [facility], [address] [place], [place] [address]  Comprehensive metabolic panel     Status: Abnormal   Collection Time: 12/16/17  6:28 PM  Result Value Ref Range   Sodium 142 135 - 145 mmol/L   Potassium 5.0 3.5 - 5.1 mmol/L   Chloride 106 98 - 111 mmol/L   CO2 30 22 - 32 mmol/L   Glucose, Bld 101 (H) 70 - 99 mg/dL   BUN 25 (H) 8 - 23 mg/dL   Creatinine, Ser 0.84 0.61 - 1.24 mg/dL   Calcium 10.2 8.9 - 10.3 mg/dL   Total Protein 7.3 6.5 - 8.1 g/dL   Albumin 4.5 3.5 - 5.0 g/dL   AST 26 15 - 41 U/L   ALT 20 0 - 44 U/L   Alkaline Phosphatase 76 38 - 126 U/L   Total Bilirubin 0.9 0.3 - 1.2 mg/dL   GFR calc non Af Amer >60 >60 mL/min   GFR calc Af Amer >60 >60 mL/min    Comment: (NOTE) The eGFR has been calculated using the CKD EPI equation. This calculation has not been validated in all clinical situations. eGFR's persistently <60 mL/min signify possible Chronic Kidney Disease.    Anion gap 6 5 - 15    Comment: Performed at [facility], [address] [place], [place] [address]  Urinalysis, Complete w Microscopic     Status: Abnormal   Collection Time: 12/16/17  7:03 PM  Result Value Ref Range    Color, Urine YELLOW (A) YELLOW   APPearance HAZY (A) CLEAR   Specific Gravity, Urine 1.018 1.005 - 1.030   pH 7.0 5.0 - 8.0   Glucose, UA NEGATIVE NEGATIVE mg/dL   Hgb urine dipstick SMALL (A) NEGATIVE   Bilirubin Urine NEGATIVE NEGATIVE   Ketones, ur 20 (A) NEGATIVE mg/dL   Protein, ur NEGATIVE NEGATIVE mg/dL   Nitrite NEGATIVE NEGATIVE   Leukocytes, UA NEGATIVE NEGATIVE   RBC / HPF 11-20 0 - 5 RBC/hpf   WBC, UA 0-5 0 - 5 WBC/hpf   Bacteria, UA NONE SEEN NONE SEEN   Squamous Epithelial / LPF NONE SEEN 0 - 5   Mucus PRESENT     Comment: Performed at [facility], [address] [place], [place] [address]  Urinalysis, Complete w Microscopic     Status: Abnormal   Collection Time: 12/18/17 11:23 AM  Result Value Ref Range   Color, Urine YELLOW (A) YELLOW   APPearance HAZY (A) CLEAR   Specific Gravity, Urine 1.024 1.005 - 1.030   pH 7.0 5.0 - 8.0   Glucose, UA NEGATIVE NEGATIVE mg/dL   Hgb urine dipstick NEGATIVE NEGATIVE   Bilirubin Urine NEGATIVE NEGATIVE   Ketones, ur 5 (A) NEGATIVE mg/dL   Protein, ur NEGATIVE NEGATIVE mg/dL   Nitrite NEGATIVE NEGATIVE   Leukocytes, UA NEGATIVE NEGATIVE   RBC / HPF 11-20 0 - 5 RBC/hpf   WBC, UA 0-5 0 - 5 WBC/hpf   Bacteria, UA NONE SEEN NONE SEEN   Squamous Epithelial / LPF 0-5 0 - 5   Mucus PRESENT     Comment: Performed at [facility], [address] [place], [place] [address]  CBC with Differential     Status: Abnormal   Collection Time: 12/18/17 11:40 AM  Result Value Ref Range   WBC 13.6 (H) 3.8 - 10.6 K/uL   RBC 4.05 (L) 4.40 - 5.90 MIL/uL   Hemoglobin 12.9 (L) 13.0 - 18.0 g/dL   HCT 37.3 (L) 40.0 - 52.0 %   MCV 92.0 80.0 - 100.0 fL   MCH 31.9 26.0 - 34.0 pg   MCHC 34.7 32.0 - 36.0 g/dL   RDW 13.5 11.5 - 14.5 %   Platelets 232 150 - 440 K/uL   Neutrophils Relative % 84 %   Neutro Abs 11.5 (H) 1.4 - 6.5 K/uL   Lymphocytes Relative 9 %   Lymphs Abs 1.3 1.0 - 3.6 K/uL   Monocytes Relative 6 %    Monocytes Absolute 0.7 0.2 - 1.0 K/uL   Eosinophils Relative 1 %   Eosinophils Absolute 0.1 0 - 0.7 K/uL   Basophils Relative 0 %   Basophils Absolute 0.0 0 - 0.1 K/uL    Comment: Performed at [facility], [address] [place], [place] [address]  Basic metabolic panel     Status: Abnormal   Collection Time: 12/18/17 11:40 AM  Result Value Ref Range   Sodium 138 135 - 145 mmol/L   Potassium 4.3 3.5 - 5.1 mmol/L   Chloride 106 98 - 111 mmol/L   CO2 27 22 - 32 mmol/L   Glucose, Bld 140 (H) 70 - 99 mg/dL   BUN 17 8 - 23 mg/dL   Creatinine, Ser 0.92 0.61 - 1.24 mg/dL   Calcium 9.1 8.9 - 10.3 mg/dL   GFR calc non Af Amer >60 >60 mL/min   GFR calc Af Amer >60 >60 mL/min    Comment: (NOTE) The eGFR has been calculated using the CKD EPI equation. This calculation has not been validated in all clinical situations. eGFR's persistently <60 mL/min signify possible Chronic Kidney Disease.    Anion gap 5 5 - 15    Comment: Performed at [facility], [address] [place],  [address]  PSA     Status: None   Collection Time: 12/22/17  8:03 AM  Result Value Ref Range   Prostatic Specific Antigen <0.01 0.00 - 4.00 ng/mL    Comment: RESULT REPEATED AND VERIFIED (NOTE) While PSA levels of <=4.0 ng/ml are reported as reference range, some men with levels below 4.0 ng/ml can have prostate cancer and many men with PSA above 4.0 ng/ml do not have prostate cancer.  Other tests such as  free PSA, age specific reference ranges, PSA velocity and PSA doubling time may be helpful especially in men less than 79 years old. Performed at [facility], [place] [address] [place], [place] [address]    Objective  Body mass index is 29.04 kg/m. Wt Readings from Last 3 Encounters:  02/17/18 191 lb (86.6 kg)  01/04/18 187 lb 8 oz (85.1 kg)  12/18/17 191 lb (86.6 kg)   Temp Readings from Last 3 Encounters:  02/17/18 97.9 F (36.6 C) (Oral)  01/04/18 (!) 96.5 F (35.8  C) (Tympanic)  12/18/17 (!) 97.5 F (36.4 C) (Oral)   BP Readings from Last 3 Encounters:  02/17/18 124/78  01/04/18 130/78  12/18/17 131/66   Pulse Readings from Last 3 Encounters:  02/17/18 61  01/04/18 72  12/18/17 74    Physical Exam  Constitutional: He is oriented to person, place, and time. Vital signs are normal. He appears well-developed and well-nourished. He is cooperative.  HENT:  Head: Normocephalic and atraumatic.  Mouth/Throat: Oropharynx is clear and moist and mucous membranes are normal.  Eyes: Pupils are equal, round, and reactive to light. Conjunctivae are normal.  Cardiovascular: Normal rate, regular rhythm and normal heart sounds.  Pulmonary/Chest: Effort normal and breath sounds normal.  Abdominal: Soft. Bowel sounds are normal.  Neurological: He is alert and oriented to person, place, and time. Gait normal.  Skin: Skin is warm, dry and intact.  Psychiatric: He has a normal mood and affect. His speech is normal and behavior is normal. Judgment and thought content normal. Cognition and memory are normal.  Nursing note and vitals reviewed.   Assessment   1. Right flank pain +kidney stones right distal ureter 4 x 2 x 2 mm  2. Arthritis hands and back  3. HM Plan   1. Given info kidney stones  Check CBC and UA  2. Prn mobic and tylenol  3.  Check fasting labs   Had flu shot today high dose  Tdap 2015 Had prevnar and pna 23 shingrix Rx has not had yet   Need to get colonoscopy record just have nl per report in chartgiven release of records pt signed again today to get from [place]  -sent 2nd request   Fuurology. Rad onc apptsh/o prostate cancer PSA neg 12/2017  dermatology established with [facility].   Hep C neg 04/08/17   Former smoker quit in 1980s smoked x 6-7 years no FH lung cancer smoked 1 ppd   Provider: Dr. Olivia Mackie McLean-Scocuzza-Internal Medicine

## 2018-02-21 ENCOUNTER — Encounter: Payer: Self-pay | Admitting: Internal Medicine

## 2018-02-25 DIAGNOSIS — F4320 Adjustment disorder, unspecified: Secondary | ICD-10-CM | POA: Diagnosis not present

## 2018-03-14 ENCOUNTER — Other Ambulatory Visit: Payer: Self-pay

## 2018-03-14 DIAGNOSIS — C61 Malignant neoplasm of prostate: Secondary | ICD-10-CM

## 2018-03-15 ENCOUNTER — Other Ambulatory Visit: Payer: PPO

## 2018-03-15 DIAGNOSIS — C61 Malignant neoplasm of prostate: Secondary | ICD-10-CM

## 2018-03-16 LAB — PSA

## 2018-03-21 ENCOUNTER — Ambulatory Visit: Payer: PPO | Admitting: Urology

## 2018-03-21 ENCOUNTER — Encounter: Payer: Self-pay | Admitting: Urology

## 2018-03-21 VITALS — BP 138/82 | HR 75 | Ht 68.0 in | Wt 186.0 lb

## 2018-03-21 DIAGNOSIS — N201 Calculus of ureter: Secondary | ICD-10-CM | POA: Diagnosis not present

## 2018-03-21 DIAGNOSIS — C61 Malignant neoplasm of prostate: Secondary | ICD-10-CM | POA: Diagnosis not present

## 2018-03-21 DIAGNOSIS — N5231 Erectile dysfunction following radical prostatectomy: Secondary | ICD-10-CM | POA: Diagnosis not present

## 2018-03-21 NOTE — Progress Notes (Signed)
03/21/2018 9:37 AM   Jasmine Awe Rinke 1945/12/10 [id number]  Referring provider: Coral Spikes, DO [address] [place], [place] [address]  Chief Complaint  Patient presents with  . Prostate Cancer    HPI: 72 year old male with personal history of prostate cancer who returns today for routine annual follow-up.  Notably, he was  History of prostate cancer/ rising PSA He was diagnosed and underwent laparoscopic radical prostatectomy at [facility] in [place] back in 2007.  Pathology showed Gleason 3+4, [id number].  He developed evidence of biochemical recurrence with negative staging in 05/2015 with a rise in his PSA to 0.2. He underwent salvage radiation with Dr. Baruch Gouty in 07/2015 and started on Lupron x 1 year.  His libido has improved and no further hot flashes.   PSA on 04/24/2012 was 0.060 at [facility] PSA drawn by his PCP was 0.11 performed on 07/03/14  0.1 on 11/2014 0.2 on 05/28/15 <0.01 on 12/24/15 <0.01 on 12/2016 <0.01 on 12/2017 <0.1 on 03/2018  ED Severe erectile dysfunction following prostatectomy Previously failed PDE 5 inhibitor secondary to headache  SUI Minimal stress incontinence following mid urethral sling placement in 2014.  Unchanged.  No voiding complaints today.    History of hematuria Cysto negative for any bladder pathology on 09/10/14.  CT Urogram 10/2014 negative.  NED.  No further episodes of gross hematuria.  Kidney stones  Seen in the emergency room on 2 occasions in 12/2017 with a 4 mm right UVJ stone by CT scan.  His pain resolved that same day but he never saw the stone pass.  No personal history of stones.      This was his first and only stone episode.  He does try to drink lots of water.  He avoid sodas.  He tries eat a low-salt diet.  He has been actively trying to lose weight.  No other stones on CT scan.     PMH: Past Medical History:  Diagnosis Date  . Amputation of finger, left    5th finger   .  Arthritis    hands, back lumbar sacral   . Cancer ([place]) 2006   Prostate cancer with resection radical prostate removal. Urologist Dr. Hollice Espy locally; prostate ca dx'ed in 2007 and 2015   . Carotid artery stenosis    07/26/16 US carotid b/l 1-39%   . Cataract    s/p surgery b/l Dr. Oralia Manis [facility] 2018  . Depression   . Diverticulosis   . Erectile dysfunction   . Gastric ulcer   . GERD (gastroesophageal reflux disease)   . Hepatic steatosis   . History of splenectomy   . Hyperlipidemia   . Kidney stone   . Spinal stenosis   . Spinal stenosis at L4-L5 level   . Thoracic aortic atherosclerosis ([place])   . Urinary stress incontinence, male     Surgical History: Past Surgical History:  Procedure Laterality Date  . INCONTINENCE SURGERY    . KNEE ARTHROSCOPY N/A 1969   meniscal tear. left   . PROSTATECTOMY  2007   removal of prostate  . REPAIR OF PERFORATED ULCER     2009  . SPLENECTOMY  1953   after trauma, fell out tree as kid  . VENTRAL HERNIA REPAIR      Home Medications:  Allergies as of 03/21/2018   No Known Allergies     Medication List        Accurate as of 03/21/18  9:37 AM. Always  use your most recent med list.          acetaminophen 500 MG tablet Commonly known as:  TYLENOL Take 1 tablet (500 mg total) by mouth every 4 (four) hours as needed.   aspirin EC 81 MG tablet Take 81 mg by mouth daily.   betamethasone dipropionate 0.05 % cream Commonly known as:  DIPROLENE Apply topically as needed. Reported on 06/03/2015   calcium carbonate 600 MG Tabs tablet Commonly known as:  OS-CAL Take 600 mg by mouth 2 (two) times daily with a meal.   DULoxetine 60 MG capsule Commonly known as:  CYMBALTA TAKE 1 CAPSULE BY MOUTH ONCE DAILY   ferrous sulfate 325 (65 FE) MG EC tablet Take 325 mg by mouth 3 (three) times daily with meals.   IRON PO Take by mouth.   meloxicam 7.5 MG tablet Commonly known as:  MOBIC Take 1-2 tablets (7.5-15 mg  total) by mouth daily.   multivitamin capsule Take 1 capsule by mouth daily.   naproxen 500 MG tablet Commonly known as:  NAPROSYN Take 1 tablet (500 mg total) by mouth 2 (two) times daily with a meal.   valACYclovir 1000 MG tablet Commonly known as:  VALTREX Take 1 tablet (1,000 mg total) by mouth 2 (two) times daily as needed.       Allergies: No Known Allergies  Family History: Family History  Problem Relation Age of Onset  . Dementia Mother   . Glaucoma Mother   . Alcohol abuse Father   . Glaucoma Sister   . Heart disease Brother        heart valve issue  . Multiple sclerosis Brother   . Kidney disease Neg Hx   . Prostate cancer Neg Hx     Social History:  reports that he quit smoking about 39 years ago. He has never used smokeless tobacco. He reports that he drinks alcohol. He reports that he does not use drugs.  ROS: UROLOGY Frequent Urination?: No Hard to postpone urination?: No Burning/pain with urination?: No Get up at night to urinate?: No Leakage of urine?: No Urine stream starts and stops?: No Trouble starting stream?: No Do you have to strain to urinate?: No Blood in urine?: No Urinary tract infection?: No Sexually transmitted disease?: No Injury to kidneys or bladder?: No Painful intercourse?: No Weak stream?: No Erection problems?: No Penile pain?: No  Gastrointestinal Nausea?: No Vomiting?: No Indigestion/heartburn?: No Diarrhea?: No Constipation?: No  Constitutional Fever: No Night sweats?: No Weight loss?: No Fatigue?: No  Skin Skin rash/lesions?: No Itching?: No  Eyes Blurred vision?: No Double vision?: No  Ears/Nose/Throat Sore throat?: No Sinus problems?: No  Hematologic/Lymphatic Swollen glands?: No Easy bruising?: No  Cardiovascular Leg swelling?: No Chest pain?: No  Respiratory Cough?: No Shortness of breath?: No  Endocrine Excessive thirst?: No  Musculoskeletal Back pain?: No Joint pain?:  No  Neurological Headaches?: No Dizziness?: No  Psychologic Depression?: No Anxiety?: No  Physical Exam: BP 138/82   Pulse 75   Ht 5\' 8"  (1.727 m)   Wt 186 lb (84.4 kg)   BMI 28.28 kg/m   Constitutional:  Alert and oriented, No acute distress. HEENT: [place] AT, moist mucus membranes.  Trachea midline, no masses. Cardiovascular: No clubbing, cyanosis, or edema. Respiratory: Normal respiratory effort, no increased work of breathing. Skin: No rashes, bruises or suspicious lesions. Neurologic: Grossly intact, no focal deficits, moving all 4 extremities. Psychiatric: Normal mood and affect.  Laboratory Data: Lab Results  Component Value  Date   WBC 13.6 (H) 12/18/2017   HGB 12.9 (L) 12/18/2017   HCT 37.3 (L) 12/18/2017   MCV 92.0 12/18/2017   PLT 232 12/18/2017    Lab Results  Component Value Date   CREATININE 0.92 12/18/2017    Lab Results  Component Value Date   PSA <0.01 06/18/2016   PSA <0.01 12/24/2015   PSA 0.11 07/03/2014    Lab Results  Component Value Date   HGBA1C 6.1 11/21/2017     Pertinent Imaging: Results for orders placed during the hospital encounter of 12/16/17  CT RENAL STONE STUDY   Narrative CLINICAL DATA:  Right flank pain and dysuria over the past 2 days. History of diverticulosis, gastric ulcers and prostate cancer.  EXAM: CT ABDOMEN AND PELVIS WITHOUT CONTRAST  TECHNIQUE: Multidetector CT imaging of the abdomen and pelvis was performed following the standard protocol without IV contrast.  COMPARISON:  06/12/2015 CT AP  FINDINGS: Lower chest: Borderline cardiomegaly without pericardial effusion. Clear lung bases. No effusion, dominant mass or pulmonary consolidation.  Hepatobiliary: Scattered punctate calcifications consistent with old granulomatous disease. No biliary dilatation. Physiologically distended gallbladder without stones.  Pancreas: No mass, ductal dilatation or inflammation.  Spleen: Small splenules are noted in  the left upper quadrant.  Adrenals/Urinary Tract: 4 x 2 x 2 mm distal right ureteral stone with associated mild hydroureteronephrosis, partially obstructing as a there is mild dilatation of the remaining right ureteral segment fifth proximal to the UVJ. Right-sided nephromegaly and perinephric fat stranding is seen secondary to the obstructing calculus. No nephrolithiasis nor hydroureteronephrosis is seen on the left. The urinary bladder is unremarkable. No adrenal mass.  Stomach/Bowel: Increased fecal retention throughout the colon consistent constipation with circular muscle hypertrophy and extensive diverticulosis along the descending sigmoid colon. Scattered sk surgical staples project within the right hemiabdomen and upper quadrant possibly associated with patient's history of perforated ulcer repair.  Vascular/Lymphatic: Moderate aortoiliac atherosclerosis. No lymphadenopathy by CT size criteria.  Reproductive: Status post prostatectomy.  Other: No free air nor free fluid. Small fat containing inguinal canals bilaterally.  Musculoskeletal: 5.8 x 4.1 x 9.1 cm right rectus femoris lipoma, series 2/86 and sagittal image 27. No aggressive osseous appearing lesions. Lumbar spondylosis.  IMPRESSION: 1. 4 x 2 x 2 mm distal right ureteral stone with associated mild right-sided hydroureteronephrosis. 2. No evidence of metastatic disease.  Status post prostatectomy. 3. Significant stool retention within the colon consistent constipation. 4. Hepatic granulomas.   Electronically Signed   By: Jacaden Forbush Royalty M.D.   On: 12/16/2017 18:58    CT scan was personally reviewed today.  No additional stones.  Assessment & Plan:    1. Prostate cancer [facility]) Personal history of prostate cancer status post prostatectomy with biochemical recurrence status post salvage radiation with Lupron x1 year PSA remains under excellent control, undetectable We will continue with annual PSA in our  office alternating with Dr. Baruch Gouty - PSA; Future  2. Erectile dysfunction following radical prostatectomy We discussed this again today including the option of intracavernosal injections now that his libido has improved At this point, he would like to defer any treatment but may consider in the future  3. Ureteral stone Recent episode of a 4 mm right UVJ stone which presumably passed No further flank pain after his second ED visit back in 12/2017 Statistically, is highly likely that his stone did indeed pass spontaneously although he did not see it No indication for further imaging at this time No additional upper tract stones  on most recent CT scan We discussed general stone prevention techniques including drinking plenty water with goal of producing 2.5 L urine daily, increased citric acid intake, avoidance of high oxalate containing foods, and decreased salt intake.  Information about dietary recommendations given today.   Return in about 1 year (around 03/22/2019), or PSA.  Hollice Espy, MD  [facility] [address] [place], [place] [address] [phone]  I spent 25 min with this patient of which greater than 50% was spent in counseling and coordination of care with the patient.

## 2018-03-22 DIAGNOSIS — F4320 Adjustment disorder, unspecified: Secondary | ICD-10-CM | POA: Diagnosis not present

## 2018-06-17 ENCOUNTER — Encounter: Payer: Self-pay | Admitting: Internal Medicine

## 2018-06-21 ENCOUNTER — Other Ambulatory Visit: Payer: Self-pay | Admitting: Family

## 2018-06-21 ENCOUNTER — Encounter: Payer: Self-pay | Admitting: Internal Medicine

## 2018-06-21 DIAGNOSIS — M4805 Spinal stenosis, thoracolumbar region: Secondary | ICD-10-CM

## 2018-06-21 DIAGNOSIS — M2550 Pain in unspecified joint: Secondary | ICD-10-CM

## 2018-06-21 MED ORDER — MELOXICAM 7.5 MG PO TABS: 7.5000 mg | ORAL_TABLET | Freq: Every day | ORAL | 1 refills | Status: DC

## 2018-06-25 ENCOUNTER — Other Ambulatory Visit: Payer: Self-pay | Admitting: Internal Medicine

## 2018-06-25 DIAGNOSIS — M4805 Spinal stenosis, thoracolumbar region: Secondary | ICD-10-CM

## 2018-06-25 DIAGNOSIS — M2550 Pain in unspecified joint: Secondary | ICD-10-CM

## 2018-06-25 MED ORDER — MELOXICAM 7.5 MG PO TABS: 7.5000 mg | ORAL_TABLET | Freq: Every day | ORAL | 0 refills | Status: DC | PRN

## 2018-06-27 ENCOUNTER — Other Ambulatory Visit: Payer: Self-pay | Admitting: Internal Medicine

## 2018-06-27 DIAGNOSIS — M1990 Unspecified osteoarthritis, unspecified site: Secondary | ICD-10-CM

## 2018-06-27 MED ORDER — DICLOFENAC SODIUM 1 % TD GEL: 2.0000 g | Freq: Four times a day (QID) | TRANSDERMAL | 11 refills | Status: DC

## 2018-07-28 ENCOUNTER — Other Ambulatory Visit: Payer: Self-pay | Admitting: Internal Medicine

## 2018-07-28 ENCOUNTER — Encounter: Payer: Self-pay | Admitting: Internal Medicine

## 2018-07-28 DIAGNOSIS — L309 Dermatitis, unspecified: Secondary | ICD-10-CM

## 2018-07-28 MED ORDER — TRIAMCINOLONE ACETONIDE 0.1 % EX CREA: 1.0000 "application " | TOPICAL_CREAM | Freq: Two times a day (BID) | CUTANEOUS | 11 refills | Status: DC

## 2018-08-28 DIAGNOSIS — Z20828 Contact with and (suspected) exposure to other viral communicable diseases: Secondary | ICD-10-CM | POA: Diagnosis not present

## 2018-09-15 DIAGNOSIS — Z20828 Contact with and (suspected) exposure to other viral communicable diseases: Secondary | ICD-10-CM | POA: Diagnosis not present

## 2018-10-25 ENCOUNTER — Ambulatory Visit (INDEPENDENT_AMBULATORY_CARE_PROVIDER_SITE_OTHER): Payer: PPO | Admitting: Internal Medicine

## 2018-10-25 ENCOUNTER — Other Ambulatory Visit: Payer: Self-pay

## 2018-10-25 ENCOUNTER — Ambulatory Visit: Payer: PPO

## 2018-10-25 ENCOUNTER — Ambulatory Visit (INDEPENDENT_AMBULATORY_CARE_PROVIDER_SITE_OTHER): Payer: PPO

## 2018-10-25 DIAGNOSIS — Z0000 Encounter for general adult medical examination without abnormal findings: Secondary | ICD-10-CM

## 2018-10-25 DIAGNOSIS — E785 Hyperlipidemia, unspecified: Secondary | ICD-10-CM | POA: Diagnosis not present

## 2018-10-25 DIAGNOSIS — Z8546 Personal history of malignant neoplasm of prostate: Secondary | ICD-10-CM

## 2018-10-25 DIAGNOSIS — Z13220 Encounter for screening for lipoid disorders: Secondary | ICD-10-CM

## 2018-10-25 DIAGNOSIS — M542 Cervicalgia: Secondary | ICD-10-CM

## 2018-10-25 DIAGNOSIS — Z1329 Encounter for screening for other suspected endocrine disorder: Secondary | ICD-10-CM

## 2018-10-25 DIAGNOSIS — Z9081 Acquired absence of spleen: Secondary | ICD-10-CM | POA: Diagnosis not present

## 2018-10-25 DIAGNOSIS — M4805 Spinal stenosis, thoracolumbar region: Secondary | ICD-10-CM

## 2018-10-25 DIAGNOSIS — M898X1 Other specified disorders of bone, shoulder: Secondary | ICD-10-CM

## 2018-10-25 DIAGNOSIS — M25512 Pain in left shoulder: Secondary | ICD-10-CM | POA: Diagnosis not present

## 2018-10-25 DIAGNOSIS — M2550 Pain in unspecified joint: Secondary | ICD-10-CM

## 2018-10-25 DIAGNOSIS — R7303 Prediabetes: Secondary | ICD-10-CM

## 2018-10-25 MED ORDER — TRAMADOL HCL 50 MG PO TABS: 50.0000 mg | ORAL_TABLET | Freq: Two times a day (BID) | ORAL | 0 refills | Status: AC | PRN

## 2018-10-25 MED ORDER — MELOXICAM 7.5 MG PO TABS: 7.5000 mg | ORAL_TABLET | Freq: Two times a day (BID) | ORAL | 1 refills | Status: DC | PRN

## 2018-10-25 NOTE — Progress Notes (Signed)
Telephone Note  I connected with Danise Mina  on 10/25/18 at 10:15 AM EDT by telephone and verified that I am speaking with the correct person using two identifiers.  Location patient: work [employer] Persons participating in the virtual visit: patient, provider  I discussed the limitations of evaluation and management by telemedicine and the availability of in person appointments. The patient expressed understanding and agreed to proceed.   HPI: 1. C/o neck pain x 10 days tripped on concrete at work and scraped right knee w/o knee pain. Having pain with ROM worse right and left daily pain 6/10 chronic but does get worse than this tried instaflex day 6/14, out of mobic, tried voltaren gel w/o relief. He has reduced ROM as well and then after fall grandsons buttocks hit him in the forehead and since having worsening neck pain and left collar bone pain feels like iron metal stabbing pain. Straightening neck helps    ROS: See pertinent positives and negatives per HPI.  Past Medical History:  Diagnosis Date  . Amputation of finger, left    5th finger   . Arthritis    hands, back lumbar sacral   . Cancer ([place]) 2006   Prostate cancer with resection radical prostate removal. Urologist Dr. Hollice Espy locally; prostate ca dx'ed in 2007 and 2015   . Carotid artery stenosis    07/26/16 US carotid b/l 1-39%   . Cataract    s/p surgery b/l Dr. Oralia Manis [facility] 2018  . Depression   . Diverticulosis   . Erectile dysfunction   . Gastric ulcer   . GERD (gastroesophageal reflux disease)   . Hepatic steatosis   . History of splenectomy   . Hyperlipidemia   . Kidney stone   . Spinal stenosis   . Spinal stenosis at L4-L5 level   . Thoracic aortic atherosclerosis ([place])   . Urinary stress incontinence, male     Past Surgical History:  Procedure Laterality Date  . INCONTINENCE SURGERY    . KNEE ARTHROSCOPY N/A 1969   meniscal tear. left   . PROSTATECTOMY  2007   removal of prostate  . REPAIR OF PERFORATED ULCER     2009  . SPLENECTOMY  1953   after trauma, fell out tree as kid  . VENTRAL HERNIA REPAIR      Family History  Problem Relation Age of Onset  . Dementia Mother   . Glaucoma Mother   . Alcohol abuse Father   . Glaucoma Sister   . Heart disease Brother        heart valve issue  . Multiple sclerosis Brother   . Kidney disease Neg Hx   . Prostate cancer Neg Hx     SOCIAL HX: married wants to move to [facility] in 2022 age 73 with sister   Current Outpatient Medications:  .  acetaminophen (TYLENOL) 500 MG tablet, Take 1 tablet (500 mg total) by mouth every 4 (four) hours as needed., Disp: 90 tablet, Rfl: 3 .  aspirin EC 81 MG tablet, Take 81 mg by mouth daily., Disp: , Rfl:  .  betamethasone dipropionate (DIPROLENE) 0.05 % cream, Apply topically as needed. Reported on 06/03/2015, Disp: , Rfl:  .  calcium carbonate (OS-CAL) 600 MG TABS tablet, Take 600 mg by mouth 2 (two) times daily with a meal., Disp: , Rfl:  .  diclofenac sodium (VOLTAREN) 1 % GEL, Apply 2 g topically 4 (four) times daily. Hands and 4 grams qid prn lower body,  Disp: 100 g, Rfl: 11 .  DULoxetine (CYMBALTA) 60 MG capsule, TAKE 1 CAPSULE BY MOUTH ONCE DAILY, Disp: 90 capsule, Rfl: 3 .  ferrous sulfate 325 (65 FE) MG EC tablet, Take 325 mg by mouth 3 (three) times daily with meals., Disp: , Rfl:  .  IRON PO, Take by mouth., Disp: , Rfl:  .  meloxicam (MOBIC) 7.5 MG tablet, Take 1 tablet (7.5 mg total) by mouth 2 (two) times daily as needed for pain., Disp: 90 tablet, Rfl: 1 .  Multiple Vitamin (MULTIVITAMIN) capsule, Take 1 capsule by mouth daily., Disp: , Rfl:  .  naproxen (NAPROSYN) 500 MG tablet, Take 1 tablet (500 mg total) by mouth 2 (two) times daily with a meal., Disp: 20 tablet, Rfl: 2 .  triamcinolone cream (KENALOG) 0.1 %, Apply 1 application topically 2 (two) times daily. Prn leg rash, Disp: 80 g, Rfl: 11 .  valACYclovir (VALTREX) 1000 MG tablet, Take 1  tablet (1,000 mg total) by mouth 2 (two) times daily as needed., Disp: 30 tablet, Rfl: 1 .  traMADol (ULTRAM) 50 MG tablet, Take 1 tablet (50 mg total) by mouth every 12 (twelve) hours as needed for up to 5 days., Disp: 10 tablet, Rfl: 0  EXAM:  VITALS per patient if applicable:  GENERAL: alert, oriented, appears well and in no acute distress  HEENT: atraumatic, conjunttiva clear, no obvious abnormalities on inspection of external nose and ears  NECK: normal movements of the head and neck  LUNGS: on inspection no signs of respiratory distress, breathing rate appears normal, no obvious gross SOB, gasping or wheezing  CV: no obvious cyanosis  MS: moves all visible extremities without noticeable abnormality  PSYCH/NEURO: pleasant and cooperative, no obvious depression or anxiety, speech and thought processing grossly intact  ASSESSMENT AND PLAN:  Discussed the following assessment and plan:  Cervicalgia - Plan: DG Cervical Spine Complete, meloxicam (MOBIC) 7.5 MG tablet bid prn, traMADol (ULTRAM) 50 MG tablet qhs prn   Collar bone pain - Plan:Clavicle Left   Hyperlipidemia, unspecified hyperlipidemia type - Plan: Hemoglobin A1c, lipid  History of prostate cancer - f/u urology Dr. Erlene Quan 03/2019  HM Check fasting labs   Had flu shot today high dose  Tdap 2015 Had prevnar and pna 23 shingrix Rxhas not had yet   Need to get colonoscopy record just have nl per report in chartgiven release of recordspt signed again today to get from [place] -sent 2nd request   Fuurology. Rad onc apptsh/o prostate cancer PSA neg 12/2017 due to f/u 03/2019 dermatology established with [facility].  Hep C neg 04/08/17   Former smoker quit in 1980s smoked x 6-7 years no FH lung cancer smoked 1 ppd     I discussed the assessment and treatment plan with the patient. The patient was provided an opportunity to ask questions and all were answered. The patient agreed with the plan and  demonstrated an understanding of the instructions.   The patient was advised to call back or seek an in-person evaluation if the symptoms worsen or if the condition fails to improve as anticipated.  Time spent 25 minutes  Delorise Jackson, MD

## 2018-10-25 NOTE — Patient Instructions (Signed)
Cervical Sprain  A cervical sprain is a stretch or tear in one or more of the tough, cord-like tissues that connect bones (ligaments) in the neck. Cervical sprains can range from mild to severe. Severe cervical sprains can cause the spinal bones (vertebrae) in the neck to be unstable. This can lead to spinal cord damage and can result in serious nervous system problems. The amount of time that it takes for a cervical sprain to get better depends on the cause and extent of the injury. Most cervical sprains heal in 4-6 weeks. What are the causes? Cervical sprains may be caused by an injury (trauma), such as from a motor vehicle accident, a fall, or sudden forward and backward whipping movement of the head and neck (whiplash injury). Mild cervical sprains may be caused by wear and tear over time, such as from poor posture, sitting in a chair that does not provide support, or looking up or down for long periods of time. What increases the risk? The following factors may make you more likely to develop this condition:  Participating in activities that have a high risk of trauma to the neck. These include contact sports, auto racing, gymnastics, and diving.  Taking risks when driving or riding in a motor vehicle, such as speeding.  Having osteoarthritis of the spine.  Having poor strength and flexibility of the neck.  A previous neck injury.  Having poor posture.  Spending a lot of time in certain positions that put stress on the neck, such as sitting at a computer for long periods of time. What are the signs or symptoms? Symptoms of this condition include:  Pain, soreness, stiffness, tenderness, swelling, or a burning sensation in the front, back, or sides of the neck.  Sudden tightening of neck muscles that you cannot control (muscle spasms).  Pain in the shoulders or upper back.  Limited ability to move the neck.  Headache.  Dizziness.  Nausea.  Vomiting.  Weakness, numbness,  or tingling in a hand or an arm. Symptoms may develop right away after injury, or they may develop over a few days. In some cases, symptoms may go away with treatment and return (recur) over time. How is this diagnosed? This condition may be diagnosed based on:  Your medical history.  Your symptoms.  Any recent injuries or known neck problems that you have, such as arthritis in the neck.  A physical exam.  Imaging tests, such as: ? X-rays. ? MRI. ? CT scan. How is this treated? This condition is treated by resting and icing the injured area and doing physical therapy exercises. Depending on the severity of your condition, treatment may also include:  Keeping your neck in place (immobilized) for periods of time. This may be done using: ? A cervical collar. This supports your chin and the back of your head. ? A cervical traction device. This is a sling that holds up your head. This removes weight and pressure from your neck, and it may help to relieve pain.  Medicines that help to relieve pain and inflammation.  Medicines that help to relax your muscles (muscle relaxants).  Surgery. This is rare. Follow these instructions at home: If you have a cervical collar:   Wear it as told by your health care provider. Do not remove the collar unless instructed by your health care provider.  Ask your health care provider before you make any adjustments to your collar.  If you have long hair, keep it outside of the   collar.  Ask your health care provider if you can remove the collar for cleaning and bathing. If you are allowed to remove the collar for cleaning or bathing: ? Follow instructions from your health care provider about how to remove the collar safely. ? Clean the collar by wiping it with mild soap and water and drying it completely. ? If your collar has removable pads, remove them every 1-2 days and wash them by hand with soap and water. Let them air-dry completely before you  put them back in the collar. ? Check your skin under the collar for irritation or sores. If you see any, tell your health care provider. Managing pain, stiffness, and swelling   If directed, use a cervical traction device as told by your health care provider.  If directed, apply heat to the affected area before you do your physical therapy or as often as told by your health care provider. Use the heat source that your health care provider recommends, such as a moist heat pack or a heating pad. ? Place a towel between your skin and the heat source. ? Leave the heat on for 20-30 minutes. ? Remove the heat if your skin turns bright red. This is especially important if you are unable to feel pain, heat, or cold. You may have a greater risk of getting burned.  If directed, put ice on the affected area: ? Put ice in a plastic bag. ? Place a towel between your skin and the bag. ? Leave the ice on for 20 minutes, 2-3 times a day. Activity  Do not drive while wearing a cervical collar. If you do not have a cervical collar, ask your health care provider if it is safe to drive while your neck heals.  Do not drive or use heavy machinery while taking prescription pain medicine or muscle relaxants, unless your health care provider approves.  Do not lift anything that is heavier than 10 lb (4.5 kg) until your health care provider tells you that it is safe.  Rest as directed by your health care provider. Avoid positions and activities that make your symptoms worse. Ask your health care provider what activities are safe for you.  If physical therapy was prescribed, do exercises as told by your health care provider or physical therapist. General instructions  Take over-the-counter and prescription medicines only as told by your health care provider.  Do not use any products that contain nicotine or tobacco, such as cigarettes and e-cigarettes. These can delay healing. If you need help quitting, ask your  health care provider.  Keep all follow-up visits as told by your health care provider or physical therapist. This is important. How is this prevented? To prevent a cervical sprain from happening again:  Use and maintain good posture. Make any needed adjustments to your workstation to help you use good posture.  Exercise regularly as directed by your health care provider or physical therapist.  Avoid risky activities that may cause a cervical sprain. Contact a health care provider if:  You have symptoms that get worse or do not get better after 2 weeks of treatment.  You have pain that gets worse or does not get better with medicine.  You develop new, unexplained symptoms.  You have sores or irritated skin on your neck from wearing your cervical collar. Get help right away if:  You have severe pain.  You develop numbness, tingling, or weakness in any part of your body.  You cannot move   a part of your body (you have paralysis).  You have neck pain along with: ? Severe dizziness. ? Headache. Summary  A cervical sprain is a stretch or tear in one or more of the tough, cord-like tissues that connect bones (ligaments) in the neck.  Cervical sprains may be caused by an injury (trauma), such as from a motor vehicle accident, a fall, or sudden forward and backward whipping movement of the head and neck (whiplash injury).  Symptoms may develop right away after injury, or they may develop over a few days.  This condition is treated by resting and icing the injured area and doing physical therapy exercises. This information is not intended to replace advice given to you by your health care provider. Make sure you discuss any questions you have with your health care provider. Document Released: 01/24/2007 Document Revised: 07/19/2018 Document Reviewed: 11/26/2015 Elsevier Patient Education  2020 [employer].  Neck Exercises Ask your health care provider which exercises are safe for  you. Do exercises exactly as told by your health care provider and adjust them as directed. It is normal to feel mild stretching, pulling, tightness, or discomfort as you do these exercises. Stop right away if you feel sudden pain or your pain gets worse. Do not begin these exercises until told by your health care provider. Neck exercises can be important for many reasons. They can improve strength and maintain flexibility in your neck, which will help your upper back and prevent neck pain. Stretching exercises Rotation neck stretching  1. Sit in a chair or stand up. 2. Place your feet flat on the floor, shoulder width apart. 3. Slowly turn your head (rotate) to the right until a slight stretch is felt. Turn it all the way to the right so you can look over your right shoulder. Do not tilt or tip your head. 4. Hold this position for 10-30 seconds. 5. Slowly turn your head (rotate) to the left until a slight stretch is felt. Turn it all the way to the left so you can look over your left shoulder. Do not tilt or tip your head. 6. Hold this position for 10-30 seconds. Repeat __________ times. Complete this exercise __________ times a day. Neck retraction 1. Sit in a sturdy chair or stand up. 2. Look straight ahead. Do not bend your neck. 3. Use your fingers to push your chin backward (retraction). Do not bend your neck for this movement. Continue to face straight ahead. If you are doing the exercise properly, you will feel a slight sensation in your throat and a stretch at the back of your neck. 4. Hold the stretch for 1-2 seconds. Repeat __________ times. Complete this exercise __________ times a day. Strengthening exercises Neck press 1. Lie on your back on a firm bed or on the floor with a pillow under your head. 2. Use your neck muscles to push your head down on the pillow and straighten your spine. 3. Hold the position as well as you can. Keep your head facing up (in a neutral position) and  your chin tucked. 4. Slowly count to 5 while holding this position. Repeat __________ times. Complete this exercise __________ times a day. Isometrics These are exercises in which you strengthen the muscles in your neck while keeping your neck still (isometrics). 1. Sit in a supportive chair and place your hand on your forehead. 2. Keep your head and face facing straight ahead. Do not flex or extend your neck while doing isometrics. 3.  Push forward with your head and neck while pushing back with your hand. Hold for 10 seconds. 4. Do the sequence again, this time putting your hand against the back of your head. Use your head and neck to push backward against the hand pressure. 5. Finally, do the same exercise on either side of your head, pushing sideways against the pressure of your hand. Repeat __________ times. Complete this exercise __________ times a day. Prone head lifts 1. Lie face-down (prone position), resting on your elbows so that your chest and upper back are raised. 2. Start with your head facing downward, near your chest. Position your chin either on or near your chest. 3. Slowly lift your head upward. Lift until you are looking straight ahead. Then continue lifting your head as far back as you can comfortably stretch. 4. Hold your head up for 5 seconds. Then slowly lower it to your starting position. Repeat __________ times. Complete this exercise __________ times a day. Supine head lifts 1. Lie on your back (supine position), bending your knees to point to the ceiling and keeping your feet flat on the floor. 2. Lift your head slowly off the floor, raising your chin toward your chest. 3. Hold for 5 seconds. Repeat __________ times. Complete this exercise __________ times a day. Scapular retraction 1. Stand with your arms at your sides. Look straight ahead. 2. Slowly pull both shoulders (scapulae) backward and downward (retraction) until you feel a stretch between your shoulder  blades in your upper back. 3. Hold for 10-30 seconds. 4. Relax and repeat. Repeat __________ times. Complete this exercise __________ times a day. Contact a health care provider if:  Your neck pain or discomfort gets much worse when you do an exercise.  Your neck pain or discomfort does not improve within 2 hours after you exercise. If you have any of these problems, stop exercising right away. Do not do the exercises again unless your health care provider says that you can. Get help right away if:  You develop sudden, severe neck pain. If this happens, stop exercising right away. Do not do the exercises again unless your health care provider says that you can. This information is not intended to replace advice given to you by your health care provider. Make sure you discuss any questions you have with your health care provider. Document Released: 03/10/2015 Document Revised: 01/25/2018 Document Reviewed: 01/25/2018 Elsevier Patient Education  2020 [employer].

## 2018-11-06 ENCOUNTER — Encounter: Payer: Self-pay | Admitting: Internal Medicine

## 2018-11-06 NOTE — Addendum Note (Signed)
Addended by: Orland Mustard on: 11/06/2018 03:12 PM   Modules accepted: Orders

## 2018-11-07 DIAGNOSIS — M4805 Spinal stenosis, thoracolumbar region: Secondary | ICD-10-CM | POA: Diagnosis not present

## 2018-11-07 DIAGNOSIS — Z1329 Encounter for screening for other suspected endocrine disorder: Secondary | ICD-10-CM | POA: Diagnosis not present

## 2018-11-07 DIAGNOSIS — M542 Cervicalgia: Secondary | ICD-10-CM | POA: Diagnosis not present

## 2018-11-07 DIAGNOSIS — Z9081 Acquired absence of spleen: Secondary | ICD-10-CM | POA: Diagnosis not present

## 2018-11-07 DIAGNOSIS — R7303 Prediabetes: Secondary | ICD-10-CM | POA: Diagnosis not present

## 2018-11-07 DIAGNOSIS — Z0000 Encounter for general adult medical examination without abnormal findings: Secondary | ICD-10-CM | POA: Diagnosis not present

## 2018-11-07 DIAGNOSIS — M2550 Pain in unspecified joint: Secondary | ICD-10-CM | POA: Diagnosis not present

## 2018-11-07 DIAGNOSIS — Z8546 Personal history of malignant neoplasm of prostate: Secondary | ICD-10-CM | POA: Diagnosis not present

## 2018-11-07 DIAGNOSIS — Z13220 Encounter for screening for lipoid disorders: Secondary | ICD-10-CM | POA: Diagnosis not present

## 2018-11-07 DIAGNOSIS — M898X1 Other specified disorders of bone, shoulder: Secondary | ICD-10-CM | POA: Diagnosis not present

## 2018-11-08 LAB — CBC WITH DIFFERENTIAL/PLATELET
Basophils Absolute: 0 10*3/uL (ref 0.0–0.2)
Basos: 1 %
EOS (ABSOLUTE): 0.4 10*3/uL (ref 0.0–0.4)
Eos: 6 %
Hematocrit: 41.1 % (ref 37.5–51.0)
Hemoglobin: 13.6 g/dL (ref 13.0–17.7)
Immature Grans (Abs): 0 10*3/uL (ref 0.0–0.1)
Immature Granulocytes: 1 %
Lymphocytes Absolute: 2 10*3/uL (ref 0.7–3.1)
Lymphs: 31 %
MCH: 31.3 pg (ref 26.6–33.0)
MCHC: 33.1 g/dL (ref 31.5–35.7)
MCV: 95 fL (ref 79–97)
Monocytes Absolute: 0.6 10*3/uL (ref 0.1–0.9)
Monocytes: 9 %
Neutrophils Absolute: 3.5 10*3/uL (ref 1.4–7.0)
Neutrophils: 52 %
Platelets: 239 10*3/uL (ref 150–450)
RBC: 4.34 x10E6/uL (ref 4.14–5.80)
RDW: 12.5 % (ref 11.6–15.4)
WBC: 6.5 10*3/uL (ref 3.4–10.8)

## 2018-11-08 LAB — COMPREHENSIVE METABOLIC PANEL
ALT: 19 IU/L (ref 0–44)
AST: 24 IU/L (ref 0–40)
Albumin/Globulin Ratio: 2.3 — ABNORMAL HIGH (ref 1.2–2.2)
Albumin: 4.4 g/dL (ref 3.7–4.7)
Alkaline Phosphatase: 76 IU/L (ref 39–117)
BUN/Creatinine Ratio: 27 — ABNORMAL HIGH (ref 10–24)
BUN: 22 mg/dL (ref 8–27)
Bilirubin Total: 0.5 mg/dL (ref 0.0–1.2)
CO2: 23 mmol/L (ref 20–29)
Calcium: 9.9 mg/dL (ref 8.6–10.2)
Chloride: 102 mmol/L (ref 96–106)
Creatinine, Ser: 0.83 mg/dL (ref 0.76–1.27)
GFR calc Af Amer: 101 mL/min/{1.73_m2} (ref >59)
GFR calc non Af Amer: 87 mL/min/{1.73_m2} (ref >59)
Globulin, Total: 1.9 g/dL (ref 1.5–4.5)
Glucose: 101 mg/dL — ABNORMAL HIGH (ref 65–99)
Potassium: 4.9 mmol/L (ref 3.5–5.2)
Sodium: 142 mmol/L (ref 134–144)
Total Protein: 6.3 g/dL (ref 6.0–8.5)

## 2018-11-08 LAB — HEMOGLOBIN A1C
Est. average glucose Bld gHb Est-mCnc: 114 mg/dL
Hgb A1c MFr Bld: 5.6 % (ref 4.8–5.6)

## 2018-11-08 LAB — LIPID PANEL
Chol/HDL Ratio: 3.1 ratio (ref 0.0–5.0)
Cholesterol, Total: 213 mg/dL — ABNORMAL HIGH (ref 100–199)
HDL: 69 mg/dL (ref >39)
LDL Calculated: 122 mg/dL — ABNORMAL HIGH (ref 0–99)
Triglycerides: 108 mg/dL (ref 0–149)
VLDL Cholesterol Cal: 22 mg/dL (ref 5–40)

## 2018-11-08 LAB — TSH: TSH: 1.5 u[IU]/mL (ref 0.450–4.500)

## 2018-11-09 ENCOUNTER — Encounter: Payer: Self-pay | Admitting: Internal Medicine

## 2018-11-10 ENCOUNTER — Other Ambulatory Visit: Payer: Self-pay | Admitting: Internal Medicine

## 2018-11-10 DIAGNOSIS — M898X1 Other specified disorders of bone, shoulder: Secondary | ICD-10-CM

## 2018-11-10 DIAGNOSIS — M47812 Spondylosis without myelopathy or radiculopathy, cervical region: Secondary | ICD-10-CM

## 2018-11-10 DIAGNOSIS — G8929 Other chronic pain: Secondary | ICD-10-CM

## 2018-11-10 DIAGNOSIS — M25512 Pain in left shoulder: Secondary | ICD-10-CM

## 2018-11-13 ENCOUNTER — Ambulatory Visit
Admission: RE | Admit: 2018-11-13 | Discharge: 2018-11-13 | Disposition: A | Discharge status: Home / Self Care | Payer: PPO | Source: Ambulatory Visit | Attending: Internal Medicine | Admitting: Internal Medicine

## 2018-11-13 ENCOUNTER — Telehealth: Payer: Self-pay | Admitting: Internal Medicine

## 2018-11-13 ENCOUNTER — Encounter: Payer: Self-pay | Admitting: Internal Medicine

## 2018-11-13 ENCOUNTER — Other Ambulatory Visit: Payer: Self-pay

## 2018-11-13 DIAGNOSIS — G8929 Other chronic pain: Secondary | ICD-10-CM | POA: Diagnosis not present

## 2018-11-13 DIAGNOSIS — M19012 Primary osteoarthritis, left shoulder: Secondary | ICD-10-CM | POA: Diagnosis not present

## 2018-11-13 DIAGNOSIS — M25512 Pain in left shoulder: Secondary | ICD-10-CM | POA: Insufficient documentation

## 2018-11-13 DIAGNOSIS — M898X1 Other specified disorders of bone, shoulder: Secondary | ICD-10-CM | POA: Insufficient documentation

## 2018-11-13 NOTE — Telephone Encounter (Signed)
Pt wife called and stated that fax has not been received from Dr Roland Rack office. Pt would like a call back regarding. Please see my chart message

## 2018-11-20 ENCOUNTER — Encounter: Payer: Self-pay | Admitting: Internal Medicine

## 2018-11-21 ENCOUNTER — Encounter: Payer: Self-pay | Admitting: Internal Medicine

## 2018-11-22 NOTE — Telephone Encounter (Signed)
Spouse states PCP advised to call practice and request the following medical records mentioned below, spouse would like a phone call when imaging report and CD is ready for pick up best #  [phone]

## 2018-11-23 NOTE — Telephone Encounter (Signed)
CD has been made & copy of reports attached. Wife Haematologist) aware that it has been placed up front for pickup.

## 2018-11-30 ENCOUNTER — Encounter: Payer: Self-pay | Admitting: Internal Medicine

## 2018-11-30 ENCOUNTER — Other Ambulatory Visit: Payer: Self-pay | Admitting: Internal Medicine

## 2018-11-30 DIAGNOSIS — L309 Dermatitis, unspecified: Secondary | ICD-10-CM

## 2018-11-30 MED ORDER — TRIAMCINOLONE ACETONIDE 0.1 % EX OINT: 1.0000 "application " | TOPICAL_OINTMENT | Freq: Two times a day (BID) | CUTANEOUS | 5 refills | Status: DC

## 2018-12-01 DIAGNOSIS — M47812 Spondylosis without myelopathy or radiculopathy, cervical region: Secondary | ICD-10-CM | POA: Diagnosis not present

## 2018-12-06 ENCOUNTER — Other Ambulatory Visit: Payer: Self-pay | Admitting: Internal Medicine

## 2018-12-06 MED ORDER — DULOXETINE HCL 60 MG PO CPEP: 60.0000 mg | ORAL_CAPSULE | Freq: Every day | ORAL | 3 refills | Status: DC

## 2018-12-27 ENCOUNTER — Inpatient Hospital Stay: Payer: PPO | Attending: Radiation Oncology

## 2018-12-27 DIAGNOSIS — C61 Malignant neoplasm of prostate: Secondary | ICD-10-CM | POA: Insufficient documentation

## 2019-01-01 ENCOUNTER — Other Ambulatory Visit: Payer: Self-pay | Admitting: *Deleted

## 2019-01-02 ENCOUNTER — Inpatient Hospital Stay: Payer: PPO

## 2019-01-02 ENCOUNTER — Other Ambulatory Visit: Payer: Self-pay

## 2019-01-02 DIAGNOSIS — C61 Malignant neoplasm of prostate: Secondary | ICD-10-CM | POA: Diagnosis not present

## 2019-01-02 LAB — PSA: Prostatic Specific Antigen: <0.01 ng/mL (ref 0.00–4.00)

## 2019-01-03 ENCOUNTER — Encounter: Payer: Self-pay | Admitting: Radiation Oncology

## 2019-01-03 ENCOUNTER — Ambulatory Visit
Admission: RE | Admit: 2019-01-03 | Discharge: 2019-01-03 | Disposition: A | Discharge status: Home / Self Care | Payer: PPO | Source: Ambulatory Visit | Attending: Radiation Oncology | Admitting: Radiation Oncology

## 2019-01-03 ENCOUNTER — Other Ambulatory Visit: Payer: Self-pay

## 2019-01-03 VITALS — BP 125/78 | HR 77 | Temp 97.7°F | Wt 198.3 lb

## 2019-01-03 DIAGNOSIS — C61 Malignant neoplasm of prostate: Secondary | ICD-10-CM

## 2019-01-03 DIAGNOSIS — Z923 Personal history of irradiation: Secondary | ICD-10-CM | POA: Diagnosis not present

## 2019-01-03 NOTE — Progress Notes (Signed)
Radiation Oncology Follow up Note  Name: Benjamin Mills   Date:   01/03/2019 MRN:  [id number] DOB: 03-Apr-1946    This 73 y.o. male presents to the clinic today for 3-1/2-year follow-up status post salvage radiation therapy for Gleason 7 adenocarcinoma the prostate.  REFERRING PROVIDER: McLean-Scocuzza, Olivia Mackie *  HPI: Patient is a 73 year old male now about 3-1/2 years having completed salvage radiation therapy to his prostate status post prostatectomy in 2007 with biochemical failure.  He is seen today in routine follow-up and is doing well.  He specifically denies any increased lower urinary tract symptoms diarrhea or fatigue.  His most recent PSA is stable over the past year at less than 0.01.Marland Kitchen  COMPLICATIONS OF TREATMENT: none  FOLLOW UP COMPLIANCE: keeps appointments   PHYSICAL EXAM:  BP 125/78   Pulse 77   Temp (P) 97.7 F (36.5 C) (Tympanic)   Wt 198 lb 4.8 oz (89.9 kg)   BMI 30.15 kg/m  Well-developed well-nourished patient in NAD. HEENT reveals PERLA, EOMI, discs not visualized.  Oral cavity is clear. No oral mucosal lesions are identified. Neck is clear without evidence of cervical or supraclavicular adenopathy. Lungs are clear to A&P. Cardiac examination is essentially unremarkable with regular rate and rhythm without murmur rub or thrill. Abdomen is benign with no organomegaly or masses noted. Motor sensory and DTR levels are equal and symmetric in the upper and lower extremities. Cranial nerves II through XII are grossly intact. Proprioception is intact. No peripheral adenopathy or edema is identified. No motor or sensory levels are noted. Crude visual fields are within normal range.  RADIOLOGY RESULTS: No current films for review  PLAN: Present time patient is now 3-1/2 years out under excellent biochemical control.  He continues close follow-up care and PSAs through urology.  I am going to turn follow-up care over to urology at this time.  Patient knows to call me with any  concerns at any time I am pleased with his overall excellent biochemical result with salvage radiation.  I would like to take this opportunity to thank you for allowing me to participate in the care of your patient.Noreene Filbert, MD

## 2019-01-30 ENCOUNTER — Other Ambulatory Visit: Payer: Self-pay

## 2019-01-30 ENCOUNTER — Encounter: Payer: Self-pay | Admitting: Internal Medicine

## 2019-01-30 ENCOUNTER — Ambulatory Visit (INDEPENDENT_AMBULATORY_CARE_PROVIDER_SITE_OTHER): Payer: PPO | Admitting: Internal Medicine

## 2019-01-30 VITALS — BP 120/70 | HR 85 | Temp 97.7°F | Ht 68.0 in | Wt 198.2 lb

## 2019-01-30 DIAGNOSIS — H6123 Impacted cerumen, bilateral: Secondary | ICD-10-CM

## 2019-01-30 DIAGNOSIS — M1990 Unspecified osteoarthritis, unspecified site: Secondary | ICD-10-CM | POA: Diagnosis not present

## 2019-01-30 DIAGNOSIS — E785 Hyperlipidemia, unspecified: Secondary | ICD-10-CM | POA: Diagnosis not present

## 2019-01-30 DIAGNOSIS — F4323 Adjustment disorder with mixed anxiety and depressed mood: Secondary | ICD-10-CM | POA: Diagnosis not present

## 2019-01-30 DIAGNOSIS — M48061 Spinal stenosis, lumbar region without neurogenic claudication: Secondary | ICD-10-CM | POA: Diagnosis not present

## 2019-01-30 DIAGNOSIS — M5416 Radiculopathy, lumbar region: Secondary | ICD-10-CM

## 2019-01-30 DIAGNOSIS — Z8546 Personal history of malignant neoplasm of prostate: Secondary | ICD-10-CM

## 2019-01-30 DIAGNOSIS — M47817 Spondylosis without myelopathy or radiculopathy, lumbosacral region: Secondary | ICD-10-CM

## 2019-01-30 HISTORY — DX: Impacted cerumen, bilateral: H61.23

## 2019-01-30 NOTE — Progress Notes (Signed)
Chief Complaint  Patient presents with  . Follow-up   F/u  1.prostate cancer in remission PSA <0.01 01/02/19 f/u urology 03/2019  2. Arthritis pain neck, right wrist and thumb and low back no need further work up for now takes mobic prn  3. Reviewed labs 10/2018 elevated BUN/Cr ration make sure drinking enough water  4. HLD walking on weekends and trying to watch diet    Review of Systems  Constitutional: Negative for weight loss.  HENT: Negative for hearing loss.   Eyes: Negative for blurred vision.  Respiratory: Negative for shortness of breath.   Cardiovascular: Negative for chest pain.  Gastrointestinal: Negative for abdominal pain.  Musculoskeletal: Positive for back pain, joint pain and neck pain. Negative for falls.  Skin: Negative for rash.  Neurological: Negative for headaches.  Psychiatric/Behavioral: Negative for depression.   Past Medical History:  Diagnosis Date  . Amputation of finger, left    5th finger   . Arthritis    hands, back lumbar sacral   . Cancer ([place]) 2006   Prostate cancer with resection radical prostate removal. Urologist Dr. Hollice Espy locally; prostate ca dx'ed in 2007 and 2015   . Carotid artery stenosis    07/26/16 US carotid b/l 1-39%   . Cataract    s/p surgery b/l Dr. Oralia Manis [facility] 2018  . Depression   . Diverticulosis   . Erectile dysfunction   . Gastric ulcer   . GERD (gastroesophageal reflux disease)   . Hepatic steatosis   . History of splenectomy   . Hyperlipidemia   . Kidney stone   . Spinal stenosis   . Spinal stenosis at L4-L5 level   . Thoracic aortic atherosclerosis ([place])   . Urinary stress incontinence, male    Past Surgical History:  Procedure Laterality Date  . INCONTINENCE SURGERY    . KNEE ARTHROSCOPY N/A 1969   meniscal tear. left   . PROSTATECTOMY  2007   removal of prostate  . REPAIR OF PERFORATED ULCER     2009  . SPLENECTOMY  1953   after trauma, fell out tree as kid  . VENTRAL HERNIA REPAIR      Family History  Problem Relation Age of Onset  . Dementia Mother   . Glaucoma Mother   . Alcohol abuse Father   . Glaucoma Sister   . Heart disease Brother        heart valve issue  . Multiple sclerosis Brother   . Kidney disease Neg Hx   . Prostate cancer Neg Hx    Social History   Socioeconomic History  . Marital status: Married    Spouse name: Not on file  . Number of children: Not on file  . Years of education: Not on file  . Highest education level: Not on file  Occupational History  . Not on file  Social Needs  . Financial resource strain: Not on file  . Food insecurity    Worry: Not on file    Inability: Not on file  . Transportation needs    Medical: Not on file    Non-medical: Not on file  Tobacco Use  . Smoking status: Former Smoker    Quit date: 06/05/1978    Years since quitting: 40.6  . Smokeless tobacco: Never Used  . Tobacco comment: quit in 1980s duration 6-7 years 1 ppd no FH lung cancer   Substance and Sexual Activity  . Alcohol use: Yes    Alcohol/week: 0.0 standard drinks  Comment: OCC  . Drug use: No  . Sexual activity: Not Currently  Lifestyle  . Physical activity    Days per week: Not on file    Minutes per session: Not on file  . Stress: Not on file  Relationships  . Social Herbalist on phone: Not on file    Gets together: Not on file    Attends religious service: Not on file    Active member of club or organization: Not on file    Attends meetings of clubs or organizations: Not on file    Relationship status: Not on file  . Intimate partner violence    Fear of current or ex partner: Not on file    Emotionally abused: Not on file    Physically abused: Not on file    Forced sexual activity: Not on file  Other Topics Concern  . Not on file  Social History Narrative   Born in [place].   Lived in [place].    Moved to [facility], brother lives in [place] another [place]       Has 2 brothers and 1 sister.      Lives with  wife in [place]. Has 3 daughters.      Work - retired, Surveyor, quantity      Diet - regular      Exercise - walks occasionally   Current Meds  Medication Sig  . acetaminophen (TYLENOL) 500 MG tablet Take 1 tablet (500 mg total) by mouth every 4 (four) hours as needed.  Marland Kitchen aspirin EC 81 MG tablet Take 81 mg by mouth daily.  . betamethasone dipropionate (DIPROLENE) 0.05 % cream Apply topically as needed. Reported on 06/03/2015  . calcium carbonate (OS-CAL) 600 MG TABS tablet Take 600 mg by mouth 2 (two) times daily with a meal.  . DULoxetine (CYMBALTA) 60 MG capsule Take 1 capsule (60 mg total) by mouth daily.  . ferrous sulfate 325 (65 FE) MG EC tablet Take 325 mg by mouth 3 (three) times daily with meals.  . IRON PO Take by mouth.  . meloxicam (MOBIC) 7.5 MG tablet Take 1 tablet (7.5 mg total) by mouth 2 (two) times daily as needed for pain.  . Multiple Vitamin (MULTIVITAMIN) capsule Take 1 capsule by mouth daily.  Marland Kitchen triamcinolone ointment (KENALOG) 0.1 % Apply 1 application topically 2 (two) times daily. Rash to leg  . valACYclovir (VALTREX) 1000 MG tablet Take 1 tablet (1,000 mg total) by mouth 2 (two) times daily as needed.  . [DISCONTINUED] diclofenac sodium (VOLTAREN) 1 % GEL Apply 2 g topically 4 (four) times daily. Hands and 4 grams qid prn lower body  . [DISCONTINUED] naproxen (NAPROSYN) 500 MG tablet Take 1 tablet (500 mg total) by mouth 2 (two) times daily with a meal.   No Known Allergies Recent Results (from the past 2160 hour(s))  Comprehensive metabolic panel     Status: Abnormal   Collection Time: 11/07/18  7:06 AM  Result Value Ref Range   Glucose 101 (H) 65 - 99 mg/dL   BUN 22 8 - 27 mg/dL   Creatinine, Ser 0.83 0.76 - 1.27 mg/dL   GFR calc non Af Amer 87 >59 mL/min/1.73   GFR calc Af Amer 101 >59 mL/min/1.73   BUN/Creatinine Ratio 27 (H) 10 - 24   Sodium 142 134 - 144 mmol/L   Potassium 4.9 3.5 - 5.2 mmol/L   Chloride 102 96 - 106 mmol/L   CO2 23 20 - 29  mmol/L    Calcium 9.9 8.6 - 10.2 mg/dL   Total Protein 6.3 6.0 - 8.5 g/dL   Albumin 4.4 3.7 - 4.7 g/dL   Globulin, Total 1.9 1.5 - 4.5 g/dL   Albumin/Globulin Ratio 2.3 (H) 1.2 - 2.2   Bilirubin Total 0.5 0.0 - 1.2 mg/dL   Alkaline Phosphatase 76 39 - 117 IU/L   AST 24 0 - 40 IU/L   ALT 19 0 - 44 IU/L  Lipid panel     Status: Abnormal   Collection Time: 11/07/18  7:06 AM  Result Value Ref Range   Cholesterol, Total 213 (H) 100 - 199 mg/dL   Triglycerides 108 0 - 149 mg/dL   HDL 69 >39 mg/dL   VLDL Cholesterol Cal 22 5 - 40 mg/dL   LDL Calculated 122 (H) 0 - 99 mg/dL   Chol/HDL Ratio 3.1 0.0 - 5.0 ratio    Comment:                                   T. Chol/HDL Ratio                                             Men  Women                               1/2 Avg.Risk  3.4    3.3                                   Avg.Risk  5.0    4.4                                2X Avg.Risk  9.6    7.1                                3X Avg.Risk 23.4   11.0   CBC with Differential/Platelet     Status: None   Collection Time: 11/07/18  7:06 AM  Result Value Ref Range   WBC 6.5 3.4 - 10.8 x10E3/uL   RBC 4.34 4.14 - 5.80 x10E6/uL   Hemoglobin 13.6 13.0 - 17.7 g/dL   Hematocrit 41.1 37.5 - 51.0 %   MCV 95 79 - 97 fL   MCH 31.3 26.6 - 33.0 pg   MCHC 33.1 31.5 - 35.7 g/dL   RDW 12.5 11.6 - 15.4 %   Platelets 239 150 - 450 x10E3/uL   Neutrophils 52 Not Estab. %   Lymphs 31 Not Estab. %   Monocytes 9 Not Estab. %   Eos 6 Not Estab. %   Basos 1 Not Estab. %   Neutrophils Absolute 3.5 1.4 - 7.0 x10E3/uL   Lymphocytes Absolute 2.0 0.7 - 3.1 x10E3/uL   Monocytes Absolute 0.6 0.1 - 0.9 x10E3/uL   EOS (ABSOLUTE) 0.4 0.0 - 0.4 x10E3/uL   Basophils Absolute 0.0 0.0 - 0.2 x10E3/uL   Immature Granulocytes 1 Not Estab. %   Immature Grans (Abs) 0.0 0.0 - 0.1 x10E3/uL  TSH     Status: None  Collection Time: 11/07/18  7:06 AM  Result Value Ref Range   TSH 1.500 0.450 - 4.500 uIU/mL  Hemoglobin A1c     Status: None    Collection Time: 11/07/18  7:06 AM  Result Value Ref Range   Hgb A1c MFr Bld 5.6 4.8 - 5.6 %    Comment:          Prediabetes: 5.7 - 6.4          Diabetes: >6.4          Glycemic control for adults with diabetes: <7.0    Est. average glucose Bld gHb Est-mCnc 114 mg/dL  PSA     Status: None   Collection Time: 01/02/19  9:14 AM  Result Value Ref Range   Prostatic Specific Antigen <0.01 0.00 - 4.00 ng/mL    Comment: (NOTE) While PSA levels of <=4.0 ng/ml are reported as reference range, some men with levels below 4.0 ng/ml can have prostate cancer and many men with PSA above 4.0 ng/ml do not have prostate cancer.  Other tests such as free PSA, age specific reference ranges, PSA velocity and PSA doubling time may be helpful especially in men less than 64 years old. Performed at [facility], [place] [address] [place], [place] [address]    Objective  Body mass index is 30.14 kg/m. Wt Readings from Last 3 Encounters:  01/30/19 198 lb 3.2 oz (89.9 kg)  01/03/19 198 lb 4.8 oz (89.9 kg)  03/21/18 186 lb (84.4 kg)   Temp Readings from Last 3 Encounters:  01/30/19 97.7 F (36.5 C) (Skin)  01/03/19 97.7 F (36.5 C) ((P) Tympanic)  02/17/18 97.9 F (36.6 C) (Oral)   BP Readings from Last 3 Encounters:  01/30/19 120/70  01/03/19 125/78  03/21/18 138/82   Pulse Readings from Last 3 Encounters:  01/30/19 85  01/03/19 77  03/21/18 75    Physical Exam Vitals signs and nursing note reviewed.  Constitutional:      Appearance: Normal appearance. He is well-developed and well-groomed. He is obese.     Comments: +mask on    HENT:     Head: Normocephalic and atraumatic.  Eyes:     Conjunctiva/sclera: Conjunctivae normal.     Pupils: Pupils are equal, round, and reactive to light.  Cardiovascular:     Rate and Rhythm: Normal rate and regular rhythm.     Heart sounds: Normal heart sounds. No murmur.  Pulmonary:     Effort: Pulmonary effort is normal.     Breath sounds:  Normal breath sounds.  Abdominal:     Tenderness: There is no abdominal tenderness.  Skin:    General: Skin is warm and dry.  Neurological:     General: No focal deficit present.     Mental Status: He is alert and oriented to person, place, and time. Mental status is at baseline.     Gait: Gait normal.  Psychiatric:        Attention and Perception: Attention and perception normal.        Mood and Affect: Mood and affect normal.        Speech: Speech normal.        Behavior: Behavior normal. Behavior is cooperative.        Thought Content: Thought content normal.        Cognition and Memory: Cognition and memory normal.        Judgment: Judgment normal.   cerumen impaction b/l   Assessment  Plan  Hyperlipidemia, unspecified hyperlipidemia type rec increase exercise healthy diet  Given info  History of prostate cancer f/u urology 03/2019  Adjustment disorder with mixed anxiety and depressed mood On cymbalta doing well   Spondylosis of lumbosacral region, unspecified spinal osteoarthritis complication status Spinal stenosis at L4-L5 level Lumbar radiculopathy Arthritis F/u Dr. Lynann Bologna and poggi prn  Bilateral impacted cerumen  Removed with currette and tolerated b/l   HM Had flu shot utd  Tdap 2015 Had prevnar and pna 23 shingrix Rxhas not had yet given tx today   Need to get colonoscopy record just have nl per report in chartgiven release of recordspt signed again today to get from [place] -sent 3rd request  Fuurology. Rad onc apptsh/o prostate cancerPSA neg 9/2019due to f/u 03/2019 dermatology established with [address]not seen 2020   Hep C neg 04/08/17   Former smoker quit in 1980s smoked x 6-7 years no FH lung cancer smoked 1 ppd  Provider: Dr. Olivia Mackie McLean-Scocuzza-Internal Medicine

## 2019-01-30 NOTE — Patient Instructions (Addendum)
Debrox ear wax drops    Tumeric capsules and glucosamine chrondroitin for joints  Results for IMRI, DELGARDO (MRN [id number]) as of 01/30/2019 09:58  Ref. Range 11/07/2018 07:06  Cholesterol, Total Latest Ref Range: 100 - 199 mg/dL 213 (H)  HDL Cholesterol Latest Ref Range: >39 mg/dL 69  LDL (calc) Latest Ref Range: 0 - 99 mg/dL 122 (H)  Triglycerides Latest Ref Range: 0 - 149 mg/dL 108  VLDL Cholesterol Cal Latest Ref Range: 5 - 40 mg/dL 22   Consider 1 hour walks on Saturday and Sunday  Goal total cholesterol <200  LDL bad cholesterol closer to 100 or below   Lidocaine or Salsonpas patches for pain     cervical traction unit for home use online and use it regularly.  High Cholesterol  High cholesterol is a condition in which the blood has high levels of a white, waxy, fat-like substance (cholesterol). The human body needs small amounts of cholesterol. The liver makes all the cholesterol that the body needs. Extra (excess) cholesterol comes from the food that we eat. Cholesterol is carried from the liver by the blood through the blood vessels. If you have high cholesterol, deposits (plaques) may build up on the walls of your blood vessels (arteries). Plaques make the arteries narrower and stiffer. Cholesterol plaques increase your risk for heart attack and stroke. Work with your health care provider to keep your cholesterol levels in a healthy range. What increases the risk? This condition is more likely to develop in people who:  Eat foods that are high in animal fat (saturated fat) or cholesterol.  Are overweight.  Are not getting enough exercise.  Have a family history of high cholesterol. What are the signs or symptoms? There are no symptoms of this condition. How is this diagnosed? This condition may be diagnosed from the results of a blood test.  If you are older than age 69, your health care provider may check your cholesterol every 4-6 years.  You may be checked  more often if you already have high cholesterol or other risk factors for heart disease. The blood test for cholesterol measures:  "Bad" cholesterol (LDL cholesterol). This is the main type of cholesterol that causes heart disease. The desired level for LDL is less than 100.  "Good" cholesterol (HDL cholesterol). This type helps to protect against heart disease by cleaning the arteries and carrying the LDL away. The desired level for HDL is 60 or higher.  Triglycerides. These are fats that the body can store or burn for energy. The desired number for triglycerides is lower than 150.  Total cholesterol. This is a measure of the total amount of cholesterol in your blood, including LDL cholesterol, HDL cholesterol, and triglycerides. A healthy number is less than 200. How is this treated? This condition is treated with diet changes, lifestyle changes, and medicines. Diet changes  This may include eating more whole grains, fruits, vegetables, nuts, and fish.  This may also include cutting back on red meat and foods that have a lot of added sugar. Lifestyle changes  Changes may include getting at least 40 minutes of aerobic exercise 3 times a week. Aerobic exercises include walking, biking, and swimming. Aerobic exercise along with a healthy diet can help you maintain a healthy weight.  Changes may also include quitting smoking. Medicines  Medicines are usually given if diet and lifestyle changes have failed to reduce your cholesterol to healthy levels.  Your health care provider may prescribe a statin medicine.  Statin medicines have been shown to reduce cholesterol, which can reduce the risk of heart disease. Follow these instructions at home: Eating and drinking If told by your health care provider:  Eat chicken (without skin), fish, veal, shellfish, ground [place] breast, and round or loin cuts of red meat.  Do not eat fried foods or fatty meats, such as hot dogs and salami.  Eat  plenty of fruits, such as apples.  Eat plenty of vegetables, such as broccoli, potatoes, and carrots.  Eat beans, peas, and lentils.  Eat grains such as barley, rice, couscous, and bulgur wheat.  Eat pasta without cream sauces.  Use skim or nonfat milk, and eat low-fat or nonfat yogurt and cheeses.  Do not eat or drink whole milk, cream, ice cream, egg yolks, or hard cheeses.  Do not eat stick margarine or tub margarines that contain trans fats (also called partially hydrogenated oils).  Do not eat saturated tropical oils, such as coconut oil and palm oil.  Do not eat cakes, cookies, crackers, or other baked goods that contain trans fats.  General instructions  Exercise as directed by your health care provider. Increase your activity level with activities such as gardening, walking, and taking the stairs.  Take over-the-counter and prescription medicines only as told by your health care provider.  Do not use any products that contain nicotine or tobacco, such as cigarettes and e-cigarettes. If you need help quitting, ask your health care provider.  Keep all follow-up visits as told by your health care provider. This is important. Contact a health care provider if:  You are struggling to maintain a healthy diet or weight.  You need help to start on an exercise program.  You need help to stop smoking. Get help right away if:  You have chest pain.  You have trouble breathing. This information is not intended to replace advice given to you by your health care provider. Make sure you discuss any questions you have with your health care provider. Document Released: 03/29/2005 Document Revised: 04/01/2017 Document Reviewed: 09/27/2015 Elsevier Patient Education  [place].  Cholesterol Content in Foods Cholesterol is a waxy, fat-like substance that helps to carry fat in the blood. The body needs cholesterol in small amounts, but too much cholesterol can cause damage to  the arteries and heart. Most people should eat less than 200 milligrams (mg) of cholesterol a day. Foods with cholesterol  Cholesterol is found in animal-based foods, such as meat, seafood, and dairy. Generally, low-fat dairy and lean meats have less cholesterol than full-fat dairy and fatty meats. The milligrams of cholesterol per serving (mg per serving) of common cholesterol-containing foods are listed below. Meat and other proteins  Egg - one large whole egg has 186 mg.  Veal shank - 4 oz has 141 mg.  Lean ground [place] (93% lean) - 4 oz has 118 mg.  Fat-trimmed lamb loin - 4 oz has 106 mg.  Lean ground beef (90% lean) - 4 oz has 100 mg.  Lobster - 3.5 oz has 90 mg.  Pork loin chops - 4 oz has 86 mg.  Canned salmon - 3.5 oz has 83 mg.  Fat-trimmed beef top loin - 4 oz has 78 mg.  Frankfurter - 1 frank (3.5 oz) has 77 mg.  Crab - 3.5 oz has 71 mg.  Roasted chicken without skin, white meat - 4 oz has 66 mg.  Light bologna - 2 oz has 45 mg.  Deli-cut [place] - 2 oz  has 31 mg.  Canned tuna - 3.5 oz has 31 mg.  Bacon - 1 oz has 29 mg.  Oysters and mussels (raw) - 3.5 oz has 25 mg.  Mackerel - 1 oz has 22 mg.  Trout - 1 oz has 20 mg.  Pork sausage - 1 link (1 oz) has 17 mg.  Salmon - 1 oz has 16 mg.  Tilapia - 1 oz has 14 mg. Dairy  Soft-serve ice cream -  cup (4 oz) has 103 mg.  Whole-milk yogurt - 1 cup (8 oz) has 29 mg.  Cheddar cheese - 1 oz has 28 mg.  American cheese - 1 oz has 28 mg.  Whole milk - 1 cup (8 oz) has 23 mg.  2% milk - 1 cup (8 oz) has 18 mg.  Cream cheese - 1 tablespoon (Tbsp) has 15 mg.  Cottage cheese -  cup (4 oz) has 14 mg.  Low-fat (1%) milk - 1 cup (8 oz) has 10 mg.  Sour cream - 1 Tbsp has 8.5 mg.  Low-fat yogurt - 1 cup (8 oz) has 8 mg.  Nonfat Greek yogurt - 1 cup (8 oz) has 7 mg.  Half-and-half cream - 1 Tbsp has 5 mg. Fats and oils  Cod liver oil - 1 tablespoon (Tbsp) has 82 mg.  Butter - 1 Tbsp has 15  mg.  Lard - 1 Tbsp has 14 mg.  Bacon grease - 1 Tbsp has 14 mg.  Mayonnaise - 1 Tbsp has 5-10 mg.  Margarine - 1 Tbsp has 3-10 mg. Exact amounts of cholesterol in these foods may vary depending on specific ingredients and brands. Foods without cholesterol Most plant-based foods do not have cholesterol unless you combine them with a food that has cholesterol. Foods without cholesterol include:  Grains and cereals.  Vegetables.  Fruits.  Vegetable oils, such as olive, canola, and sunflower oil.  Legumes, such as peas, beans, and lentils.  Nuts and seeds.  Egg whites. Summary  The body needs cholesterol in small amounts, but too much cholesterol can cause damage to the arteries and heart.  Most people should eat less than 200 milligrams (mg) of cholesterol a day. This information is not intended to replace advice given to you by your health care provider. Make sure you discuss any questions you have with your health care provider. Document Released: 11/23/2016 Document Revised: 03/11/2017 Document Reviewed: 11/23/2016 Elsevier Patient Education  2020 [employer].

## 2019-02-02 ENCOUNTER — Encounter: Payer: Self-pay | Admitting: Internal Medicine

## 2019-02-02 ENCOUNTER — Other Ambulatory Visit: Payer: Self-pay | Admitting: Internal Medicine

## 2019-02-26 DIAGNOSIS — M1812 Unilateral primary osteoarthritis of first carpometacarpal joint, left hand: Secondary | ICD-10-CM | POA: Diagnosis not present

## 2019-02-26 DIAGNOSIS — M79644 Pain in right finger(s): Secondary | ICD-10-CM | POA: Diagnosis not present

## 2019-02-26 DIAGNOSIS — M79645 Pain in left finger(s): Secondary | ICD-10-CM | POA: Diagnosis not present

## 2019-02-26 DIAGNOSIS — M19049 Primary osteoarthritis, unspecified hand: Secondary | ICD-10-CM

## 2019-02-26 DIAGNOSIS — M1811 Unilateral primary osteoarthritis of first carpometacarpal joint, right hand: Secondary | ICD-10-CM | POA: Diagnosis not present

## 2019-02-26 HISTORY — DX: Primary osteoarthritis, unspecified hand: M19.049

## 2019-02-27 ENCOUNTER — Other Ambulatory Visit: Payer: Self-pay | Admitting: Internal Medicine

## 2019-02-27 DIAGNOSIS — M4805 Spinal stenosis, thoracolumbar region: Secondary | ICD-10-CM

## 2019-02-27 DIAGNOSIS — M2550 Pain in unspecified joint: Secondary | ICD-10-CM

## 2019-02-27 DIAGNOSIS — M542 Cervicalgia: Secondary | ICD-10-CM

## 2019-02-27 MED ORDER — MELOXICAM 7.5 MG PO TABS: 7.5000 mg | ORAL_TABLET | Freq: Two times a day (BID) | ORAL | 1 refills | Status: DC | PRN

## 2019-03-22 ENCOUNTER — Other Ambulatory Visit: Payer: PPO

## 2019-03-23 ENCOUNTER — Other Ambulatory Visit: Payer: PPO

## 2019-03-23 ENCOUNTER — Other Ambulatory Visit
Admission: RE | Admit: 2019-03-23 | Discharge: 2019-03-23 | Disposition: A | Discharge status: Home / Self Care | Payer: PPO | Source: Ambulatory Visit | Attending: Urology | Admitting: Urology

## 2019-03-23 ENCOUNTER — Encounter: Payer: Self-pay | Admitting: Urology

## 2019-03-23 ENCOUNTER — Other Ambulatory Visit: Payer: Self-pay

## 2019-03-23 DIAGNOSIS — C61 Malignant neoplasm of prostate: Secondary | ICD-10-CM | POA: Insufficient documentation

## 2019-03-23 LAB — PSA: Prostatic Specific Antigen: <0.01 ng/mL (ref 0.00–4.00)

## 2019-03-27 ENCOUNTER — Ambulatory Visit: Payer: PPO | Admitting: Urology

## 2019-04-17 ENCOUNTER — Encounter: Payer: Self-pay | Admitting: Internal Medicine

## 2019-05-11 ENCOUNTER — Telehealth: Payer: Self-pay | Admitting: Internal Medicine

## 2019-05-11 DIAGNOSIS — M25562 Pain in left knee: Secondary | ICD-10-CM | POA: Diagnosis not present

## 2019-05-11 DIAGNOSIS — M1712 Unilateral primary osteoarthritis, left knee: Secondary | ICD-10-CM | POA: Insufficient documentation

## 2019-05-11 HISTORY — DX: Unilateral primary osteoarthritis, left knee: M17.12

## 2019-05-11 NOTE — Telephone Encounter (Signed)
Left message for patient to call back and schedule Medicare Annual Wellness Visit (AWV) either virtually or audio only.  Last AWV 12.28.17; please schedule at anytime with Denisa O'Brien-Blaney at [facility].

## 2019-05-24 ENCOUNTER — Ambulatory Visit: Payer: PPO | Attending: Internal Medicine

## 2019-05-24 DIAGNOSIS — Z23 Encounter for immunization: Secondary | ICD-10-CM | POA: Insufficient documentation

## 2019-05-24 NOTE — Progress Notes (Signed)
   Covid-19 Vaccination Clinic  Name:  Benjamin Mills    MRN: [id number] DOB: [birth year]  05/24/2019  [name] was observed post Covid-19 immunization for 15 minutes without incidence. He was provided with Vaccine Information Sheet and instruction to access the V-Safe system.   [name] was instructed to call 911 with any severe reactions post vaccine: Marland Kitchen Difficulty breathing  . Swelling of your face and throat  . A fast heartbeat  . A bad rash all over your body  . Dizziness and weakness    Immunizations Administered    Name Date Dose VIS Date Route   Pfizer COVID-19 Vaccine 05/24/2019 11:35 AM 0.3 mL 03/23/2019 Intramuscular   Manufacturer: [place]   Lot: [id number]   [place]: [id number]

## 2019-05-25 ENCOUNTER — Ambulatory Visit: Payer: PPO

## 2019-06-04 DIAGNOSIS — M1811 Unilateral primary osteoarthritis of first carpometacarpal joint, right hand: Secondary | ICD-10-CM | POA: Diagnosis not present

## 2019-06-04 DIAGNOSIS — M1812 Unilateral primary osteoarthritis of first carpometacarpal joint, left hand: Secondary | ICD-10-CM | POA: Diagnosis not present

## 2019-06-05 ENCOUNTER — Other Ambulatory Visit: Payer: Self-pay | Admitting: Internal Medicine

## 2019-06-05 DIAGNOSIS — M4805 Spinal stenosis, thoracolumbar region: Secondary | ICD-10-CM

## 2019-06-05 DIAGNOSIS — M2550 Pain in unspecified joint: Secondary | ICD-10-CM

## 2019-06-05 DIAGNOSIS — M542 Cervicalgia: Secondary | ICD-10-CM

## 2019-06-05 MED ORDER — MELOXICAM 7.5 MG PO TABS: 7.5000 mg | ORAL_TABLET | Freq: Two times a day (BID) | ORAL | 1 refills | Status: DC | PRN

## 2019-06-20 ENCOUNTER — Ambulatory Visit: Payer: PPO | Attending: Internal Medicine

## 2019-06-20 DIAGNOSIS — Z23 Encounter for immunization: Secondary | ICD-10-CM | POA: Insufficient documentation

## 2019-06-20 NOTE — Progress Notes (Signed)
   Covid-19 Vaccination Clinic  Name:  Benjamin Mills    MRN: [id number] DOB: [birth year]  06/20/2019  [name] was observed post Covid-19 immunization for 15 minutes without incident. He was provided with Vaccine Information Sheet and instruction to access the V-Safe system.   [name] was instructed to call 911 with any severe reactions post vaccine: Marland Kitchen Difficulty breathing  . Swelling of face and throat  . A fast heartbeat  . A bad rash all over body  . Dizziness and weakness   Immunizations Administered    Name Date Dose VIS Date Route   Pfizer COVID-19 Vaccine 06/20/2019  8:14 AM 0.3 mL 03/23/2019 Intramuscular   Manufacturer: [place]   Lot: [id number]   [place]: [id number]

## 2019-07-03 DIAGNOSIS — D2271 Melanocytic nevi of right lower limb, including hip: Secondary | ICD-10-CM | POA: Diagnosis not present

## 2019-07-03 DIAGNOSIS — L821 Other seborrheic keratosis: Secondary | ICD-10-CM | POA: Diagnosis not present

## 2019-07-03 DIAGNOSIS — D2261 Melanocytic nevi of right upper limb, including shoulder: Secondary | ICD-10-CM | POA: Diagnosis not present

## 2019-07-03 DIAGNOSIS — D225 Melanocytic nevi of trunk: Secondary | ICD-10-CM | POA: Diagnosis not present

## 2019-07-03 DIAGNOSIS — D2262 Melanocytic nevi of left upper limb, including shoulder: Secondary | ICD-10-CM | POA: Diagnosis not present

## 2019-07-03 DIAGNOSIS — I872 Venous insufficiency (chronic) (peripheral): Secondary | ICD-10-CM | POA: Diagnosis not present

## 2019-07-03 DIAGNOSIS — D2272 Melanocytic nevi of left lower limb, including hip: Secondary | ICD-10-CM | POA: Diagnosis not present

## 2019-07-03 DIAGNOSIS — L738 Other specified follicular disorders: Secondary | ICD-10-CM | POA: Diagnosis not present

## 2019-07-10 ENCOUNTER — Encounter: Payer: Self-pay | Admitting: Internal Medicine

## 2019-07-24 DIAGNOSIS — F4320 Adjustment disorder, unspecified: Secondary | ICD-10-CM | POA: Diagnosis not present

## 2019-07-31 ENCOUNTER — Encounter: Payer: Self-pay | Admitting: Internal Medicine

## 2019-07-31 ENCOUNTER — Other Ambulatory Visit: Payer: Self-pay

## 2019-07-31 ENCOUNTER — Ambulatory Visit (INDEPENDENT_AMBULATORY_CARE_PROVIDER_SITE_OTHER): Payer: PPO | Admitting: Internal Medicine

## 2019-07-31 ENCOUNTER — Telehealth: Payer: Self-pay | Admitting: Internal Medicine

## 2019-07-31 VITALS — BP 130/80 | HR 69 | Temp 97.3°F | Ht 68.0 in | Wt 200.2 lb

## 2019-07-31 DIAGNOSIS — Z1389 Encounter for screening for other disorder: Secondary | ICD-10-CM | POA: Diagnosis not present

## 2019-07-31 DIAGNOSIS — Z13220 Encounter for screening for lipoid disorders: Secondary | ICD-10-CM

## 2019-07-31 DIAGNOSIS — M1990 Unspecified osteoarthritis, unspecified site: Secondary | ICD-10-CM | POA: Diagnosis not present

## 2019-07-31 DIAGNOSIS — R030 Elevated blood-pressure reading, without diagnosis of hypertension: Secondary | ICD-10-CM

## 2019-07-31 DIAGNOSIS — I6523 Occlusion and stenosis of bilateral carotid arteries: Secondary | ICD-10-CM

## 2019-07-31 HISTORY — DX: Occlusion and stenosis of bilateral carotid arteries: I65.23

## 2019-07-31 LAB — CBC WITH DIFFERENTIAL/PLATELET
Basophils Absolute: 0 10*3/uL (ref 0.0–0.1)
Basophils Relative: 0.9 % (ref 0.0–3.0)
Eosinophils Absolute: 0.3 10*3/uL (ref 0.0–0.7)
Eosinophils Relative: 5.7 % — ABNORMAL HIGH (ref 0.0–5.0)
HCT: 40.3 % (ref 39.0–52.0)
Hemoglobin: 13.4 g/dL (ref 13.0–17.0)
Lymphocytes Relative: 23 % (ref 12.0–46.0)
Lymphs Abs: 1.2 10*3/uL (ref 0.7–4.0)
MCHC: 33.3 g/dL (ref 30.0–36.0)
MCV: 94.8 fl (ref 78.0–100.0)
Monocytes Absolute: 0.5 10*3/uL (ref 0.1–1.0)
Monocytes Relative: 9.1 % (ref 3.0–12.0)
Neutro Abs: 3.3 10*3/uL (ref 1.4–7.7)
Neutrophils Relative %: 61.3 % (ref 43.0–77.0)
Platelets: 251 10*3/uL (ref 150.0–400.0)
RBC: 4.25 Mil/uL (ref 4.22–5.81)
RDW: 13.4 % (ref 11.5–15.5)
WBC: 5.3 10*3/uL (ref 4.0–10.5)

## 2019-07-31 LAB — LIPID PANEL
Cholesterol: 208 mg/dL — ABNORMAL HIGH (ref 0–200)
HDL: 68.2 mg/dL (ref >39.00)
LDL Cholesterol: 126 mg/dL — ABNORMAL HIGH (ref 0–99)
NonHDL: 139.71
Total CHOL/HDL Ratio: 3
Triglycerides: 69 mg/dL (ref 0.0–149.0)
VLDL: 13.8 mg/dL (ref 0.0–40.0)

## 2019-07-31 LAB — COMPREHENSIVE METABOLIC PANEL
ALT: 16 U/L (ref 0–53)
AST: 21 U/L (ref 0–37)
Albumin: 4.1 g/dL (ref 3.5–5.2)
Alkaline Phosphatase: 69 U/L (ref 39–117)
BUN: 16 mg/dL (ref 6–23)
CO2: 29 mEq/L (ref 19–32)
Calcium: 9.2 mg/dL (ref 8.4–10.5)
Chloride: 104 mEq/L (ref 96–112)
Creatinine, Ser: 0.73 mg/dL (ref 0.40–1.50)
GFR: 104.95 mL/min (ref >60.00)
Glucose, Bld: 95 mg/dL (ref 70–99)
Potassium: 4.3 mEq/L (ref 3.5–5.1)
Sodium: 138 mEq/L (ref 135–145)
Total Bilirubin: 0.6 mg/dL (ref 0.2–1.2)
Total Protein: 6.5 g/dL (ref 6.0–8.3)

## 2019-07-31 NOTE — Progress Notes (Signed)
Chief Complaint  Patient presents with  . Follow-up   F/u  1. Elevated BP repeat 130/80 no on BP meds 2.mild CAD noted 07/2016 1-39% b/l will repeat in 07/2021 3. Left knee OA needs TKR Dr. Roland Rack will do 03/2020 or 04/2020 per pt and OA b/l hands thumbs getting  4. H/o prostate cancer PSA 0.01 03/23/2019 and saw urology doing well   Review of Systems  Constitutional: Negative for weight loss.  HENT: Negative for hearing loss.   Eyes: Negative for blurred vision.  Respiratory: Negative for shortness of breath.   Cardiovascular: Negative for chest pain.  Gastrointestinal: Negative for abdominal pain.  Musculoskeletal: Positive for joint pain. Negative for falls.  Skin: Negative for rash.  Neurological: Negative for headaches.  Psychiatric/Behavioral: Negative for memory loss.   Past Medical History:  Diagnosis Date  . Amputation of finger, left    5th finger   . Arthritis    hands, back lumbar sacral   . Cancer ([place]) 2006   Prostate cancer with resection radical prostate removal. Urologist Dr. Hollice Espy locally; prostate ca dx'ed in 2007 and 2015   . Carotid artery stenosis    07/26/16 US carotid b/l 1-39%   . Cataract    s/p surgery b/l Dr. Oralia Manis [facility] 2018  . Depression   . Diverticulosis   . Erectile dysfunction   . Gastric ulcer   . GERD (gastroesophageal reflux disease)   . Hepatic steatosis   . History of splenectomy   . Hyperlipidemia   . Kidney stone   . Spinal stenosis   . Spinal stenosis at L4-L5 level   . Thoracic aortic atherosclerosis ([place])   . Urinary stress incontinence, male    Past Surgical History:  Procedure Laterality Date  . INCONTINENCE SURGERY    . KNEE ARTHROSCOPY N/A 1969   meniscal tear. left   . PROSTATECTOMY  2007   removal of prostate  . REPAIR OF PERFORATED ULCER     2009  . SPLENECTOMY  1953   after trauma, fell out tree as kid  . VENTRAL HERNIA REPAIR     Family History  Problem Relation Age of Onset  . Dementia  Mother   . Glaucoma Mother   . Alcohol abuse Father   . Glaucoma Sister   . Heart disease Brother        heart valve issue  . Multiple sclerosis Brother   . Kidney disease Neg Hx   . Prostate cancer Neg Hx    Social History   Socioeconomic History  . Marital status: Married    Spouse name: Not on file  . Number of children: Not on file  . Years of education: Not on file  . Highest education level: Not on file  Occupational History  . Not on file  Tobacco Use  . Smoking status: Former Smoker    Quit date: 06/05/1978    Years since quitting: 41.1  . Smokeless tobacco: Never Used  . Tobacco comment: quit in 1980s duration 6-7 years 1 ppd no FH lung cancer   Substance and Sexual Activity  . Alcohol use: Yes    Alcohol/week: 0.0 standard drinks    Comment: OCC  . Drug use: No  . Sexual activity: Not Currently  Other Topics Concern  . Not on file  Social History Narrative   Born in [place].   Lived in [place].    Moved to [facility], brother lives in [place] another [place]  Has 2 brothers and 1 sister.      Lives with wife in [place]. Has 3 daughters.      Work - retired, Surveyor, quantity      Diet - regular      Exercise - walks occasionally   Social Determinants of Radio broadcast assistant Strain:   . Difficulty of Paying Living Expenses:   Food Insecurity:   . Worried About Charity fundraiser in the Last Year:   . Arboriculturist in the Last Year:   Transportation Needs:   . Film/video editor (Medical):   Marland Kitchen Lack of Transportation (Non-Medical):   Physical Activity:   . Days of Exercise per Week:   . Minutes of Exercise per Session:   Stress:   . Feeling of Stress :   Social Connections:   . Frequency of Communication with Friends and Family:   . Frequency of Social Gatherings with Friends and Family:   . Attends Religious Services:   . Active Member of Clubs or Organizations:   . Attends Archivist Meetings:   Marland Kitchen Marital Status:    Intimate Partner Violence:   . Fear of Current or Ex-Partner:   . Emotionally Abused:   Marland Kitchen Physically Abused:   . Sexually Abused:    Current Meds  Medication Sig  . calcium carbonate (OS-CAL) 600 MG TABS tablet Take 600 mg by mouth 2 (two) times daily with a meal.  . DULoxetine (CYMBALTA) 60 MG capsule Take 1 capsule (60 mg total) by mouth daily.  . meloxicam (MOBIC) 7.5 MG tablet Take 1 tablet (7.5 mg total) by mouth 2 (two) times daily as needed for pain.  . Multiple Vitamin (MULTIVITAMIN) capsule Take 1 capsule by mouth daily.  Marland Kitchen triamcinolone ointment (KENALOG) 0.1 % Apply 1 application topically 2 (two) times daily. Rash to leg  . valACYclovir (VALTREX) 1000 MG tablet Take 1 tablet (1,000 mg total) by mouth 2 (two) times daily as needed.   No Known Allergies No results found for this or any previous visit (from the past 2160 hour(s)). Objective  Body mass index is 30.44 kg/m. Wt Readings from Last 3 Encounters:  07/31/19 200 lb 3.2 oz (90.8 kg)  01/30/19 198 lb 3.2 oz (89.9 kg)  01/03/19 198 lb 4.8 oz (89.9 kg)   Temp Readings from Last 3 Encounters:  07/31/19 (!) 97.3 F (36.3 C) (Temporal)  01/30/19 97.7 F (36.5 C) (Skin)  01/03/19 97.7 F (36.5 C) ((P) Tympanic)   BP Readings from Last 3 Encounters:  07/31/19 130/80  01/30/19 120/70  01/03/19 125/78   Pulse Readings from Last 3 Encounters:  07/31/19 69  01/30/19 85  01/03/19 77    Physical Exam Vitals and nursing note reviewed.  Constitutional:      Appearance: Normal appearance. He is well-developed and well-groomed. He is obese.  HENT:     Head: Normocephalic and atraumatic.  Eyes:     Conjunctiva/sclera: Conjunctivae normal.     Pupils: Pupils are equal, round, and reactive to light.  Cardiovascular:     Rate and Rhythm: Normal rate and regular rhythm.     Heart sounds: Normal heart sounds. No murmur.  Pulmonary:     Effort: Pulmonary effort is normal.     Breath sounds: Normal breath sounds.   Abdominal:     General: Abdomen is flat. Bowel sounds are normal.     Tenderness: There is no abdominal tenderness.  Skin:    General:  Skin is warm and dry.  Neurological:     General: No focal deficit present.     Mental Status: He is alert and oriented to person, place, and time. Mental status is at baseline.     Gait: Gait normal.  Psychiatric:        Attention and Perception: Attention and perception normal.        Mood and Affect: Mood and affect normal.        Speech: Speech normal.        Behavior: Behavior normal. Behavior is cooperative.        Thought Content: Thought content normal.        Cognition and Memory: Cognition and memory normal.        Judgment: Judgment normal.     Assessment  Plan  Elevated blood pressure reading - Plan: Comprehensive metabolic panel, Lipid panel, Urinalysis, Routine w reflex microscopic, CBC with Differential/Platelet  Bilateral carotid artery stenosis 1-39% b/l  F/u 07/2021 repeat   Osteoarthritis, unspecified osteoarthritis type, unspecified site  Left TKR Dr. Roland Rack will do 03/2020 or 04/2020  inj b/l thumbs Q3 months  HM Had flu shot utd  Tdap 2015 Had prevnar and pna 23 shingrix Rxhas not had yet given tx today  covid 2/2 pfizer  Need to get colonoscopy record just have nl per report in chartgiven release of recordspt signed again today to get from [place] -sent 3rd request  Fuurology. Rad onc apptsh/o prostate cancerPSA neg 9/2019due to f/u 03/2019 <0.01 negative   dermatology established with [address]not seen 2020 seen end 06/2019 or 07/2019 doing well no tx or bx   Hep C neg 04/08/17   Former smoker quit in 1980s smoked x 6-7 years no FH lung cancer smoked 1 ppd  Provider: Dr. Olivia Mackie McLean-Scocuzza-Internal Medicine

## 2019-07-31 NOTE — Telephone Encounter (Signed)
Faxed 2nd request for medical records to [facility] for last colonoscopy Fax to [phone]

## 2019-07-31 NOTE — Patient Instructions (Signed)
Consider monitoring your blood pressure blood pressure cuff from [place] goal <130/<80 if not this let me know   DASH Eating Plan DASH stands for "Dietary Approaches to Stop Hypertension." The DASH eating plan is a healthy eating plan that has been shown to reduce high blood pressure (hypertension). It may also reduce your risk for type 2 diabetes, heart disease, and stroke. The DASH eating plan may also help with weight loss. What are tips for following this plan?  General guidelines  Avoid eating more than 2,300 mg (milligrams) of salt (sodium) a day. If you have hypertension, you may need to reduce your sodium intake to 1,500 mg a day.  Limit alcohol intake to no more than 1 drink a day for nonpregnant women and 2 drinks a day for men. One drink equals 12 oz of beer, 5 oz of wine, or 1 oz of hard liquor.  Work with your health care provider to maintain a healthy body weight or to lose weight. Ask what an ideal weight is for you.  Get at least 30 minutes of exercise that causes your heart to beat faster (aerobic exercise) most days of the week. Activities may include walking, swimming, or biking.  Work with your health care provider or diet and nutrition specialist (dietitian) to adjust your eating plan to your individual calorie needs. Reading food labels   Check food labels for the amount of sodium per serving. Choose foods with less than 5 percent of the Daily Value of sodium. Generally, foods with less than 300 mg of sodium per serving fit into this eating plan.  To find whole grains, look for the word "whole" as the first word in the ingredient list. Shopping  Buy products labeled as "low-sodium" or "no salt added."  Buy fresh foods. Avoid canned foods and premade or frozen meals. Cooking  Avoid adding salt when cooking. Use salt-free seasonings or herbs instead of table salt or sea salt. Check with your health care provider or pharmacist before using salt substitutes.  Do not  fry foods. Cook foods using healthy methods such as baking, boiling, grilling, and broiling instead.  Cook with heart-healthy oils, such as olive, canola, soybean, or sunflower oil. Meal planning  Eat a balanced diet that includes: ? 5 or more servings of fruits and vegetables each day. At each meal, try to fill half of your plate with fruits and vegetables. ? Up to 6-8 servings of whole grains each day. ? Less than 6 oz of lean meat, poultry, or fish each day. A 3-oz serving of meat is about the same size as a deck of cards. One egg equals 1 oz. ? 2 servings of low-fat dairy each day. ? A serving of nuts, seeds, or beans 5 times each week. ? Heart-healthy fats. Healthy fats called Omega-3 fatty acids are found in foods such as flaxseeds and coldwater fish, like sardines, salmon, and mackerel.  Limit how much you eat of the following: ? Canned or prepackaged foods. ? Food that is high in trans fat, such as fried foods. ? Food that is high in saturated fat, such as fatty meat. ? Sweets, desserts, sugary drinks, and other foods with added sugar. ? Full-fat dairy products.  Do not salt foods before eating.  Try to eat at least 2 vegetarian meals each week.  Eat more home-cooked food and less restaurant, buffet, and fast food.  When eating at a restaurant, ask that your food be prepared with less salt or no salt,  if possible. What foods are recommended? The items listed may not be a complete list. Talk with your dietitian about what dietary choices are best for you. Grains Whole-grain or whole-wheat bread. Whole-grain or whole-wheat pasta. Brown rice. [place]. Bulgur. Whole-grain and low-sodium cereals. Pita bread. Low-fat, low-sodium crackers. Whole-wheat flour tortillas. Vegetables Fresh or frozen vegetables (raw, steamed, roasted, or grilled). Low-sodium or reduced-sodium tomato and vegetable juice. Low-sodium or reduced-sodium tomato sauce and tomato paste. Low-sodium or  reduced-sodium canned vegetables. Fruits All fresh, dried, or frozen fruit. Canned fruit in natural juice (without added sugar). Meat and other protein foods Skinless chicken or [place]. Ground chicken or [place]. Pork with fat trimmed off. Fish and seafood. Egg whites. Dried beans, peas, or lentils. Unsalted nuts, nut butters, and seeds. Unsalted canned beans. Lean cuts of beef with fat trimmed off. Low-sodium, lean deli meat. Dairy Low-fat (1%) or fat-free (skim) milk. Fat-free, low-fat, or reduced-fat cheeses. Nonfat, low-sodium ricotta or cottage cheese. Low-fat or nonfat yogurt. Low-fat, low-sodium cheese. Fats and oils Soft margarine without trans fats. Vegetable oil. Low-fat, reduced-fat, or light mayonnaise and salad dressings (reduced-sodium). Canola, safflower, olive, soybean, and sunflower oils. Avocado. Seasoning and other foods Herbs. Spices. Seasoning mixes without salt. Unsalted popcorn and pretzels. Fat-free sweets. What foods are not recommended? The items listed may not be a complete list. Talk with your dietitian about what dietary choices are best for you. Grains Baked goods made with fat, such as croissants, muffins, or some breads. Dry pasta or rice meal packs. Vegetables Creamed or fried vegetables. Vegetables in a cheese sauce. Regular canned vegetables (not low-sodium or reduced-sodium). Regular canned tomato sauce and paste (not low-sodium or reduced-sodium). Regular tomato and vegetable juice (not low-sodium or reduced-sodium). Angie Fava. Olives. Fruits Canned fruit in a light or heavy syrup. Fried fruit. Fruit in cream or butter sauce. Meat and other protein foods Fatty cuts of meat. Ribs. Fried meat. Berniece Salines. Sausage. Bologna and other processed lunch meats. Salami. Fatback. Hotdogs. Bratwurst. Salted nuts and seeds. Canned beans with added salt. Canned or smoked fish. Whole eggs or egg yolks. Chicken or [place] with skin. Dairy Whole or 2% milk, cream, and half-and-half.  Whole or full-fat cream cheese. Whole-fat or sweetened yogurt. Full-fat cheese. Nondairy creamers. Whipped toppings. Processed cheese and cheese spreads. Fats and oils Butter. Stick margarine. Lard. Shortening. Ghee. Bacon fat. Tropical oils, such as coconut, palm kernel, or palm oil. Seasoning and other foods Salted popcorn and pretzels. Onion salt, garlic salt, seasoned salt, table salt, and sea salt. Worcestershire sauce. Tartar sauce. Barbecue sauce. Teriyaki sauce. Soy sauce, including reduced-sodium. Steak sauce. Canned and packaged gravies. Fish sauce. Oyster sauce. Cocktail sauce. Horseradish that you find on the shelf. Ketchup. Mustard. Meat flavorings and tenderizers. Bouillon cubes. Hot sauce and Tabasco sauce. Premade or packaged marinades. Premade or packaged taco seasonings. Relishes. Regular salad dressings. Where to find more information:  National Heart, Lung, and [place]: https://wilson-eaton.com/  American Heart Association: www.heart.org Summary  The DASH eating plan is a healthy eating plan that has been shown to reduce high blood pressure (hypertension). It may also reduce your risk for type 2 diabetes, heart disease, and stroke.  With the DASH eating plan, you should limit salt (sodium) intake to 2,300 mg a day. If you have hypertension, you may need to reduce your sodium intake to 1,500 mg a day.  When on the DASH eating plan, aim to eat more fresh fruits and vegetables, whole grains, lean proteins, low-fat dairy, and heart-healthy fats.  Work with your health care provider or diet and nutrition specialist (dietitian) to adjust your eating plan to your individual calorie needs. This information is not intended to replace advice given to you by your health care provider. Make sure you discuss any questions you have with your health care provider. Document Revised: 03/11/2017 Document Reviewed: 03/22/2016 Elsevier Patient Education  2020 [employer].

## 2019-08-01 LAB — URINALYSIS, ROUTINE W REFLEX MICROSCOPIC
Bacteria, UA: NONE SEEN /HPF
Bilirubin Urine: NEGATIVE
Glucose, UA: NEGATIVE
Hgb urine dipstick: NEGATIVE
Hyaline Cast: NONE SEEN /LPF
Ketones, ur: NEGATIVE
Leukocytes,Ua: NEGATIVE
Nitrite: NEGATIVE
Protein, ur: NEGATIVE
Specific Gravity, Urine: 1.024 (ref 1.001–1.03)
Squamous Epithelial / HPF: NONE SEEN /HPF (ref <=5)
WBC, UA: NONE SEEN /HPF (ref 0–5)
pH: 7.5 (ref 5.0–8.0)

## 2019-08-03 DIAGNOSIS — F4320 Adjustment disorder, unspecified: Secondary | ICD-10-CM | POA: Diagnosis not present

## 2019-08-31 ENCOUNTER — Other Ambulatory Visit: Payer: Self-pay | Admitting: Internal Medicine

## 2019-08-31 DIAGNOSIS — M2550 Pain in unspecified joint: Secondary | ICD-10-CM

## 2019-08-31 DIAGNOSIS — M542 Cervicalgia: Secondary | ICD-10-CM

## 2019-08-31 DIAGNOSIS — M4805 Spinal stenosis, thoracolumbar region: Secondary | ICD-10-CM

## 2019-08-31 MED ORDER — MELOXICAM 7.5 MG PO TABS: 7.5000 mg | ORAL_TABLET | Freq: Two times a day (BID) | ORAL | 1 refills | Status: DC | PRN

## 2019-09-03 DIAGNOSIS — M1811 Unilateral primary osteoarthritis of first carpometacarpal joint, right hand: Secondary | ICD-10-CM | POA: Diagnosis not present

## 2019-09-03 DIAGNOSIS — M1812 Unilateral primary osteoarthritis of first carpometacarpal joint, left hand: Secondary | ICD-10-CM | POA: Diagnosis not present

## 2019-09-14 ENCOUNTER — Other Ambulatory Visit: Payer: Self-pay

## 2019-09-14 ENCOUNTER — Emergency Department: Payer: PPO

## 2019-09-14 DIAGNOSIS — R079 Chest pain, unspecified: Secondary | ICD-10-CM | POA: Diagnosis not present

## 2019-09-14 DIAGNOSIS — K219 Gastro-esophageal reflux disease without esophagitis: Secondary | ICD-10-CM | POA: Diagnosis not present

## 2019-09-14 DIAGNOSIS — R071 Chest pain on breathing: Secondary | ICD-10-CM | POA: Diagnosis not present

## 2019-09-14 DIAGNOSIS — X500XXA Overexertion from strenuous movement or load, initial encounter: Secondary | ICD-10-CM | POA: Diagnosis not present

## 2019-09-14 DIAGNOSIS — R0609 Other forms of dyspnea: Secondary | ICD-10-CM | POA: Insufficient documentation

## 2019-09-14 DIAGNOSIS — R0602 Shortness of breath: Secondary | ICD-10-CM | POA: Diagnosis not present

## 2019-09-14 DIAGNOSIS — R0789 Other chest pain: Secondary | ICD-10-CM | POA: Diagnosis not present

## 2019-09-14 LAB — CBC
HCT: 38.4 % — ABNORMAL LOW (ref 39.0–52.0)
Hemoglobin: 12.8 g/dL — ABNORMAL LOW (ref 13.0–17.0)
MCH: 31.4 pg (ref 26.0–34.0)
MCHC: 33.3 g/dL (ref 30.0–36.0)
MCV: 94.1 fL (ref 80.0–100.0)
Platelets: 236 10*3/uL (ref 150–400)
RBC: 4.08 MIL/uL — ABNORMAL LOW (ref 4.22–5.81)
RDW: 13.4 % (ref 11.5–15.5)
WBC: 8 10*3/uL (ref 4.0–10.5)
nRBC: 0 % (ref 0.0–0.2)

## 2019-09-14 LAB — BASIC METABOLIC PANEL
Anion gap: 9 (ref 5–15)
BUN: 21 mg/dL (ref 8–23)
CO2: 26 mmol/L (ref 22–32)
Calcium: 9.3 mg/dL (ref 8.9–10.3)
Chloride: 105 mmol/L (ref 98–111)
Creatinine, Ser: 0.93 mg/dL (ref 0.61–1.24)
GFR calc Af Amer: >60 mL/min (ref >60)
GFR calc non Af Amer: >60 mL/min (ref >60)
Glucose, Bld: 100 mg/dL — ABNORMAL HIGH (ref 70–99)
Potassium: 4.1 mmol/L (ref 3.5–5.1)
Sodium: 140 mmol/L (ref 135–145)

## 2019-09-14 LAB — CK: Total CK: 164 U/L (ref 49–397)

## 2019-09-14 LAB — TROPONIN I (HIGH SENSITIVITY)
Troponin I (High Sensitivity): 4 ng/L (ref <18)
Troponin I (High Sensitivity): 4 ng/L (ref <18)

## 2019-09-14 MED ORDER — SODIUM CHLORIDE 0.9% FLUSH: 3.0000 mL | Freq: Once | INTRAVENOUS | Status: DC

## 2019-09-14 NOTE — ED Triage Notes (Signed)
Pt reports mid to left sided chest pain that started yesterday afternoon while working. Pt reports being shob on exertion today. Pt speaking in complete sentences without difficulty. Pt in NAD, A&Ox4. Denies radiation to shoulder or back.

## 2019-09-15 ENCOUNTER — Encounter: Payer: Self-pay | Admitting: Internal Medicine

## 2019-09-15 ENCOUNTER — Emergency Department: Payer: PPO

## 2019-09-15 ENCOUNTER — Emergency Department
Admission: EM | Admit: 2019-09-15 | Discharge: 2019-09-15 | Disposition: A | Discharge status: Home / Self Care | Payer: PPO | Attending: Emergency Medicine | Admitting: Emergency Medicine

## 2019-09-15 DIAGNOSIS — R079 Chest pain, unspecified: Secondary | ICD-10-CM

## 2019-09-15 DIAGNOSIS — R0789 Other chest pain: Secondary | ICD-10-CM

## 2019-09-15 DIAGNOSIS — R0602 Shortness of breath: Secondary | ICD-10-CM | POA: Diagnosis not present

## 2019-09-15 LAB — FIBRIN DERIVATIVES D-DIMER (ARMC ONLY): Fibrin derivatives D-dimer (ARMC): 642.93 ng/mL (FEU) — ABNORMAL HIGH (ref 0.00–499.00)

## 2019-09-15 MED ORDER — IOHEXOL 350 MG/ML SOLN
75.0000 mL | Freq: Once | INTRAVENOUS | Status: AC | PRN
  Administered 2019-09-15: 75 mL via INTRAVENOUS

## 2019-09-15 MED ORDER — SODIUM CHLORIDE 0.9 % IV BOLUS
500.0000 mL | Freq: Once | INTRAVENOUS | Status: AC
  Administered 2019-09-15: 500 mL via INTRAVENOUS

## 2019-09-15 NOTE — ED Provider Notes (Signed)
[facility] Emergency Department Provider Note   ____________________________________________   First MD Initiated Contact with Patient 09/15/19 0210     (approximate)  I have reviewed the triage vital signs and the nursing notes.   HISTORY  Chief Complaint Chest Pain    HPI Benjamin Mills is a 74 y.o. male who presents to the ED from home with a chief complaint of chest pain. 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 Denies radiation. Describes pain as a soreness which is exacerbated by deep breathing and movement. Has had occasional dyspnea on exertion which is not acute. Denies fever, chills, cough, abdominal pain, nausea, vomiting, dizziness. Denies recent travel, trauma or hormone use. Denies known COVID-19 exposure. Has had both vaccinations.       Past Medical History:  Diagnosis Date  . Amputation of finger, left    5th finger   . Arthritis    hands, back lumbar sacral   . Cancer ([place]) 2006   Prostate cancer with resection radical prostate removal. Urologist Dr. Hollice Espy locally; prostate ca dx'ed in 2007 and 2015   . Carotid artery stenosis    07/26/16 US carotid b/l 1-39%   . Cataract    s/p surgery b/l Dr. Oralia [facility] 2018  . Depression   . Diverticulosis   . Erectile dysfunction   . Gastric ulcer   . GERD (gastroesophageal reflux disease)   . Hepatic steatosis   . History of splenectomy   . Hyperlipidemia   . Kidney stone   . Spinal stenosis   . Spinal stenosis at L4-L5 level   . Thoracic aortic atherosclerosis ([place])   . Urinary stress incontinence, male     Patient Active Problem List   Diagnosis Date Noted  . Bilateral carotid artery stenosis 07/31/2019  . Bilateral impacted cerumen 01/30/2019  . Cervicalgia 10/25/2018  . Kidney stone on right side 02/17/2018  . Arthralgia 02/17/2018  . Lumbar radiculopathy  11/09/2017  . Spinal stenosis at L4-L5 level 08/16/2017  . Bunion of great toe of left foot 08/16/2017  . Seborrheic keratoses 08/16/2017  . Multiple benign nevi 08/16/2017  . Spondylosis of lumbosacral region 03/30/2017  . Osteoarthritis 03/30/2017  . Benign skin lesion 11/05/2015  . SUI (stress urinary incontinence), male 12/10/2014  . Erectile dysfunction following radical prostatectomy 12/10/2014  . Adjustment disorder with mixed anxiety and depressed mood 08/06/2014  . Hyperlipidemia 06/05/2014  . History of prostate cancer 06/05/2014  . History of splenectomy 06/05/2014  . H/O cold sores 06/05/2014    Past Surgical History:  Procedure Laterality Date  . INCONTINENCE SURGERY    . KNEE ARTHROSCOPY N/A 1969   meniscal tear. left   . PROSTATECTOMY  2007   removal of prostate  . REPAIR OF PERFORATED ULCER     2009  . SPLENECTOMY  1953   after trauma, fell out tree as kid  . VENTRAL HERNIA REPAIR      Prior to Admission medications   Medication Sig Start Date End Date Taking? Authorizing Provider  betamethasone dipropionate (DIPROLENE) 0.05 % cream Apply topically as needed. Reported on 06/03/2015    [provider]  calcium carbonate (OS-CAL) 600 MG TABS tablet Take 600 mg by mouth 2 (two) times daily with a meal.    [provider]  DULoxetine (CYMBALTA) 60 MG capsule Take 1 capsule (60 mg total) by mouth daily. 12/06/18  McLean-Scocuzza, Nino Glow, MD  meloxicam (MOBIC) 7.5 MG tablet Take 1 tablet (7.5 mg total) by mouth 2 (two) times daily as needed for pain. 08/31/19   McLean-Scocuzza, Nino Glow, MD  Multiple Vitamin (MULTIVITAMIN) capsule Take 1 capsule by mouth daily.    [provider]  triamcinolone ointment (KENALOG) 0.1 % Apply 1 application topically 2 (two) times daily. Rash to leg 11/30/18   McLean-Scocuzza, Nino Glow, MD  valACYclovir (VALTREX) 1000 MG tablet Take 1 tablet (1,000 mg total) by mouth 2 (two) times daily as needed. 08/17/16   Coral Spikes, DO    Allergies Patient has no known allergies.  Family History  Problem Relation Age of Onset  . Dementia Mother   . Glaucoma Mother   . Alcohol abuse Father   . Glaucoma Sister   . Heart disease Brother        heart valve issue  . Multiple sclerosis Brother   . Kidney disease Neg Hx   . Prostate cancer Neg Hx     Social History Social History   Tobacco Use  . Smoking status: Former Smoker    Quit date: 06/05/1978    Years since quitting: 41.3  . Smokeless tobacco: Never Used  . Tobacco comment: quit in 1980s duration 6-7 years 1 ppd no FH lung cancer   Substance Use Topics  . Alcohol use: Yes    Alcohol/week: 0.0 standard drinks    Comment: OCC  . Drug use: No    Review of Systems  Constitutional: No fever/chills Eyes: No visual changes. ENT: No sore throat. Cardiovascular: Positive for chest pain. Respiratory: Denies shortness of breath. Gastrointestinal: No abdominal pain.  No nausea, no vomiting.  No diarrhea.  No constipation. Genitourinary: Negative for dysuria. Musculoskeletal: Negative for back pain. Skin: Negative for rash. Neurological: Negative for headaches, focal weakness or numbness.   ____________________________________________   PHYSICAL EXAM:  VITAL SIGNS: ED Triage Vitals  Enc Vitals Group     BP 09/14/19 1814 134/79     Pulse Rate 09/14/19 1814 87     Resp 09/14/19 1814 18     Temp 09/14/19 1814 98.3 F (36.8 C)     Temp Source 09/14/19 1814 Oral     SpO2 09/14/19 1814 96 %     Weight 09/14/19 1815 196 lb (88.9 kg)     Height 09/14/19 1815 5\' 8"  (1.727 m)     Head Circumference --      Peak Flow --      Pain Score 09/14/19 1814 4     Pain Loc --      Pain Edu? --      Excl. in [place]? --     Constitutional: Alert and oriented. Well appearing and in no acute distress. Eyes: Conjunctivae are normal. PERRL. EOMI. Head: Atraumatic. Nose: No congestion/rhinnorhea. Mouth/Throat: Mucous membranes are moist.  Oropharynx  non-erythematous. Neck: No stridor.   Cardiovascular: Normal rate, regular rhythm. Grossly normal heart sounds.  Good peripheral circulation. Respiratory: Normal respiratory effort.  No retractions. Lungs CTAB. Left anterior chest tender to palpation and with movement of trunk. Gastrointestinal: Soft and nontender. No distention. No abdominal bruits. No CVA tenderness. Musculoskeletal: No lower extremity tenderness nor edema.  No joint effusions. Neurologic:  Normal speech and language. No gross focal neurologic deficits are appreciated. No gait instability. Skin:  Skin is warm, dry and intact. No rash noted. Psychiatric: Mood and affect are normal. Speech and behavior are normal.  ____________________________________________   LABS (  all labs ordered are listed, but only abnormal results are displayed)  Labs Reviewed  BASIC METABOLIC PANEL - Abnormal; Notable for the following components:      Result Value   Glucose, Bld 100 (*)    All other components within normal limits  CBC - Abnormal; Notable for the following components:   RBC 4.08 (*)    Hemoglobin 12.8 (*)    HCT 38.4 (*)    All other components within normal limits  FIBRIN DERIVATIVES D-DIMER ([facility] ONLY) - Abnormal; Notable for the following components:   Fibrin derivatives D-dimer ([facility]) 642.93 (*)    All other components within normal limits  CK  TROPONIN I (HIGH SENSITIVITY)  TROPONIN I (HIGH SENSITIVITY)   ____________________________________________  EKG  ED ECG REPORT I, Luwana Butrick J, the attending physician, personally viewed and interpreted this ECG.   Date: 09/15/2019  EKG Time: 1818  Rate: 77  Rhythm: normal EKG, normal sinus rhythm  Axis: Normal  Intervals:right bundle branch block  ST&T Change: Nonspecific  ____________________________________________  RADIOLOGY  ED MD interpretation: No acute cardiopulmonary process; no PE  Official radiology report(s): DG Chest 2 View  Result Date:  09/14/2019 CLINICAL DATA:  Chest pain EXAM: CHEST - 2 VIEW COMPARISON:  05/05/2016 FINDINGS: The heart size and mediastinal contours are within normal limits. Aortic atherosclerosis. Both lungs are clear. The visualized skeletal structures are unremarkable. IMPRESSION: No active cardiopulmonary disease. Electronically Signed   By: Donavan Foil M.D.   On: 09/14/2019 18:53   CT Angio Chest PE W/Cm &/Or Wo Cm  Result Date: 09/15/2019 CLINICAL DATA:  Pt to the er for left sided chest pain that has been persistent today and does not radiate. Pt reports increased pressure with deep breathing. Pt denies cardiac history or blood clot history. Pt takes only vitamins and antidepressant. No cardiac hx. Pt has noticed some mild SOB EXAM: CT ANGIOGRAPHY CHEST WITH CONTRAST TECHNIQUE: Multidetector CT imaging of the chest was performed using the standard protocol during bolus administration of intravenous contrast. Multiplanar CT image reconstructions and MIPs were obtained to evaluate the vascular anatomy. CONTRAST:  34mL OMNIPAQUE IOHEXOL 350 MG/ML SOLN COMPARISON:  Chest radiograph, 09/14/2019 FINDINGS: Cardiovascular: There is satisfactory opacification of the pulmonary arteries to the segmental level. There is no evidence of a pulmonary embolism. Heart is top-normal in size. No pericardial effusion. No coronary artery calcifications. Great vessels are normal in caliber. There are minor atherosclerotic calcifications along the aortic arch. Mediastinum/Nodes: No enlarged mediastinal, hilar, or axillary lymph nodes. Thyroid gland, trachea, and esophagus demonstrate no significant findings. Lungs/Pleura: Minor atelectasis at the base of the left upper lobe lingula and lower lobe. Lungs otherwise clear. No pleural effusion or pneumothorax. Upper Abdomen: No acute abnormality. Musculoskeletal: No fracture or bone lesion. Review of the MIP images confirms the above findings. IMPRESSION: 1. No evidence of a pulmonary embolism. 2.  No acute findings. 3. Minor aortic atherosclerosis. Aortic Atherosclerosis (ICD10-I70.0). Electronically Signed   By: Lajean Manes M.D.   On: 09/15/2019 04:36    ____________________________________________   PROCEDURES  Procedure(s) performed (including Critical Care):  Procedures   ____________________________________________   INITIAL IMPRESSION / ASSESSMENT AND PLAN / ED COURSE  As part of my medical decision making, I reviewed the following data within the [facility] notes reviewed and incorporated, Labs reviewed, EKG interpreted, Old chart reviewed, Radiograph reviewed, Notes from prior ED visits and [place] Controlled Substance Database     Benjamin Mills was evaluated in Emergency  Department on 09/15/2019 for the symptoms described in the history of present illness. He was evaluated in the context of the global COVID-19 pandemic, which necessitated consideration that the patient might be at risk for infection with the SARS-CoV-2 virus that causes COVID-19. Institutional protocols and algorithms that pertain to the evaluation of patients at risk for COVID-19 are in a state of rapid change based on information released by regulatory bodies including the CDC and federal and state organizations. These policies and algorithms were followed during the patient's care in the ED.    74 year old male presenting with chest pain since yesterday. Differential diagnosis includes, but is not limited to, ACS, aortic dissection, pulmonary embolism, cardiac tamponade, pneumothorax, pneumonia, pericarditis, myocarditis, GI-related causes including esophagitis/gastritis, and musculoskeletal chest wall pain.    Patient has had 2 sets of negative troponins, unremarkable chest x-ray. Chest pain most likely musculoskeletal; however, given exacerbation with deep inspiration, will check D-dimer. Chest pain currently 2/10; patient declines analgesics.   Clinical Course as of Sep 15 439  Sat Sep 15, 2019  0321 Elevated D-dimer noted.  Will proceed with CTA chest to evaluate for PE.   [JS]  0440 Updated patient on unremarkable CT scan.  Strict return precautions given.  Patient verbalizes understanding and agrees with plan of care.   [JS]    Clinical Course User Index [JS] Paulette Blanch, MD     ____________________________________________   FINAL CLINICAL IMPRESSION(S) / ED DIAGNOSES  Final diagnoses:  Nonspecific chest pain  Chest wall pain     ED Discharge Orders    None       Note:  This document was prepared using Dragon voice recognition software and may include unintentional dictation errors.   Paulette Blanch, MD 09/15/19 [phone]

## 2019-09-15 NOTE — ED Notes (Signed)
Pt to the er for left sided chest pain that has been persistent today and does not radiate. Pt reports increased pressure with deep breathing. Pt denies cardiac history or blood clot history. Pt takes only vitamins and antidepressant. No cardiac hx. Pt has noticed some mild SOB. Pt reports recently some blue blister places appearing on his left forearm that were painful and under the skin but alleviated on their own.

## 2019-09-15 NOTE — ED Notes (Signed)
Patient up to toilet.  

## 2019-09-15 NOTE — Discharge Instructions (Addendum)
You may take Tylenol and/or Ibuprofen as needed for discomfort.  Return to the ER for worsening symptoms, persistent vomiting, difficulty breathing or other concerns.

## 2019-10-08 IMAGING — DX DG HAND COMPLETE 3+V*R*
3 series · 3 of 3 positions shown · non-contrast
Comparison: No recent prior.

CLINICAL DATA: Hand pain.  No known injury.

EXAM:
RIGHT HAND - COMPLETE 3+ VIEW

[hand pa]
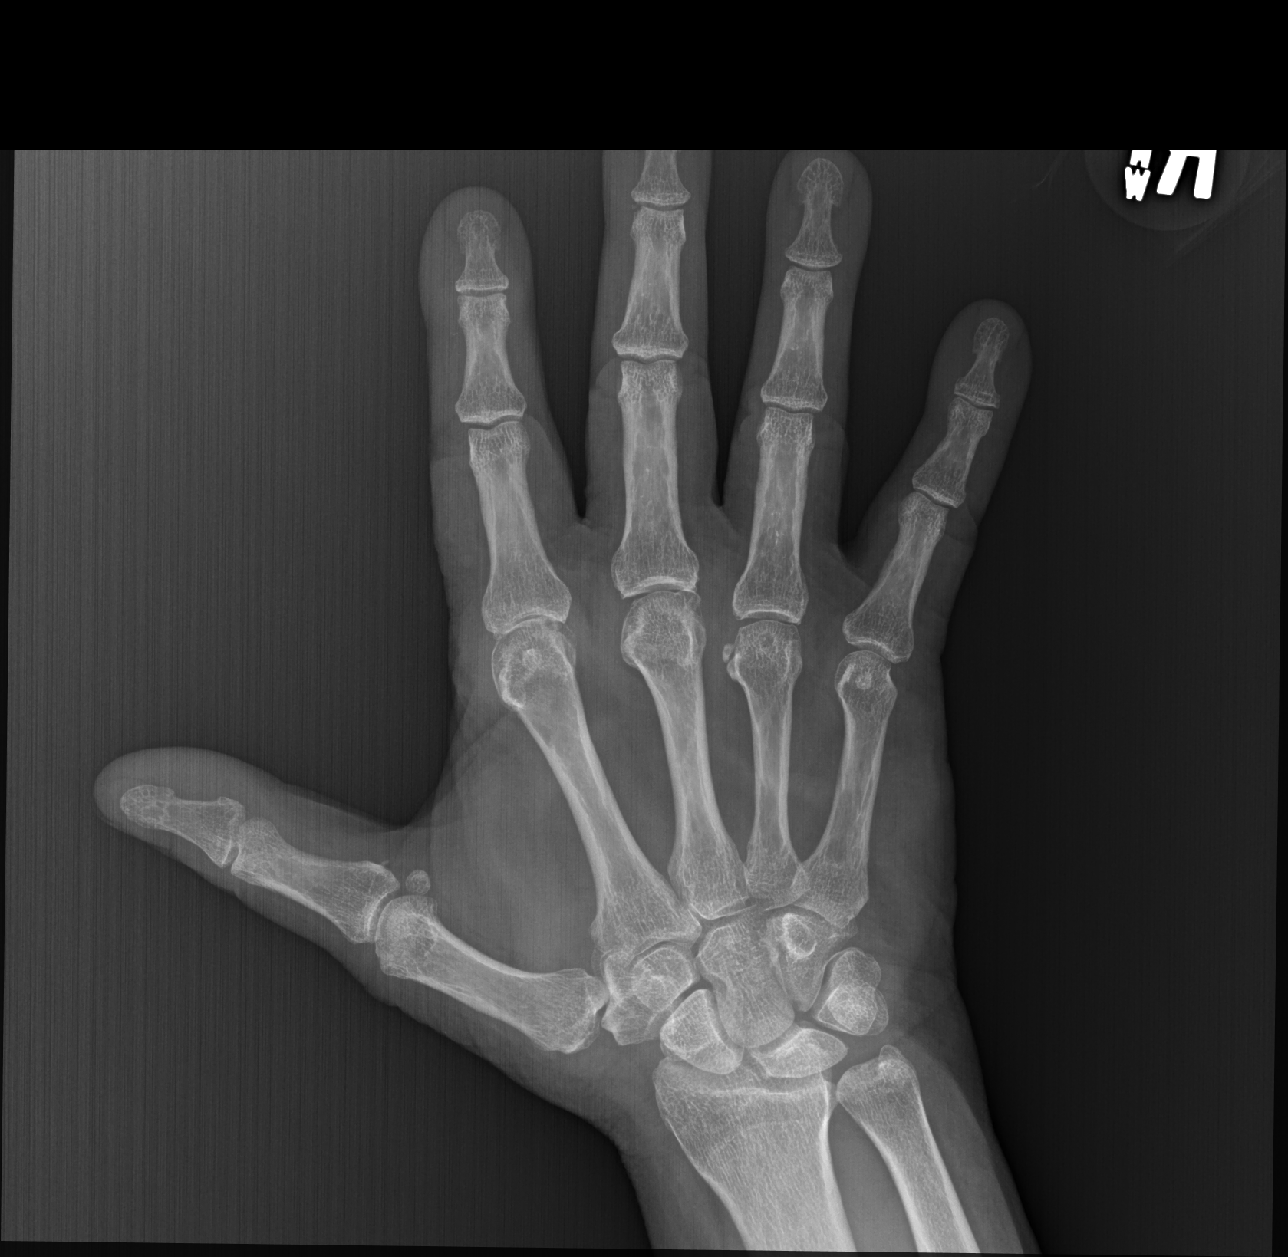

[hand obl (oblique)]
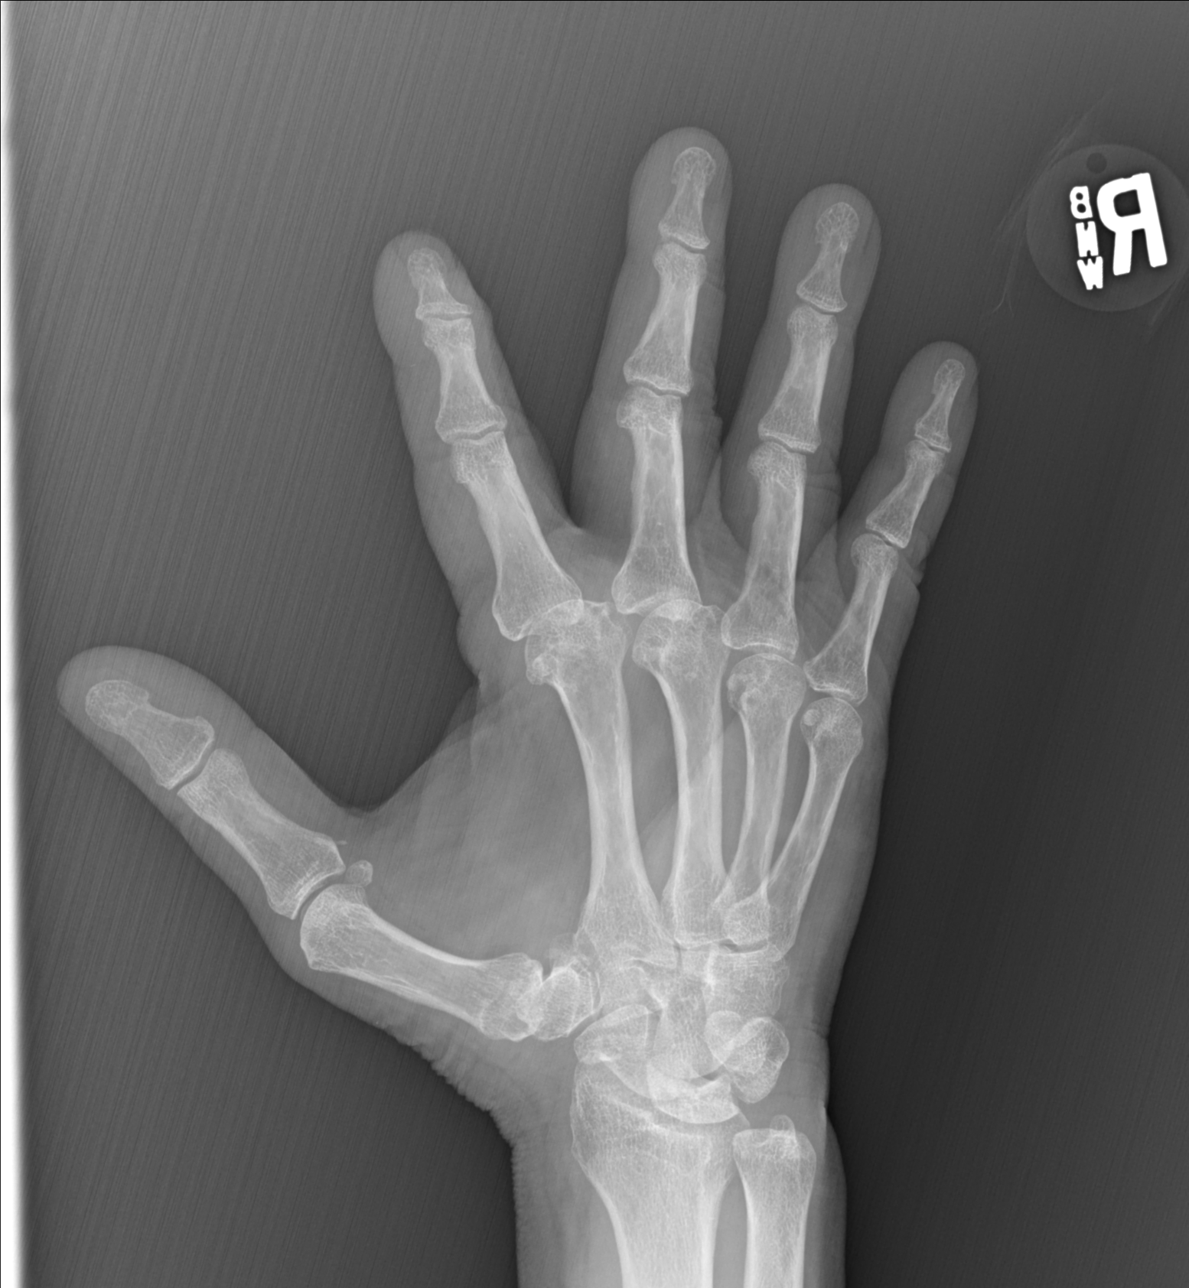

[hand lat]
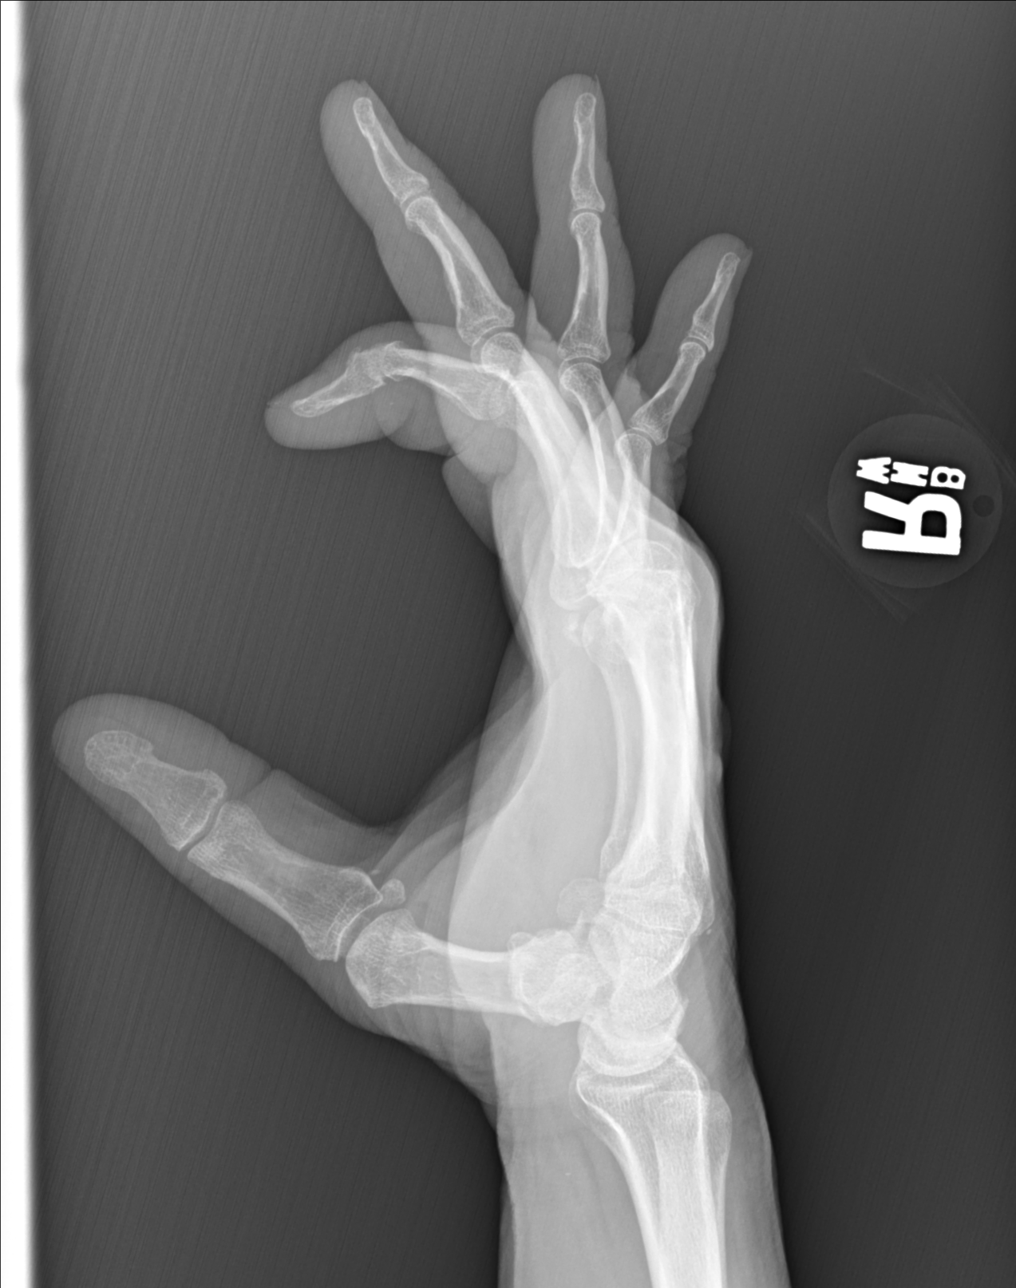

[3 of 3 positions shown; findings below may reference images not displayed]

FINDINGS: No acute bony or joint abnormality identified. Tiny bony density
noted adjacent to the proximal phalanx of the right thumb. This is
most likely an old tiny fracture fragment and/or degenerative
change. No evidence of significant fracture or dislocation. Diffuse
degenerative change.
IMPRESSION: Diffuse degenerative change. No significant acute bony or joint
abnormality.

## 2019-11-12 ENCOUNTER — Telehealth: Payer: Self-pay | Admitting: *Deleted

## 2019-11-12 NOTE — Telephone Encounter (Signed)
Left VM to get PSA completed prior to follow up with Dr. Erlene Quan.

## 2019-11-14 ENCOUNTER — Ambulatory Visit: Payer: PPO | Admitting: Urology

## 2019-11-14 ENCOUNTER — Other Ambulatory Visit: Payer: PPO

## 2019-11-14 ENCOUNTER — Other Ambulatory Visit: Payer: Self-pay

## 2019-11-14 DIAGNOSIS — C61 Malignant neoplasm of prostate: Secondary | ICD-10-CM

## 2019-11-15 ENCOUNTER — Ambulatory Visit: Payer: PPO | Admitting: Urology

## 2019-11-15 ENCOUNTER — Other Ambulatory Visit: Payer: Self-pay

## 2019-11-15 ENCOUNTER — Encounter: Payer: Self-pay | Admitting: Urology

## 2019-11-15 VITALS — BP 137/95 | HR 81 | Ht 68.0 in | Wt 195.0 lb

## 2019-11-15 DIAGNOSIS — C61 Malignant neoplasm of prostate: Secondary | ICD-10-CM

## 2019-11-15 DIAGNOSIS — Z87448 Personal history of other diseases of urinary system: Secondary | ICD-10-CM | POA: Diagnosis not present

## 2019-11-15 DIAGNOSIS — Z87442 Personal history of urinary calculi: Secondary | ICD-10-CM | POA: Diagnosis not present

## 2019-11-15 DIAGNOSIS — N5231 Erectile dysfunction following radical prostatectomy: Secondary | ICD-10-CM

## 2019-11-15 LAB — PSA: Prostate Specific Ag, Serum: <0.1 ng/mL (ref 0.0–4.0)

## 2019-11-15 NOTE — Progress Notes (Signed)
11/15/2019 10:03 AM   Benjamin Mills 18-Dec-1945 [id number]  Referring provider: McLean-Scocuzza, Nino Glow, MD [place],  [place] [address]  Chief Complaint  Patient presents with  . Prostate Cancer    HPI: 74 year old male with a personal history of prostate cancer and multiple GU issues who returns today for routine annual follow-up.  He is slightly overdue as his follow-up was put off in light of Covid.  History of prostate cancer/ rising PSA He was diagnosed and underwent laparoscopic radical prostatectomy at [facility] in [place] back in 2007. Pathology showed Gleason 3+4, [id number]. He developed evidence of biochemical recurrence with negative staging in 05/2015 with a rise in his PSA to 0.2. He underwent salvage radiation with Dr. Baruch Gouty in 07/2015 and started on Lupron x 1 year.  PSA on 04/24/2012 was 0.060 at [facility] PSA drawn by his PCP was 0.11 performed on 07/03/14 0.1 on 11/2014 0.2 on 05/28/15 <0.01 on 12/24/15 <0.01 on 12/2016 <0.01 on 12/2017 <0.1 on 03/2018 < 0.1 on 11/14/19  ED Severe erectile dysfunction following prostatectomy Previously failed PDE 5 inhibitor secondary to headache He reports today that he may be interested in pursuing this on the road but his wife recently had an accident and thus are not sexually active.  He will let us know if he like to pursue something.  SUI Minimalstress incontinence following mid urethral sling placement in 2014.  Unchanged.  No voiding complaints today.    History of hematuria Cysto negative for any bladder pathology on 09/10/14. CT Urogram 10/2014 negative. NED.  No further episodes of gross hematuria.  No microscopic blood on urinalysis provided to PCP on 07/31/2019.  Kidney stones  Seen in the emergency room on 2 occasions in 12/2017 with a 4 mm right UVJ stone by CT scan.  His pain resolved that same day but he never saw the stone pass.  No personal history of stones.      No  recent flank pain or stone episode events.   PMH: Past Medical History:  Diagnosis Date  . Amputation of finger, left    5th finger   . Arthritis    hands, back lumbar sacral   . Cancer ([place]) 2006   Prostate cancer with resection radical prostate removal. Urologist Dr. Hollice Espy locally; prostate ca dx'ed in 2007 and 2015   . Carotid artery stenosis    07/26/16 US carotid b/l 1-39%   . Cataract    s/p surgery b/l Dr. Oralia [facility] 2018  . Depression   . Diverticulosis   . Erectile dysfunction   . Gastric ulcer   . GERD (gastroesophageal reflux disease)   . Hepatic steatosis   . History of splenectomy   . Hyperlipidemia   . Kidney stone   . Spinal stenosis   . Spinal stenosis at L4-L5 level   . Thoracic aortic atherosclerosis ([place])   . Urinary stress incontinence, male     Surgical History: Past Surgical History:  Procedure Laterality Date  . INCONTINENCE SURGERY    . KNEE ARTHROSCOPY N/A 1969   meniscal tear. left   . PROSTATECTOMY  2007   removal of prostate  . REPAIR OF PERFORATED ULCER     2009  . SPLENECTOMY  1953   after trauma, fell out tree as kid  . VENTRAL HERNIA REPAIR      Home Medications:  Allergies as of 11/15/2019   No Known Allergies     Medication List  Accurate as of November 15, 2019 10:03 AM. If you have any questions, ask your nurse or doctor.        betamethasone dipropionate 0.05 % cream Apply topically as needed. Reported on 06/03/2015   calcium carbonate 600 MG Tabs tablet Commonly known as: OS-CAL Take 600 mg by mouth 2 (two) times daily with a meal.   DULoxetine 60 MG capsule Commonly known as: CYMBALTA Take 1 capsule (60 mg total) by mouth daily.   meloxicam 7.5 MG tablet Commonly known as: MOBIC Take 1 tablet (7.5 mg total) by mouth 2 (two) times daily as needed for pain.   multivitamin capsule Take 1 capsule by mouth daily.   triamcinolone ointment 0.1 % Commonly known as: KENALOG Apply 1  application topically 2 (two) times daily. Rash to leg   valACYclovir 1000 MG tablet Commonly known as: VALTREX Take 1 tablet (1,000 mg total) by mouth 2 (two) times daily as needed.       Allergies: No Known Allergies  Family History: Family History  Problem Relation Age of Onset  . Dementia Mother   . Glaucoma Mother   . Alcohol abuse Father   . Glaucoma Sister   . Heart disease Brother        heart valve issue  . Multiple sclerosis Brother   . Kidney disease Neg Hx   . Prostate cancer Neg Hx     Social History:  reports that he quit smoking about 41 years ago. He has never used smokeless tobacco. He reports current alcohol use. He reports that he does not use drugs.   Physical Exam: BP (!) 137/95   Pulse 81   Ht 5\' 8"  (1.727 m)   Wt 195 lb (88.5 kg)   BMI 29.65 kg/m   Constitutional:  Alert and oriented, No acute distress. HEENT: [place] AT, moist mucus membranes.  Trachea midline, no masses. Cardiovascular: No clubbing, cyanosis, or edema. Respiratory: Normal respiratory effort, no increased work of breathing. Skin: No rashes, bruises or suspicious lesions. Neurologic: Grossly intact, no focal deficits, moving all 4 extremities. Psychiatric: Normal mood and affect.  Laboratory Data: Lab Results  Component Value Date   WBC 8.0 09/14/2019   HGB 12.8 (L) 09/14/2019   HCT 38.4 (L) 09/14/2019   MCV 94.1 09/14/2019   PLT 236 09/14/2019    Lab Results  Component Value Date   CREATININE 0.93 09/14/2019   PSA trend as above  Lab Results  Component Value Date   HGBA1C 5.6 11/07/2018    Urinalysis UA from 4/21 reviewed  Assessment & Plan:    1. Prostate cancer ([place]) Excellent biochemical control after salvage radiation-no evidence of disease  Continue to follow PSA annually  2. Erectile dysfunction following radical prostatectomy We discussed this today, ultimately he is unsure of pursuing any intervention but may be interested down the road  3.  History of hematuria No evidence of gross or microscopic blood No further work-up indicated at this time  4. History of kidney stones No recent stone episodes, encouraged increasing hydration which he reports is an issue for him  F/u 1 year with PSA  Hollice Espy, MD  [facility] [address], [place], [place] [address] [phone]  I spent 30 total minutes on the day of the encounter including pre-visit review of the medical record, face-to-face time with the patient, and post visit ordering of labs/imaging/tests.

## 2019-12-03 ENCOUNTER — Ambulatory Visit
Admission: EM | Admit: 2019-12-03 | Discharge: 2019-12-03 | Disposition: A | Discharge status: Home / Self Care | Payer: PPO | Attending: Emergency Medicine | Admitting: Emergency Medicine

## 2019-12-03 DIAGNOSIS — B029 Zoster without complications: Secondary | ICD-10-CM

## 2019-12-03 MED ORDER — VALACYCLOVIR HCL 1 G PO TABS
1000.0000 mg | ORAL_TABLET | Freq: Three times a day (TID) | ORAL | 0 refills | Status: DC
Start: 2019-12-03 — End: 2021-05-05

## 2019-12-03 NOTE — ED Provider Notes (Signed)
Roderic Palau    CSN: [id number] Arrival date & time: 12/03/19  1257      History   Chief Complaint Chief Complaint  Patient presents with  . Rash    HPI Benjamin Mills is a 74 y.o. male.   Patient presents with a rash on the left side of his torso x2 days.  He states that his painful to touch but otherwise does not hurt.  He also reports chills. No treatments attempted at home.  He denies fever, sore throat, chest pain, shortness of breath, vomiting, diarrhea, or other symptoms.  The history is provided by the patient.    Past Medical History:  Diagnosis Date  . Amputation of finger, left    5th finger   . Arthritis    hands, back lumbar sacral   . Cancer ([place]) 2006   Prostate cancer with resection radical prostate removal. Urologist Dr. Hollice Espy locally; prostate ca dx'ed in 2007 and 2015   . Carotid artery stenosis    07/26/16 US carotid b/l 1-39%   . Cataract    s/p surgery b/l Dr. Oralia Manis [facility] 2018  . Depression   . Diverticulosis   . Erectile dysfunction   . Gastric ulcer   . GERD (gastroesophageal reflux disease)   . Hepatic steatosis   . History of splenectomy   . Hyperlipidemia   . Kidney stone   . Spinal stenosis   . Spinal stenosis at L4-L5 level   . Thoracic aortic atherosclerosis ([place])   . Urinary stress incontinence, male     Patient Active Problem List   Diagnosis Date Noted  . Bilateral carotid artery stenosis 07/31/2019  . Bilateral impacted cerumen 01/30/2019  . Cervicalgia 10/25/2018  . Kidney stone on right side 02/17/2018  . Arthralgia 02/17/2018  . Lumbar radiculopathy 11/09/2017  . Spinal stenosis at L4-L5 level 08/16/2017  . Bunion of great toe of left foot 08/16/2017  . Seborrheic keratoses 08/16/2017  . Multiple benign nevi 08/16/2017  . Spondylosis of lumbosacral region 03/30/2017  . Osteoarthritis 03/30/2017  . Benign skin lesion 11/05/2015  . SUI (stress urinary incontinence), male 12/10/2014  .  Erectile dysfunction following radical prostatectomy 12/10/2014  . Adjustment disorder with mixed anxiety and depressed mood 08/06/2014  . Hyperlipidemia 06/05/2014  . History of prostate cancer 06/05/2014  . History of splenectomy 06/05/2014  . H/O cold sores 06/05/2014    Past Surgical History:  Procedure Laterality Date  . INCONTINENCE SURGERY    . KNEE ARTHROSCOPY N/A 1969   meniscal tear. left   . PROSTATECTOMY  2007   removal of prostate  . REPAIR OF PERFORATED ULCER     2009  . SPLENECTOMY  1953   after trauma, fell out tree as kid  . VENTRAL HERNIA REPAIR         Home Medications    Prior to Admission medications   Medication Sig Start Date End Date Taking? Authorizing Provider  betamethasone dipropionate (DIPROLENE) 0.05 % cream Apply topically as needed. Reported on 06/03/2015    [provider]  calcium carbonate (OS-CAL) 600 MG TABS tablet Take 600 mg by mouth 2 (two) times daily with a meal.    [provider]  DULoxetine (CYMBALTA) 60 MG capsule Take 1 capsule (60 mg total) by mouth daily. 12/06/18   McLean-Scocuzza, Nino Glow, MD  meloxicam (MOBIC) 7.5 MG tablet Take 1 tablet (7.5 mg total) by mouth 2 (two) times daily as needed for pain. 08/31/19  McLean-Scocuzza, Nino Glow, MD  Multiple Vitamin (MULTIVITAMIN) capsule Take 1 capsule by mouth daily.    [provider]  triamcinolone ointment (KENALOG) 0.1 % Apply 1 application topically 2 (two) times daily. Rash to leg 11/30/18   McLean-Scocuzza, Nino Glow, MD  valACYclovir (VALTREX) 1000 MG tablet Take 1 tablet (1,000 mg total) by mouth 3 (three) times daily. 12/03/19   Sharion Balloon, NP    Family History Family History  Problem Relation Age of Onset  . Dementia Mother   . Glaucoma Mother   . Alcohol abuse Father   . Glaucoma Sister   . Heart disease Brother        heart valve issue  . Multiple sclerosis Brother   . Kidney disease Neg Hx   . Prostate cancer Neg Hx     Social  History Social History   Tobacco Use  . Smoking status: Former Smoker    Quit date: 06/05/1978    Years since quitting: 41.5  . Smokeless tobacco: Never Used  . Tobacco comment: quit in 1980s duration 6-7 years 1 ppd no FH lung cancer   Substance Use Topics  . Alcohol use: Yes    Alcohol/week: 0.0 standard drinks    Comment: OCC  . Drug use: No     Allergies   Patient has no known allergies.   Review of Systems Review of Systems  Constitutional: Positive for chills. Negative for fever.  HENT: Negative for ear pain and sore throat.   Eyes: Negative for pain and visual disturbance.  Respiratory: Negative for cough and shortness of breath.   Cardiovascular: Negative for chest pain and palpitations.  Gastrointestinal: Negative for abdominal pain and vomiting.  Genitourinary: Negative for dysuria and hematuria.  Musculoskeletal: Negative for arthralgias and back pain.  Skin: Positive for rash. Negative for color change.  Neurological: Negative for seizures and syncope.  All other systems reviewed and are negative.    Physical Exam Triage Vital Signs ED Triage Vitals  Enc Vitals Group     BP      Pulse      Resp      Temp      Temp src      SpO2      Weight      Height      Head Circumference      Peak Flow      Pain Score      Pain Loc      Pain Edu?      Excl. in [place]?    No data found.  Updated Vital Signs BP 120/84 (BP Location: Left Arm)   Pulse 67   Temp 98.2 F (36.8 C) (Temporal) Comment: Probe for oral temp was broken  Resp 18   SpO2 96%   Visual Acuity Right Eye Distance:   Left Eye Distance:   Bilateral Distance:    Right Eye Near:   Left Eye Near:    Bilateral Near:     Physical Exam Vitals and nursing note reviewed.  Constitutional:      General: He is not in acute distress.    Appearance: He is well-developed.  HENT:     Head: Normocephalic and atraumatic.     Mouth/Throat:     Mouth: Mucous membranes are moist.  Eyes:      Conjunctiva/sclera: Conjunctivae normal.  Cardiovascular:     Rate and Rhythm: Normal rate and regular rhythm.     Heart sounds: No murmur heard.  Pulmonary:     Effort: Pulmonary effort is normal. No respiratory distress.     Breath sounds: Normal breath sounds.  Abdominal:     Palpations: Abdomen is soft.     Tenderness: There is no abdominal tenderness. There is no guarding or rebound.  Musculoskeletal:     Cervical back: Neck supple.  Skin:    General: Skin is warm and dry.     Findings: Rash present.     Comments: Erythematous vesicular rash on left side; along dermatome line from chest to back.    Neurological:     General: No focal deficit present.     Mental Status: He is alert and oriented to person, place, and time.     Gait: Gait normal.  Psychiatric:        Mood and Affect: Mood normal.        Behavior: Behavior normal.          UC Treatments / Results  Labs (all labs ordered are listed, but only abnormal results are displayed) Labs Reviewed - No data to display  EKG   Radiology No results found.  Procedures Procedures (including critical care time)  Medications Ordered in UC Medications - No data to display  Initial Impression / Assessment and Plan / UC Course  I have reviewed the triage vital signs and the nursing notes.  Pertinent labs & imaging results that were available during my care of the patient were reviewed by me and considered in my medical decision making (see chart for details).   Herpes zoster without complication.  Treating with Valtrex.  Education provided about shingles.  Instructed patient to follow-up with his PCP if his symptoms or not improving.  Patient agrees to plan of care.   Final Clinical Impressions(s) / UC Diagnoses   Final diagnoses:  Herpes zoster without complication     Discharge Instructions     Take the valacyclovir as directed.  See the attached instructions for Shingles.   Follow up with your  primary care provider if your symptoms are not improving.       ED Prescriptions    Medication Sig Dispense Auth. Provider   valACYclovir (VALTREX) 1000 MG tablet Take 1 tablet (1,000 mg total) by mouth 3 (three) times daily. 21 tablet Sharion Balloon, NP     PDMP not reviewed this encounter.   Sharion Balloon, NP 12/03/19 [phone]

## 2019-12-03 NOTE — Discharge Instructions (Signed)
Take the valacyclovir as directed.  See the attached instructions for Shingles.   Follow up with your primary care provider if your symptoms are not improving.

## 2019-12-03 NOTE — ED Triage Notes (Signed)
Pt reports having a rash in the left side of his torso x 3 days. Pt states the rash is painful to touch. Denies burring sensation, itchiness, fever. Pt has not tried nay medication for the complaint.

## 2019-12-04 ENCOUNTER — Telehealth: Payer: Self-pay | Admitting: Internal Medicine

## 2019-12-04 NOTE — Telephone Encounter (Signed)
I spoke with patient & he is only taking the valtrex. He has tried nothing or taken nothing for his pain.

## 2019-12-04 NOTE — Telephone Encounter (Signed)
Patient picked up Valtrex. Would you be willing to send patient any medication for pain? Please advise?

## 2019-12-04 NOTE — Telephone Encounter (Signed)
Patient called on the status of his request for pain medication.

## 2019-12-04 NOTE — Telephone Encounter (Signed)
Pt went to UC and was diagnosed with shingles. They advised him to contact his PCP to get pain medication. No appts avail asap like he needs. Please advise

## 2019-12-04 NOTE — Telephone Encounter (Signed)
Just FYI I called patient & advised on tylenol schedule. Patient stated that he does take the meloxicam daily for arthritis pain. I asked that he give the tylenol a try starting this afternoon & stay on a schedule with it to get ahead of his pain. Then let us know if no improvement.

## 2019-12-04 NOTE — Telephone Encounter (Signed)
Need to know what he is taking now.  What has he tried?

## 2019-12-04 NOTE — Telephone Encounter (Signed)
I would recommend tylenol - 2 extra strength 2-3x/day prn.

## 2019-12-10 ENCOUNTER — Encounter: Payer: Self-pay | Admitting: Internal Medicine

## 2019-12-10 ENCOUNTER — Telehealth: Payer: Self-pay | Admitting: Internal Medicine

## 2019-12-10 NOTE — Telephone Encounter (Signed)
Left message for patient to call back and schedule Medicare Annual Wellness Visit (AWV)   This should be a telephone visit only=30 minutes.  Last AWV 04/08/16; please schedule at anytime with Denisa O'Brien-Blaney at [facility].

## 2019-12-11 NOTE — Telephone Encounter (Signed)
Given persistent increased pain, can schedule a virtual visit to discuss treatment options.

## 2019-12-12 ENCOUNTER — Ambulatory Visit
Admission: EM | Admit: 2019-12-12 | Discharge: 2019-12-12 | Disposition: A | Discharge status: Home / Self Care | Payer: PPO | Attending: Family Medicine | Admitting: Family Medicine

## 2019-12-12 ENCOUNTER — Encounter: Payer: Self-pay | Admitting: Internal Medicine

## 2019-12-12 DIAGNOSIS — B0229 Other postherpetic nervous system involvement: Secondary | ICD-10-CM | POA: Diagnosis not present

## 2019-12-12 MED ORDER — GABAPENTIN 300 MG PO CAPS
300.0000 mg | ORAL_CAPSULE | Freq: Three times a day (TID) | ORAL | 0 refills | Status: DC
Start: 2019-12-12 — End: 2020-02-01

## 2019-12-12 NOTE — ED Provider Notes (Signed)
[place]   [id number] 12/12/19 Arrival Time: 4098  CC: RASH  SUBJECTIVE:  Benjamin Mills is a 74 y.o. male who presents with a skin complaint that began about 10 days ago. Denies precipitating event or trauma. Reports that he was seen in this office and was diagnosed with shingles. Has completed valtrex. Reports that the rash is healing, but the pain is lingering. Has tried OTC medications with no relief. There are no aggravating or alleviating factors. Denies similar symptoms in the past. Denies fever, chills, nausea, vomiting, erythema, swelling, discharge, oral lesions, SOB, chest pain, abdominal pain, changes in bowel or bladder function.    ROS: As per HPI.  All other pertinent ROS negative.     Past Medical History:  Diagnosis Date  . Amputation of finger, left    5th finger   . Arthritis    hands, back lumbar sacral   . Cancer ([place]) 2006   Prostate cancer with resection radical prostate removal. Urologist Dr. Hollice Espy locally; prostate ca dx'ed in 2007 and 2015   . Carotid artery stenosis    07/26/16 US carotid b/l 1-39%   . Cataract    s/p surgery b/l Dr. Oralia Manis [facility] 2018  . Depression   . Diverticulosis   . Erectile dysfunction   . Gastric ulcer   . GERD (gastroesophageal reflux disease)   . Hepatic steatosis   . History of splenectomy   . Hyperlipidemia   . Kidney stone   . Spinal stenosis   . Spinal stenosis at L4-L5 level   . Thoracic aortic atherosclerosis ([place])   . Urinary stress incontinence, male    Past Surgical History:  Procedure Laterality Date  . INCONTINENCE SURGERY    . KNEE ARTHROSCOPY N/A 1969   meniscal tear. left   . PROSTATECTOMY  2007   removal of prostate  . REPAIR OF PERFORATED ULCER     2009  . SPLENECTOMY  1953   after trauma, fell out tree as kid  . VENTRAL HERNIA REPAIR     No Known Allergies No current facility-administered medications on file prior to encounter.   Current Outpatient Medications  on File Prior to Encounter  Medication Sig Dispense Refill  . betamethasone dipropionate (DIPROLENE) 0.05 % cream Apply topically as needed. Reported on 06/03/2015    . calcium carbonate (OS-CAL) 600 MG TABS tablet Take 600 mg by mouth 2 (two) times daily with a meal.    . DULoxetine (CYMBALTA) 60 MG capsule Take 1 capsule (60 mg total) by mouth daily. 90 capsule 3  . meloxicam (MOBIC) 7.5 MG tablet Take 1 tablet (7.5 mg total) by mouth 2 (two) times daily as needed for pain. 90 tablet 1  . Multiple Vitamin (MULTIVITAMIN) capsule Take 1 capsule by mouth daily.    Marland Kitchen triamcinolone ointment (KENALOG) 0.1 % Apply 1 application topically 2 (two) times daily. Rash to leg 80 g 5  . valACYclovir (VALTREX) 1000 MG tablet Take 1 tablet (1,000 mg total) by mouth 3 (three) times daily. 21 tablet 0   Social History   Socioeconomic History  . Marital status: Married    Spouse name: Not on file  . Number of children: Not on file  . Years of education: Not on file  . Highest education level: Not on file  Occupational History  . Not on file  Tobacco Use  . Smoking status: Former Smoker    Quit date: 06/05/1978    Years since quitting: 41.5  .  Smokeless tobacco: Never Used  . Tobacco comment: quit in 1980s duration 6-7 years 1 ppd no FH lung cancer   Substance and Sexual Activity  . Alcohol use: Yes    Alcohol/week: 0.0 standard drinks    Comment: OCC  . Drug use: No  . Sexual activity: Not Currently  Other Topics Concern  . Not on file  Social History Narrative   Born in [place].   Lived in [place].    Moved to [facility], brother lives in [place]       Has 2 brothers and 1 sister.      Lives with wife in [place]. Has 3 daughters.      Work - retired, Surveyor, quantity      Diet - regular      Exercise - walks occasionally   Social Determinants of Radio broadcast assistant Strain:   . Difficulty of Paying Living Expenses: Not on file  Food Insecurity:   . Worried About  Charity fundraiser in the Last Year: Not on file  . Ran Out of Food in the Last Year: Not on file  Transportation Needs:   . Lack of Transportation (Medical): Not on file  . Lack of Transportation (Non-Medical): Not on file  Physical Activity:   . Days of Exercise per Week: Not on file  . Minutes of Exercise per Session: Not on file  Stress:   . Feeling of Stress : Not on file  Social Connections:   . Frequency of Communication with Friends and Family: Not on file  . Frequency of Social Gatherings with Friends and Family: Not on file  . Attends Religious Services: Not on file  . Active Member of Clubs or Organizations: Not on file  . Attends Archivist Meetings: Not on file  . Marital Status: Not on file  Intimate Partner Violence:   . Fear of Current or Ex-Partner: Not on file  . Emotionally Abused: Not on file  . Physically Abused: Not on file  . Sexually Abused: Not on file   Family History  Problem Relation Age of Onset  . Dementia Mother   . Glaucoma Mother   . Alcohol abuse Father   . Glaucoma Sister   . Heart disease Brother        heart valve issue  . Multiple sclerosis Brother   . Kidney disease Neg Hx   . Prostate cancer Neg Hx     OBJECTIVE: Vitals:   12/12/19 1511  BP: 124/87  Pulse: 78  Resp: 20  Temp: 98 F (36.7 C)  TempSrc: Oral  SpO2: 97%    General appearance: alert; no distress Head: NCAT Lungs: clear to auscultation bilaterally Heart: regular rate and rhythm.  Radial pulse 2+ bilaterally Extremities: no edema Skin: warm and dry; healing rash to L torso and back along dermatome, consistent with herpes zoster Psychological: alert and cooperative; normal mood and affect  ASSESSMENT & PLAN:  1. Post herpetic neuralgia     Meds ordered this encounter  Medications  . gabapentin (NEURONTIN) 300 MG capsule    Sig: Take 1 capsule (300 mg total) by mouth 3 (three) times daily.    Dispense:  30 capsule    Refill:  0    Order  Specific Question:   Supervising Provider    Answer:   Chase Picket [id number]    Prescribed gabapentin Take as prescribed and to completion Avoid hot showers/ baths Moisturize skin daily  Follow  up with PCP if symptoms persists Return or go to the ER if you have any new or worsening symptoms such as fever, chills, nausea, vomiting, redness, swelling, discharge, if symptoms do not improve with medications  Reviewed expectations re: course of current medical issues. Questions answered. Outlined signs and symptoms indicating need for more acute intervention. Patient verbalized understanding. After Visit Summary given.   Faustino Congress, NP 12/12/19 1525

## 2019-12-12 NOTE — Discharge Instructions (Signed)
I have sent in gabapentin for your nerve pain. You may take this three times per day as needed for pain.  This medication may make  you sleepy. Do not drive or operate heavy machinery until you know how this medication will affect you.  Follow up with primary care as needed.

## 2019-12-12 NOTE — ED Triage Notes (Signed)
Pt reports having shingle sin the left side of his torso for the past 10 days approx. Pt needs some mediation to relieve the pain from the shingles.

## 2019-12-26 ENCOUNTER — Other Ambulatory Visit: Payer: Self-pay | Admitting: Internal Medicine

## 2019-12-26 DIAGNOSIS — M542 Cervicalgia: Secondary | ICD-10-CM

## 2019-12-26 DIAGNOSIS — M4805 Spinal stenosis, thoracolumbar region: Secondary | ICD-10-CM

## 2019-12-26 DIAGNOSIS — M2550 Pain in unspecified joint: Secondary | ICD-10-CM

## 2019-12-26 MED ORDER — DULOXETINE HCL 60 MG PO CPEP: 60.0000 mg | ORAL_CAPSULE | Freq: Every day | ORAL | 3 refills | Status: DC

## 2019-12-26 MED ORDER — MELOXICAM 7.5 MG PO TABS: 7.5000 mg | ORAL_TABLET | Freq: Two times a day (BID) | ORAL | 1 refills | Status: DC | PRN

## 2020-02-01 ENCOUNTER — Ambulatory Visit (INDEPENDENT_AMBULATORY_CARE_PROVIDER_SITE_OTHER): Payer: PPO | Admitting: Internal Medicine

## 2020-02-01 ENCOUNTER — Other Ambulatory Visit: Payer: Self-pay

## 2020-02-01 ENCOUNTER — Encounter: Payer: Self-pay | Admitting: Internal Medicine

## 2020-02-01 VITALS — BP 110/64 | HR 71 | Temp 98.3°F | Ht 68.0 in | Wt 198.2 lb

## 2020-02-01 DIAGNOSIS — M19012 Primary osteoarthritis, left shoulder: Secondary | ICD-10-CM | POA: Diagnosis not present

## 2020-02-01 DIAGNOSIS — Z23 Encounter for immunization: Secondary | ICD-10-CM | POA: Diagnosis not present

## 2020-02-01 DIAGNOSIS — Z1329 Encounter for screening for other suspected endocrine disorder: Secondary | ICD-10-CM | POA: Diagnosis not present

## 2020-02-01 DIAGNOSIS — E785 Hyperlipidemia, unspecified: Secondary | ICD-10-CM

## 2020-02-01 DIAGNOSIS — Z8619 Personal history of other infectious and parasitic diseases: Secondary | ICD-10-CM | POA: Diagnosis not present

## 2020-02-01 DIAGNOSIS — Z0000 Encounter for general adult medical examination without abnormal findings: Secondary | ICD-10-CM

## 2020-02-01 DIAGNOSIS — Z8546 Personal history of malignant neoplasm of prostate: Secondary | ICD-10-CM | POA: Diagnosis not present

## 2020-02-01 HISTORY — DX: Primary osteoarthritis, left shoulder: M19.012

## 2020-02-01 LAB — COMPREHENSIVE METABOLIC PANEL
ALT: 17 U/L (ref 0–53)
AST: 22 U/L (ref 0–37)
Albumin: 4.1 g/dL (ref 3.5–5.2)
Alkaline Phosphatase: 72 U/L (ref 39–117)
BUN: 17 mg/dL (ref 6–23)
CO2: 31 mEq/L (ref 19–32)
Calcium: 9.4 mg/dL (ref 8.4–10.5)
Chloride: 103 mEq/L (ref 96–112)
Creatinine, Ser: 0.73 mg/dL (ref 0.40–1.50)
GFR: 89.46 mL/min (ref >60.00)
Glucose, Bld: 92 mg/dL (ref 70–99)
Potassium: 4.6 mEq/L (ref 3.5–5.1)
Sodium: 138 mEq/L (ref 135–145)
Total Bilirubin: 0.7 mg/dL (ref 0.2–1.2)
Total Protein: 6 g/dL (ref 6.0–8.3)

## 2020-02-01 LAB — LIPID PANEL
Cholesterol: 207 mg/dL — ABNORMAL HIGH (ref 0–200)
HDL: 66.1 mg/dL (ref >39.00)
LDL Cholesterol: 123 mg/dL — ABNORMAL HIGH (ref 0–99)
NonHDL: 141.24
Total CHOL/HDL Ratio: 3
Triglycerides: 89 mg/dL (ref 0.0–149.0)
VLDL: 17.8 mg/dL (ref 0.0–40.0)

## 2020-02-01 LAB — CBC WITH DIFFERENTIAL/PLATELET
Basophils Absolute: 0 10*3/uL (ref 0.0–0.1)
Basophils Relative: 0.7 % (ref 0.0–3.0)
Eosinophils Absolute: 0.3 10*3/uL (ref 0.0–0.7)
Eosinophils Relative: 5.2 % — ABNORMAL HIGH (ref 0.0–5.0)
HCT: 39.9 % (ref 39.0–52.0)
Hemoglobin: 13.3 g/dL (ref 13.0–17.0)
Lymphocytes Relative: 23.5 % (ref 12.0–46.0)
Lymphs Abs: 1.3 10*3/uL (ref 0.7–4.0)
MCHC: 33.2 g/dL (ref 30.0–36.0)
MCV: 94.4 fl (ref 78.0–100.0)
Monocytes Absolute: 0.5 10*3/uL (ref 0.1–1.0)
Monocytes Relative: 8 % (ref 3.0–12.0)
Neutro Abs: 3.5 10*3/uL (ref 1.4–7.7)
Neutrophils Relative %: 62.6 % (ref 43.0–77.0)
Platelets: 238 10*3/uL (ref 150.0–400.0)
RBC: 4.23 Mil/uL (ref 4.22–5.81)
RDW: 13.7 % (ref 11.5–15.5)
WBC: 5.7 10*3/uL (ref 4.0–10.5)

## 2020-02-01 LAB — TSH: TSH: 0.63 u[IU]/mL (ref 0.35–4.50)

## 2020-02-01 NOTE — Progress Notes (Addendum)
Chief Complaint  Patient presents with  . Follow-up  . Immunizations   F/u  1. Flu shot givne today 2. Mild left shoulder upper arm pain with  ROM 11/2018 Xray mild AC arthritis nothing tried other than voltaren gel and did not help  3. Had shingles left chest/flank 12/03/19 and did not pick up valtrex Rx 12/22/19 seen again Ed again and was given gabapentin 300 tid and helped with pain and rash resolved   Review of Systems  Constitutional: Negative for weight loss.  HENT: Negative for hearing loss.   Eyes: Negative for blurred vision.  Respiratory: Negative for shortness of breath.   Cardiovascular: Negative for chest pain.  Gastrointestinal: Negative for abdominal pain.  Musculoskeletal: Positive for joint pain. Negative for falls.  Skin: Negative for rash.  Neurological: Negative for headaches.  Psychiatric/Behavioral: Negative for depression.   Past Medical History:  Diagnosis Date  . Amputation of finger, left    5th finger   . Arthritis    hands, back lumbar sacral   . Cancer ([place]) 2006   Prostate cancer with resection radical prostate removal. Urologist Dr. Hollice Espy locally; prostate ca dx'ed in 2007 and 2015   . Carotid artery stenosis    07/26/16 US carotid b/l 1-39%   . Cataract    s/p surgery b/l Dr. Oralia Manis [facility] 2018  . Depression   . Diverticulosis   . Erectile dysfunction   . Gastric ulcer   . GERD (gastroesophageal reflux disease)   . Hepatic steatosis   . History of splenectomy   . Hyperlipidemia   . Kidney stone   . Spinal stenosis   . Spinal stenosis at L4-L5 level   . Thoracic aortic atherosclerosis ([place])   . Urinary stress incontinence, male    Past Surgical History:  Procedure Laterality Date  . INCONTINENCE SURGERY    . KNEE ARTHROSCOPY N/A 1969   meniscal tear. left   . PROSTATECTOMY  2007   removal of prostate  . REPAIR OF PERFORATED ULCER     2009  . SPLENECTOMY  1953   after trauma, fell out tree as kid  . VENTRAL HERNIA  REPAIR     Family History  Problem Relation Age of Onset  . Dementia Mother   . Glaucoma Mother   . Alcohol abuse Father   . Glaucoma Sister   . Heart disease Brother        heart valve issue  . Multiple sclerosis Brother   . Kidney disease Neg Hx   . Prostate cancer Neg Hx    Social History   Socioeconomic History  . Marital status: Married    Spouse name: Not on file  . Number of children: Not on file  . Years of education: Not on file  . Highest education level: Not on file  Occupational History  . Not on file  Tobacco Use  . Smoking status: Former Smoker    Quit date: 06/05/1978    Years since quitting: 41.6  . Smokeless tobacco: Never Used  . Tobacco comment: quit in 1980s duration 6-7 years 1 ppd no FH lung cancer   Substance and Sexual Activity  . Alcohol use: Yes    Alcohol/week: 0.0 standard drinks    Comment: OCC  . Drug use: No  . Sexual activity: Not Currently  Other Topics Concern  . Not on file  Social History Narrative   Born in [place].   Lived in [place].    Moved to  [place], brother lives in [place] another [place]       Has 2 brothers and 1 sister.      Lives with wife in [place]. Has 3 daughters.      Work - retired, Surveyor, quantity      Diet - regular      Exercise - walks occasionally   Social Determinants of Radio broadcast assistant Strain:   . Difficulty of Paying Living Expenses: Not on file  Food Insecurity:   . Worried About Charity fundraiser in the Last Year: Not on file  . Ran Out of Food in the Last Year: Not on file  Transportation Needs:   . Lack of Transportation (Medical): Not on file  . Lack of Transportation (Non-Medical): Not on file  Physical Activity:   . Days of Exercise per Week: Not on file  . Minutes of Exercise per Session: Not on file  Stress:   . Feeling of Stress : Not on file  Social Connections:   . Frequency of Communication with Friends and Family: Not on file  . Frequency of Social Gatherings  with Friends and Family: Not on file  . Attends Religious Services: Not on file  . Active Member of Clubs or Organizations: Not on file  . Attends Archivist Meetings: Not on file  . Marital Status: Not on file  Intimate Partner Violence:   . Fear of Current or Ex-Partner: Not on file  . Emotionally Abused: Not on file  . Physically Abused: Not on file  . Sexually Abused: Not on file   Current Meds  Medication Sig  . betamethasone dipropionate (DIPROLENE) 0.05 % cream Apply topically as needed. Reported on 06/03/2015  . DULoxetine (CYMBALTA) 60 MG capsule Take 1 capsule (60 mg total) by mouth daily.  . meloxicam (MOBIC) 7.5 MG tablet Take 1 tablet (7.5 mg total) by mouth 2 (two) times daily as needed for pain.  . Multiple Vitamin (MULTIVITAMIN) capsule Take 1 capsule by mouth daily.  Marland Kitchen triamcinolone ointment (KENALOG) 0.1 % Apply 1 application topically 2 (two) times daily. Rash to leg  . valACYclovir (VALTREX) 1000 MG tablet Take 1 tablet (1,000 mg total) by mouth 3 (three) times daily.  . [DISCONTINUED] gabapentin (NEURONTIN) 300 MG capsule Take 1 capsule (300 mg total) by mouth 3 (three) times daily.   No Known Allergies Recent Results (from the past 2160 hour(s))  PSA     Status: None   Collection Time: 11/14/19 10:35 AM  Result Value Ref Range   Prostate Specific Ag, Serum <0.1 0.0 - 4.0 ng/mL    Comment: Roche ECLIA methodology. According to the American Urological Association, Serum PSA should decrease and remain at undetectable levels after radical prostatectomy. The AUA defines biochemical recurrence as an initial PSA value 0.2 ng/mL or greater followed by a subsequent confirmatory PSA value 0.2 ng/mL or greater. Values obtained with different assay methods or kits cannot be used interchangeably. Results cannot be interpreted as absolute evidence of the presence or absence of malignant disease.    Objective  Body mass index is 30.14 kg/m. Wt Readings from  Last 3 Encounters:  02/01/20 198 lb 3.2 oz (89.9 kg)  11/15/19 195 lb (88.5 kg)  09/14/19 196 lb (88.9 kg)   Temp Readings from Last 3 Encounters:  02/01/20 98.3 F (36.8 C) (Oral)  12/12/19 98 F (36.7 C) (Oral)  12/03/19 98.2 F (36.8 C) (Temporal)   BP Readings from Last 3 Encounters:  02/01/20  110/64  12/12/19 124/87  12/03/19 120/84   Pulse Readings from Last 3 Encounters:  02/01/20 71  12/12/19 78  12/03/19 67    Physical Exam Vitals and nursing note reviewed.  Constitutional:      Appearance: Normal appearance. He is well-developed and well-groomed. He is obese.  HENT:     Head: Normocephalic and atraumatic.  Eyes:     Conjunctiva/sclera: Conjunctivae normal.     Pupils: Pupils are equal, round, and reactive to light.  Cardiovascular:     Rate and Rhythm: Normal rate and regular rhythm.     Heart sounds: Normal heart sounds. No murmur heard.   Pulmonary:     Effort: Pulmonary effort is normal.     Breath sounds: Normal breath sounds.  Abdominal:     General: Abdomen is flat. Bowel sounds are normal.     Tenderness: There is no abdominal tenderness.  Skin:    General: Skin is warm and dry.  Neurological:     General: No focal deficit present.     Mental Status: He is alert and oriented to person, place, and time. Mental status is at baseline.     Gait: Gait normal.  Psychiatric:        Attention and Perception: Attention and perception normal.        Mood and Affect: Mood and affect normal.        Speech: Speech normal.        Behavior: Behavior normal. Behavior is cooperative.        Thought Content: Thought content normal.        Cognition and Memory: Cognition and memory normal.        Judgment: Judgment normal.     Assessment  Plan  Hyperlipidemia, unspecified hyperlipidemia type - Plan: Comprehensive metabolic panel, Lipid panel, CBC with Differential/Platelet  History of prostate cancer Cmet, cbc   History of shingles  Resolved left  chest/left blank   Left shoulder pain f/u Dr. Roland Rack upcoming sent note   HM Had flu shotutdgiven today Tdap 2015 Had prevnar and pna 23 shingrix Rxhas not had yetgiven tx today covid 2/2 pfizer  Need to get colonoscopy record just have nl per report in chartgiven release of recordspt signed again today to get from [place]  Fuurology. Rad onc apptsh/o prostate cancerPSA neg 9/2019due to f/u 03/2019 <0.01 negative  Results for Benjamin Mills, Benjamin Mills (MRN [id number]) as of 02/01/2020 09:09  Ref. Range 11/14/2019 10:35  Prostate Specific Ag, Serum Latest Ref Range: 0.0 - 4.0 ng/mL <0.1    dermatology established with [address]not seen 2020seen end 06/2019 or 07/2019 doing well no tx or bx f/u 1 year  Hep C neg 04/08/17   Former smoker quit in 1980s smoked x 6-7 years no FH lung cancer smoked 1 ppd  Provider: Dr. Olivia Mackie McLean-Scocuzza-Internal Medicine

## 2020-02-01 NOTE — Patient Instructions (Addendum)
[facility]  [place], [place] [address]  [phone]  Poggi, Smith Mince, MD  [place]  [facility] [place],  [address]  [phone]  [phone] (Fax)     Consider shingrix vaccine in 90 days  Late 02/2020 early 03/2020   CLINICAL DATA:  Left shoulder pain  EXAM: LEFT SHOULDER - 2+ VIEW  COMPARISON:  None.  FINDINGS: Degenerative changes in the [facility] joint with joint space narrowing and spurring. Glenohumeral joint is maintained. No acute bony abnormality. Specifically, no fracture, subluxation, or dislocation. Soft tissues are intact.  IMPRESSION: Early degenerative changes in the left AC joint. No acute bony abnormality.   Electronically Signed   By: Rolm Baptise M.D.   On: 11/13/2018 20:57

## 2020-02-07 ENCOUNTER — Telehealth: Payer: Self-pay | Admitting: Internal Medicine

## 2020-02-07 NOTE — Telephone Encounter (Signed)
Faxed to Dr Lendon Collar [facility] ortho having live pain shoudler can you do steriod injection faxed on 02-07-20

## 2020-02-15 ENCOUNTER — Other Ambulatory Visit: Payer: Self-pay | Admitting: Surgery

## 2020-02-15 ENCOUNTER — Other Ambulatory Visit (HOSPITAL_COMMUNITY): Payer: Self-pay | Admitting: Surgery

## 2020-02-15 DIAGNOSIS — M7582 Other shoulder lesions, left shoulder: Secondary | ICD-10-CM | POA: Diagnosis not present

## 2020-02-15 DIAGNOSIS — M1712 Unilateral primary osteoarthritis, left knee: Secondary | ICD-10-CM | POA: Diagnosis not present

## 2020-02-15 DIAGNOSIS — M7522 Bicipital tendinitis, left shoulder: Secondary | ICD-10-CM

## 2020-02-15 DIAGNOSIS — M25512 Pain in left shoulder: Secondary | ICD-10-CM | POA: Diagnosis not present

## 2020-02-15 DIAGNOSIS — G8929 Other chronic pain: Secondary | ICD-10-CM | POA: Diagnosis not present

## 2020-02-15 DIAGNOSIS — M75122 Complete rotator cuff tear or rupture of left shoulder, not specified as traumatic: Secondary | ICD-10-CM | POA: Insufficient documentation

## 2020-02-15 HISTORY — DX: Bicipital tendinitis, left shoulder: M75.22

## 2020-02-15 HISTORY — DX: Complete rotator cuff tear or rupture of left shoulder, not specified as traumatic: M75.122

## 2020-02-28 ENCOUNTER — Other Ambulatory Visit: Payer: Self-pay

## 2020-02-28 ENCOUNTER — Ambulatory Visit
Admission: RE | Admit: 2020-02-28 | Discharge: 2020-02-28 | Disposition: A | Discharge status: Home / Self Care | Payer: PPO | Source: Ambulatory Visit | Attending: Surgery | Admitting: Surgery

## 2020-02-28 DIAGNOSIS — S43432A Superior glenoid labrum lesion of left shoulder, initial encounter: Secondary | ICD-10-CM | POA: Diagnosis not present

## 2020-02-28 DIAGNOSIS — M7582 Other shoulder lesions, left shoulder: Secondary | ICD-10-CM | POA: Insufficient documentation

## 2020-02-28 DIAGNOSIS — S43402A Unspecified sprain of left shoulder joint, initial encounter: Secondary | ICD-10-CM | POA: Diagnosis not present

## 2020-02-28 DIAGNOSIS — S46012A Strain of muscle(s) and tendon(s) of the rotator cuff of left shoulder, initial encounter: Secondary | ICD-10-CM | POA: Diagnosis not present

## 2020-02-28 DIAGNOSIS — M7522 Bicipital tendinitis, left shoulder: Secondary | ICD-10-CM | POA: Insufficient documentation

## 2020-02-28 DIAGNOSIS — S46112A Strain of muscle, fascia and tendon of long head of biceps, left arm, initial encounter: Secondary | ICD-10-CM | POA: Diagnosis not present

## 2020-03-24 ENCOUNTER — Other Ambulatory Visit: Payer: Self-pay

## 2020-03-24 DIAGNOSIS — M2550 Pain in unspecified joint: Secondary | ICD-10-CM

## 2020-03-24 DIAGNOSIS — M542 Cervicalgia: Secondary | ICD-10-CM

## 2020-03-24 DIAGNOSIS — M4805 Spinal stenosis, thoracolumbar region: Secondary | ICD-10-CM

## 2020-03-25 MED ORDER — MELOXICAM 7.5 MG PO TABS: 7.5000 mg | ORAL_TABLET | Freq: Two times a day (BID) | ORAL | 1 refills | Status: DC | PRN

## 2020-04-08 ENCOUNTER — Other Ambulatory Visit: Payer: Self-pay

## 2020-04-08 ENCOUNTER — Ambulatory Visit (INDEPENDENT_AMBULATORY_CARE_PROVIDER_SITE_OTHER): Payer: PPO | Admitting: Internal Medicine

## 2020-04-08 ENCOUNTER — Encounter: Payer: Self-pay | Admitting: Internal Medicine

## 2020-04-08 VITALS — BP 124/78 | HR 88 | Temp 97.7°F | Ht 67.99 in | Wt 189.8 lb

## 2020-04-08 DIAGNOSIS — R413 Other amnesia: Secondary | ICD-10-CM | POA: Diagnosis not present

## 2020-04-08 DIAGNOSIS — E785 Hyperlipidemia, unspecified: Secondary | ICD-10-CM

## 2020-04-08 DIAGNOSIS — M67912 Unspecified disorder of synovium and tendon, left shoulder: Secondary | ICD-10-CM | POA: Insufficient documentation

## 2020-04-08 DIAGNOSIS — Z0000 Encounter for general adult medical examination without abnormal findings: Secondary | ICD-10-CM

## 2020-04-08 DIAGNOSIS — E538 Deficiency of other specified B group vitamins: Secondary | ICD-10-CM

## 2020-04-08 DIAGNOSIS — R936 Abnormal findings on diagnostic imaging of limbs: Secondary | ICD-10-CM

## 2020-04-08 DIAGNOSIS — R739 Hyperglycemia, unspecified: Secondary | ICD-10-CM

## 2020-04-08 DIAGNOSIS — M109 Gout, unspecified: Secondary | ICD-10-CM

## 2020-04-08 HISTORY — DX: Unspecified disorder of synovium and tendon, left shoulder: M67.912

## 2020-04-08 HISTORY — DX: Abnormal findings on diagnostic imaging of limbs: R93.6

## 2020-04-08 NOTE — Progress Notes (Addendum)
Chief Complaint  Patient presents with  . Follow-up    Pt would like to discuss upcoming surgery for left knee and discuss shoulder injury   F/u  1. Pending left total knee 04/29/20 with Dr. Joice Lofts also needs left shoulder surgery vs replacement for abnormal MRI 02/28/20 with arthritis, rotator cuff issues and tears no pain today  2. C/o memory loss MMSE today score 28/30 missed serial #s of 7s and spelling of world he reports he misplaces items at times at work no other c/w with memory otherwise    Review of Systems  Constitutional: Positive for weight loss.       Down 9 lbs   HENT: Negative for hearing loss.   Eyes: Negative for blurred vision.  Respiratory: Negative for shortness of breath.   Cardiovascular: Negative for chest pain.  Gastrointestinal: Negative for abdominal pain.  Musculoskeletal: Negative for falls and joint pain.  Skin: Negative for rash.  Neurological: Negative for headaches.  Psychiatric/Behavioral: Positive for memory loss.   Past Medical History:  Diagnosis Date  . Amputation of finger, left    5th finger   . Arthritis    hands, back lumbar sacral   . Cancer ([facility]) 2006   Prostate cancer with resection radical prostate removal. Urologist Dr. Vanna Scotland locally; prostate ca dx'ed in 2007 and 2015   . Carotid artery stenosis    07/26/16 US carotid b/l 1-39%   . Cataract    s/p surgery b/l Dr. Lindi Adie [facility] 2018  . Depression   . Diverticulosis   . Erectile dysfunction   . Gastric ulcer   . GERD (gastroesophageal reflux disease)   . Hepatic steatosis   . History of splenectomy   . Hyperlipidemia   . Kidney stone   . Spinal stenosis   . Spinal stenosis at L4-L5 level   . Thoracic aortic atherosclerosis ([facility])   . Urinary stress incontinence, male    Past Surgical History:  Procedure Laterality Date  . INCONTINENCE SURGERY    . KNEE ARTHROSCOPY N/A 1969   meniscal tear. left   . PROSTATECTOMY  2007   removal of prostate  . REPAIR OF  PERFORATED ULCER     2009  . SPLENECTOMY  1953   after trauma, fell out tree as kid  . VENTRAL HERNIA REPAIR     Family History  Problem Relation Age of Onset  . Dementia Mother   . Glaucoma Mother   . Alcohol abuse Father   . Glaucoma Sister   . Heart disease Brother        heart valve issue  . Multiple sclerosis Brother   . Kidney disease Neg Hx   . Prostate cancer Neg Hx    Social History   Socioeconomic History  . Marital status: Married    Spouse name: Not on file  . Number of children: Not on file  . Years of education: Not on file  . Highest education level: Not on file  Occupational History  . Not on file  Tobacco Use  . Smoking status: Former Smoker    Quit date: 06/05/1978    Years since quitting: 41.8  . Smokeless tobacco: Never Used  . Tobacco comment: quit in 1980s duration 6-7 years 1 ppd no FH lung cancer   Substance and Sexual Activity  . Alcohol use: Yes    Alcohol/week: 0.0 standard drinks    Comment: OCC  . Drug use: No  . Sexual activity: Not Currently  Other  Topics Concern  . Not on file  Social History Narrative   Born in [place].   Lived in [place].    Moved to [facility], brother lives in [place] another [place]       Has 2 brothers and 1 sister.      Lives with wife in [place]. Has 3 daughters.      Work - retired, Insurance risk surveyor      Diet - regular      Exercise - walks occasionally   Social Determinants of Corporate investment banker Strain: Not on file  Food Insecurity: Not on file  Transportation Needs: Not on file  Physical Activity: Not on file  Stress: Not on file  Social Connections: Not on file  Intimate Partner Violence: Not on file   Current Meds  Medication Sig  . betamethasone dipropionate (DIPROLENE) 0.05 % cream Apply topically as needed. Reported on 06/03/2015  . DULoxetine (CYMBALTA) 60 MG capsule Take 1 capsule (60 mg total) by mouth daily.  . meloxicam (MOBIC) 7.5 MG tablet Take 1 tablet (7.5 mg total) by mouth  2 (two) times daily as needed for pain.  . Multiple Vitamin (MULTIVITAMIN) capsule Take 1 capsule by mouth daily.  Marland Kitchen triamcinolone ointment (KENALOG) 0.1 % Apply 1 application topically 2 (two) times daily. Rash to leg  . valACYclovir (VALTREX) 1000 MG tablet Take 1 tablet (1,000 mg total) by mouth 3 (three) times daily.   No Known Allergies Recent Results (from the past 2160 hour(s))  Comprehensive metabolic panel     Status: None   Collection Time: 02/01/20  9:42 AM  Result Value Ref Range   Sodium 138 135 - 145 mEq/L   Potassium 4.6 3.5 - 5.1 mEq/L   Chloride 103 96 - 112 mEq/L   CO2 31 19 - 32 mEq/L   Glucose, Bld 92 70 - 99 mg/dL   BUN 17 6 - 23 mg/dL   Creatinine, Ser 7.41 0.40 - 1.50 mg/dL   Total Bilirubin 0.7 0.2 - 1.2 mg/dL   Alkaline Phosphatase 72 39 - 117 U/L   AST 22 0 - 37 U/L   ALT 17 0 - 53 U/L   Total Protein 6.0 6.0 - 8.3 g/dL   Albumin 4.1 3.5 - 5.2 g/dL   GFR 28.78 >67.67 mL/min   Calcium 9.4 8.4 - 10.5 mg/dL  Lipid panel     Status: Abnormal   Collection Time: 02/01/20  9:42 AM  Result Value Ref Range   Cholesterol 207 (H) 0 - 200 mg/dL    Comment: ATP III Classification       Desirable:  < 200 mg/dL               Borderline High:  200 - 239 mg/dL          High:  > = 209 mg/dL   Triglycerides 47.0 0.0 - 149.0 mg/dL    Comment: Normal:  <962 mg/dLBorderline High:  150 - 199 mg/dL   HDL 83.66 >29.47 mg/dL   VLDL 65.4 0.0 - 65.0 mg/dL   LDL Cholesterol 354 (H) 0 - 99 mg/dL   Total CHOL/HDL Ratio 3     Comment:                Men          Women1/2 Average Risk     3.4          3.3Average Risk          5.0  4.42X Average Risk          9.6          7.13X Average Risk          15.0          11.0                       NonHDL 141.24     Comment: NOTE:  Non-HDL goal should be 30 mg/dL higher than patient's LDL goal (i.e. LDL goal of < 70 mg/dL, would have non-HDL goal of < 100 mg/dL)  CBC with Differential/Platelet     Status: Abnormal   Collection Time:  02/01/20  9:42 AM  Result Value Ref Range   WBC 5.7 4.0 - 10.5 K/uL   RBC 4.23 4.22 - 5.81 Mil/uL   Hemoglobin 13.3 13.0 - 17.0 g/dL   HCT 39.9 39.0 - 52.0 %   MCV 94.4 78.0 - 100.0 fl   MCHC 33.2 30.0 - 36.0 g/dL   RDW 13.7 11.5 - 15.5 %   Platelets 238.0 150.0 - 400.0 K/uL   Neutrophils Relative % 62.6 43.0 - 77.0 %   Lymphocytes Relative 23.5 12.0 - 46.0 %   Monocytes Relative 8.0 3.0 - 12.0 %   Eosinophils Relative 5.2 (H) 0.0 - 5.0 %   Basophils Relative 0.7 0.0 - 3.0 %   Neutro Abs 3.5 1.4 - 7.7 K/uL   Lymphs Abs 1.3 0.7 - 4.0 K/uL   Monocytes Absolute 0.5 0.1 - 1.0 K/uL   Eosinophils Absolute 0.3 0.0 - 0.7 K/uL   Basophils Absolute 0.0 0.0 - 0.1 K/uL  TSH     Status: None   Collection Time: 02/01/20  9:42 AM  Result Value Ref Range   TSH 0.63 0.35 - 4.50 uIU/mL   Objective  Body mass index is 28.87 kg/m. Wt Readings from Last 3 Encounters:  04/08/20 189 lb 12.8 oz (86.1 kg)  02/01/20 198 lb 3.2 oz (89.9 kg)  11/15/19 195 lb (88.5 kg)   Temp Readings from Last 3 Encounters:  04/08/20 97.7 F (36.5 C)  02/01/20 98.3 F (36.8 C) (Oral)  12/12/19 98 F (36.7 C) (Oral)   BP Readings from Last 3 Encounters:  04/08/20 124/78  02/01/20 110/64  12/12/19 124/87   Pulse Readings from Last 3 Encounters:  04/08/20 88  02/01/20 71  12/12/19 78    Physical Exam Vitals and nursing note reviewed.  Constitutional:      Appearance: Normal appearance. He is well-developed, well-groomed and overweight.  HENT:     Head: Normocephalic and atraumatic.  Eyes:     Conjunctiva/sclera: Conjunctivae normal.     Pupils: Pupils are equal, round, and reactive to light.  Cardiovascular:     Rate and Rhythm: Normal rate and regular rhythm.     Heart sounds: Normal heart sounds. No murmur heard.   Pulmonary:     Effort: Pulmonary effort is normal.     Breath sounds: Normal breath sounds.  Skin:    General: Skin is warm and dry.  Neurological:     General: No focal deficit  present.     Mental Status: He is alert and oriented to person, place, and time. Mental status is at baseline.     Gait: Gait normal.  Psychiatric:        Attention and Perception: Attention and perception normal.        Mood and Affect: Mood and affect normal.  Speech: Speech normal.        Behavior: Behavior normal. Behavior is cooperative.        Thought Content: Thought content normal.        Cognition and Memory: Cognition and memory normal.        Judgment: Judgment normal.     Assessment  Plan  Memory loss MMSE 28/20 today likely age related  Check B12  Consider neurology if worsening   Abnormal MRI, shoulder Disorder of left rotator cuff Also needs TKR  Likely cyst left wrist  -Dr. Joice Lofts sch 04/30/19 left total knee low cardiac risk   HM Had flu shotutd Tdap 2015 Had prevnar and pna 23 shingrix Rxhas not had yetgiven tx today covid 2/2 pfizer sch 04/10/20 3rd dose Fasting labs 11/2020   Need to get colonoscopy record just have nl per report in chartgiven release of recordspt signed again today to get from [place] -sent4threquesttoday 04/08/20   Fuurology. Rad onc apptsh/o prostate cancerPSA neg 9/2019due to f/u 03/2019<0.01 negative   Ref. Range 11/14/2019 10:35  Prostate Specific Ag, Serum Latest Ref Range: 0.0 - 4.0 ng/mL <0.1    dermatology established with [address]not seen 2020seen end 06/2019 or 07/2019 doing well no tx or bxf/u 1 year  Hep C neg 04/08/17   Former smoker quit in 1980s smoked x 6-7 years no FH lung cancer smoked 1 ppd     Provider: Dr. French Ana McLean-Scocuzza-Internal Medicine

## 2020-04-08 NOTE — Patient Instructions (Addendum)
Dr. Tamera Punt ([facility]) in [place]  Dr. Theda Sers or [facility] in [place] (shoulder) Dr. Ronnie Derby shoulder [facility]  Dr. Maureen Ralphs in [place] shoulder  Let me know if you want a referral   prevagen ?  Rosemary herb   Memory Compensation Strategies  1. Use "WARM" strategy.  W= write it down  A= associate it  R= repeat it  M= make a mental note  2.   You can keep a Social worker.  Use a 3-ring notebook with sections for the following: calendar, important names and phone numbers,  medications, doctors' names/phone numbers, lists/reminders, and a section to journal what you did  each day.   3.    Use a calendar to write appointments down.  4.    Write yourself a schedule for the day.  This can be placed on the calendar or in a separate section of the Memory Notebook.  Keeping a  regular schedule can help memory.  5.    Use medication organizer with sections for each day or morning/evening pills.  You may need help loading it  6.    Keep a basket, or pegboard by the door.  Place items that you need to take out with you in the basket or on the pegboard.  You may also want to  include a message board for reminders.  7.    Use sticky notes.  Place sticky notes with reminders in a place where the task is performed.  For example: " turn off the  stove" placed by the stove, "lock the door" placed on the door at eye level, " take your medications" on  the bathroom mirror or by the place where you normally take your medications.  8.    Use alarms/timers.  Use while cooking to remind yourself to check on food or as a reminder to take your medicine, or as a  reminder to make a call, or as a reminder to perform another task, etc.   Shoulder Exercises Ask your health care provider which exercises are safe for you. Do exercises exactly as told by your health care provider and adjust them as directed. It is normal to feel mild stretching, pulling, tightness, or discomfort as you  do these exercises. Stop right away if you feel sudden pain or your pain gets worse. Do not begin these exercises until told by your health care provider. Stretching exercises External rotation and abduction This exercise is sometimes called corner stretch. This exercise rotates your arm outward (external rotation) and moves your arm out from your body (abduction). 1. Stand in a doorway with one of your feet slightly in front of the other. This is called a staggered stance. If you cannot reach your forearms to the door frame, stand facing a corner of a room. 2. Choose one of the following positions as told by your health care provider: ? Place your hands and forearms on the door frame above your head. ? Place your hands and forearms on the door frame at the height of your head. ? Place your hands on the door frame at the height of your elbows. 3. Slowly move your weight onto your front foot until you feel a stretch across your chest and in the front of your shoulders. Keep your head and chest upright and keep your abdominal muscles tight. 4. Hold for __________ seconds. 5. To release the stretch, shift your weight to your back foot. Repeat __________ times. Complete this exercise __________ times a day.  Extension, standing 1. Stand and hold a broomstick, a cane, or a similar object behind your back. ? Your hands should be a little wider than shoulder width apart. ? Your palms should face away from your back. 2. Keeping your elbows straight and your shoulder muscles relaxed, move the stick away from your body until you feel a stretch in your shoulders (extension). ? Avoid shrugging your shoulders while you move the stick. Keep your shoulder blades tucked down toward the middle of your back. 3. Hold for __________ seconds. 4. Slowly return to the starting position. Repeat __________ times. Complete this exercise __________ times a day. Range-of-motion exercises Pendulum  1. Stand near a wall  or a surface that you can hold onto for balance. 2. Bend at the waist and let your left / right arm hang straight down. Use your other arm to support you. Keep your back straight and do not lock your knees. 3. Relax your left / right arm and shoulder muscles, and move your hips and your trunk so your left / right arm swings freely. Your arm should swing because of the motion of your body, not because you are using your arm or shoulder muscles. 4. Keep moving your hips and trunk so your arm swings in the following directions, as told by your health care provider: ? Side to side. ? Forward and backward. ? In clockwise and counterclockwise circles. 5. Continue each motion for __________ seconds, or for as long as told by your health care provider. 6. Slowly return to the starting position. Repeat __________ times. Complete this exercise __________ times a day. Shoulder flexion, standing  1. Stand and hold a broomstick, a cane, or a similar object. Place your hands a little more than shoulder width apart on the object. Your left / right hand should be palm up, and your other hand should be palm down. 2. Keep your elbow straight and your shoulder muscles relaxed. Push the stick up with your healthy arm to raise your left / right arm in front of your body, and then over your head until you feel a stretch in your shoulder (flexion). ? Avoid shrugging your shoulder while you raise your arm. Keep your shoulder blade tucked down toward the middle of your back. 3. Hold for __________ seconds. 4. Slowly return to the starting position. Repeat __________ times. Complete this exercise __________ times a day. Shoulder abduction, standing 1. Stand and hold a broomstick, a cane, or a similar object. Place your hands a little more than shoulder width apart on the object. Your left / right hand should be palm up, and your other hand should be palm down. 2. Keep your elbow straight and your shoulder muscles relaxed.  Push the object across your body toward your left / right side. Raise your left / right arm to the side of your body (abduction) until you feel a stretch in your shoulder. ? Do not raise your arm above shoulder height unless your health care provider tells you to do that. ? If directed, raise your arm over your head. ? Avoid shrugging your shoulder while you raise your arm. Keep your shoulder blade tucked down toward the middle of your back. 3. Hold for __________ seconds. 4. Slowly return to the starting position. Repeat __________ times. Complete this exercise __________ times a day. Internal rotation  1. Place your left / right hand behind your back, palm up. 2. Use your other hand to dangle an exercise band, a towel, or  a similar object over your shoulder. Grasp the band with your left / right hand so you are holding on to both ends. 3. Gently pull up on the band until you feel a stretch in the front of your left / right shoulder. The movement of your arm toward the center of your body is called internal rotation. ? Avoid shrugging your shoulder while you raise your arm. Keep your shoulder blade tucked down toward the middle of your back. 4. Hold for __________ seconds. 5. Release the stretch by letting go of the band and lowering your hands. Repeat __________ times. Complete this exercise __________ times a day. Strengthening exercises External rotation  1. Sit in a stable chair without armrests. 2. Secure an exercise band to a stable object at elbow height on your left / right side. 3. Place a soft object, such as a folded towel or a small pillow, between your left / right upper arm and your body to move your elbow about 4 inches (10 cm) away from your side. 4. Hold the end of the exercise band so it is tight and there is no slack. 5. Keeping your elbow pressed against the soft object, slowly move your forearm out, away from your abdomen (external rotation). Keep your body steady so only  your forearm moves. 6. Hold for __________ seconds. 7. Slowly return to the starting position. Repeat __________ times. Complete this exercise __________ times a day. Shoulder abduction  1. Sit in a stable chair without armrests, or stand up. 2. Hold a __________ weight in your left / right hand, or hold an exercise band with both hands. 3. Start with your arms straight down and your left / right palm facing in, toward your body. 4. Slowly lift your left / right hand out to your side (abduction). Do not lift your hand above shoulder height unless your health care provider tells you that this is safe. ? Keep your arms straight. ? Avoid shrugging your shoulder while you do this movement. Keep your shoulder blade tucked down toward the middle of your back. 5. Hold for __________ seconds. 6. Slowly lower your arm, and return to the starting position. Repeat __________ times. Complete this exercise __________ times a day. Shoulder extension 1. Sit in a stable chair without armrests, or stand up. 2. Secure an exercise band to a stable object in front of you so it is at shoulder height. 3. Hold one end of the exercise band in each hand. Your palms should face each other. 4. Straighten your elbows and lift your hands up to shoulder height. 5. Step back, away from the secured end of the exercise band, until the band is tight and there is no slack. 6. Squeeze your shoulder blades together as you pull your hands down to the sides of your thighs (extension). Stop when your hands are straight down by your sides. Do not let your hands go behind your body. 7. Hold for __________ seconds. 8. Slowly return to the starting position. Repeat __________ times. Complete this exercise __________ times a day. Shoulder row 1. Sit in a stable chair without armrests, or stand up. 2. Secure an exercise band to a stable object in front of you so it is at waist height. 3. Hold one end of the exercise band in each  hand. Position your palms so that your thumbs are facing the ceiling (neutral position). 4. Bend each of your elbows to a 90-degree angle (right angle) and keep your upper arms  at your sides. 5. Step back until the band is tight and there is no slack. 6. Slowly pull your elbows back behind you. 7. Hold for __________ seconds. 8. Slowly return to the starting position. Repeat __________ times. Complete this exercise __________ times a day. Shoulder press-ups  1. Sit in a stable chair that has armrests. Sit upright, with your feet flat on the floor. 2. Put your hands on the armrests so your elbows are bent and your fingers are pointing forward. Your hands should be about even with the sides of your body. 3. Push down on the armrests and use your arms to lift yourself off the chair. Straighten your elbows and lift yourself up as much as you comfortably can. ? Move your shoulder blades down, and avoid letting your shoulders move up toward your ears. ? Keep your feet on the ground. As you get stronger, your feet should support less of your body weight as you lift yourself up. 4. Hold for __________ seconds. 5. Slowly lower yourself back into the chair. Repeat __________ times. Complete this exercise __________ times a day. Wall push-ups  1. Stand so you are facing a stable wall. Your feet should be about one arm-length away from the wall. 2. Lean forward and place your palms on the wall at shoulder height. 3. Keep your feet flat on the floor as you bend your elbows and lean forward toward the wall. 4. Hold for __________ seconds. 5. Straighten your elbows to push yourself back to the starting position. Repeat __________ times. Complete this exercise __________ times a day. This information is not intended to replace advice given to you by your health care provider. Make sure you discuss any questions you have with your health care provider. Document Revised: 07/21/2018 Document Reviewed:  04/28/2018 Elsevier Patient Education  2020 [employer]  Shoulder Impingement Syndrome Rehab Ask your health care provider which exercises are safe for you. Do exercises exactly as told by your health care provider and adjust them as directed. It is normal to feel mild stretching, pulling, tightness, or discomfort as you do these exercises. Stop right away if you feel sudden pain or your pain gets worse. Do not begin these exercises until told by your health care provider. Stretching and range-of-motion exercise This exercise warms up your muscles and joints and improves the movement and flexibility of your shoulder. This exercise also helps to relieve pain and stiffness. Passive horizontal adduction In passive adduction, you use your other hand to move the injured arm toward your body. The injured arm does not move on its own. In this movement, your arm is moved across your body in the horizontal plane (horizontal adduction). 1. Sit or stand and pull your left / right elbow across your chest, toward your other shoulder. Stop when you feel a gentle stretch in the back of your shoulder and upper arm. ? Keep your arm at shoulder height. ? Keep your arm as close to your body as you comfortably can. 2. Hold for __________ seconds. 3. Slowly return to the starting position. Repeat __________ times. Complete this exercise __________ times a day. Strengthening exercises These exercises build strength and endurance in your shoulder. Endurance is the ability to use your muscles for a long time, even after they get tired. External rotation, isometric This is an exercise in which you press the back of your wrist against a door frame without moving your shoulder joint (isometric). 1. Stand or sit in a doorway, facing the  door frame. 2. Bend your left / right elbow and place the back of your wrist against the door frame. Only the back of your wrist should be touching the frame. Keep your upper arm at your  side. 3. Gently press your wrist against the door frame, as if you are trying to push your arm away from your abdomen (external rotation). Press as hard as you are able without pain. ? Avoid shrugging your shoulder while you press your wrist against the door frame. Keep your shoulder blade tucked down toward the middle of your back. 4. Hold for __________ seconds. 5. Slowly release the tension, and relax your muscles completely before you repeat the exercise. Repeat __________ times. Complete this exercise __________ times a day. Internal rotation, isometric This is an exercise in which you press your palm against a door frame without moving your shoulder joint (isometric). 1. Stand or sit in a doorway, facing the door frame. 2. Bend your left / right elbow and place the palm of your hand against the door frame. Only your palm should be touching the frame. Keep your upper arm at your side. 3. Gently press your hand against the door frame, as if you are trying to push your arm toward your abdomen (internal rotation). Press as hard as you are able without pain. ? Avoid shrugging your shoulder while you press your hand against the door frame. Keep your shoulder blade tucked down toward the middle of your back. 4. Hold for __________ seconds. 5. Slowly release the tension, and relax your muscles completely before you repeat the exercise. Repeat __________ times. Complete this exercise __________ times a day. Scapular protraction, supine  1. Lie on your back on a firm surface (supine position). Hold a __________ weight in your left / right hand. 2. Raise your left / right arm straight into the air so your hand is directly above your shoulder joint. 3. Push the weight into the air so your shoulder (scapula) lifts off the surface that you are lying on. The scapula will push up or forward (protraction). Do not move your head, neck, or back. 4. Hold for __________ seconds. 5. Slowly return to the  starting position. Let your muscles relax completely before you repeat this exercise. Repeat __________ times. Complete this exercise __________ times a day. Scapular retraction  1. Sit in a stable chair without armrests, or stand up. 2. Secure an exercise band to a stable object in front of you so the band is at shoulder height. 3. Hold one end of the exercise band in each hand. Your palms should face down. 4. Squeeze your shoulder blades together (retraction) and move your elbows slightly behind you. Do not shrug your shoulders upward while you do this. 5. Hold for __________ seconds. 6. Slowly return to the starting position. Repeat __________ times. Complete this exercise __________ times a day. Shoulder extension  1. Sit in a stable chair without armrests, or stand up. 2. Secure an exercise band to a stable object in front of you so the band is above shoulder height. 3. Hold one end of the exercise band in each hand. 4. Straighten your elbows and lift your hands up to shoulder height. 5. Squeeze your shoulder blades together and pull your hands down to the sides of your thighs (extension). Stop when your hands are straight down by your sides. Do not let your hands go behind your body. 6. Hold for __________ seconds. 7. Slowly return to the starting position. Repeat  __________ times. Complete this exercise __________ times a day. This information is not intended to replace advice given to you by your health care provider. Make sure you discuss any questions you have with your health care provider. Document Revised: 07/21/2018 Document Reviewed: 04/24/2018 Elsevier Patient Education  [place].    Ganglion Cyst  A ganglion cyst is a non-cancerous, fluid-filled lump that occurs near a joint or tendon. The cyst grows out of a joint or the lining of a tendon. Ganglion cysts most often develop in the hand or wrist, but they can also develop in the shoulder, elbow, hip, knee,  ankle, or foot. Ganglion cysts are ball-shaped or egg-shaped. Their size can range from the size of a pea to larger than a grape. Increased activity may cause the cyst to get bigger because more fluid starts to build up. What are the causes? The exact cause of this condition is not known, but it may be related to:  Inflammation or irritation around the joint.  An injury.  Repetitive movements or overuse.  Arthritis. What increases the risk? You are more likely to develop this condition if:  You are a woman.  You are 70-43 years old. What are the signs or symptoms? The main symptom of this condition is a lump. It most often appears on the hand or wrist. In many cases, there are no other symptoms, but a cyst can sometimes cause:  Tingling.  Pain.  Numbness.  Muscle weakness.  Weak grip.  Less range of motion in a joint. How is this diagnosed? Ganglion cysts are usually diagnosed based on a physical exam. Your health care provider will feel the lump and may shine a light next to it. If it is a ganglion cyst, the light will likely shine through it. Your health care provider may order an X-ray, ultrasound, or MRI to rule out other conditions. How is this treated? Ganglion cysts often go away on their own without treatment. If you have pain or other symptoms, treatment may be needed. Treatment is also needed if the ganglion cyst limits your movement or if it gets infected. Treatment may include:  Wearing a brace or splint on your wrist or finger.  Taking anti-inflammatory medicine.  Having fluid drained from the lump with a needle (aspiration).  Getting a steroid injected into the joint.  Having surgery to remove the ganglion cyst.  Placing a pad on your shoe or wearing shoes that will not rub against the cyst if it is on your foot. Follow these instructions at home:  Do not press on the ganglion cyst, poke it with a needle, or hit it.  Take over-the-counter and  prescription medicines only as told by your health care provider.  If you have a brace or splint: ? Wear it as told by your health care provider. ? Remove it as told by your health care provider. Ask if you need to remove it when you take a shower or a bath.  Watch your ganglion cyst for any changes.  Keep all follow-up visits as told by your health care provider. This is important. Contact a health care provider if:  Your ganglion cyst becomes larger or more painful.  You have pus coming from the lump.  You have weakness or numbness in the affected area.  You have a fever or chills. Get help right away if:  You have a fever and have any of these in the cyst area: ? Increased redness. ? Red streaks. ?  Swelling. Summary  A ganglion cyst is a non-cancerous, fluid-filled lump that occurs near a joint or tendon.  Ganglion cysts most often develop in the hand or wrist, but they can also develop in the shoulder, elbow, hip, knee, ankle, or foot.  Ganglion cysts often go away on their own without treatment. This information is not intended to replace advice given to you by your health care provider. Make sure you discuss any questions you have with your health care provider. Document Revised: 03/11/2017 Document Reviewed: 11/26/2016 Elsevier Patient Education  [place].

## 2020-04-10 ENCOUNTER — Encounter: Payer: Self-pay | Admitting: Internal Medicine

## 2020-04-10 ENCOUNTER — Other Ambulatory Visit: Payer: Self-pay | Admitting: Surgery

## 2020-04-21 ENCOUNTER — Encounter
Admission: RE | Admit: 2020-04-21 | Discharge: 2020-04-21 | Disposition: A | Discharge status: Home / Self Care | Payer: PPO | Source: Ambulatory Visit | Attending: Surgery | Admitting: Surgery

## 2020-04-21 ENCOUNTER — Other Ambulatory Visit: Payer: Self-pay

## 2020-04-21 DIAGNOSIS — M1811 Unilateral primary osteoarthritis of first carpometacarpal joint, right hand: Secondary | ICD-10-CM | POA: Diagnosis not present

## 2020-04-21 DIAGNOSIS — Z01818 Encounter for other preprocedural examination: Secondary | ICD-10-CM | POA: Insufficient documentation

## 2020-04-21 DIAGNOSIS — M1712 Unilateral primary osteoarthritis, left knee: Secondary | ICD-10-CM | POA: Diagnosis not present

## 2020-04-21 DIAGNOSIS — M1812 Unilateral primary osteoarthritis of first carpometacarpal joint, left hand: Secondary | ICD-10-CM | POA: Diagnosis not present

## 2020-04-21 HISTORY — DX: Personal history of urinary calculi: Z87.442

## 2020-04-21 LAB — CBC WITH DIFFERENTIAL/PLATELET
Abs Immature Granulocytes: 0.04 10*3/uL (ref 0.00–0.07)
Basophils Absolute: 0 10*3/uL (ref 0.0–0.1)
Basophils Relative: 0 %
Eosinophils Absolute: 0 10*3/uL (ref 0.0–0.5)
Eosinophils Relative: 0 %
HCT: 41.5 % (ref 39.0–52.0)
Hemoglobin: 13.6 g/dL (ref 13.0–17.0)
Immature Granulocytes: 1 %
Lymphocytes Relative: 10 %
Lymphs Abs: 0.8 10*3/uL (ref 0.7–4.0)
MCH: 31.1 pg (ref 26.0–34.0)
MCHC: 32.8 g/dL (ref 30.0–36.0)
MCV: 94.7 fL (ref 80.0–100.0)
Monocytes Absolute: 0.3 10*3/uL (ref 0.1–1.0)
Monocytes Relative: 3 %
Neutro Abs: 7 10*3/uL (ref 1.7–7.7)
Neutrophils Relative %: 86 %
Platelets: 284 10*3/uL (ref 150–400)
RBC: 4.38 MIL/uL (ref 4.22–5.81)
RDW: 13.1 % (ref 11.5–15.5)
WBC: 8.2 10*3/uL (ref 4.0–10.5)
nRBC: 0 % (ref 0.0–0.2)

## 2020-04-21 LAB — TYPE AND SCREEN
ABO/RH(D): A POS
Antibody Screen: NEGATIVE

## 2020-04-21 LAB — COMPREHENSIVE METABOLIC PANEL
ALT: 18 U/L (ref 0–44)
AST: 24 U/L (ref 15–41)
Albumin: 4.3 g/dL (ref 3.5–5.0)
Alkaline Phosphatase: 66 U/L (ref 38–126)
Anion gap: 9 (ref 5–15)
BUN: 19 mg/dL (ref 8–23)
CO2: 27 mmol/L (ref 22–32)
Calcium: 9.6 mg/dL (ref 8.9–10.3)
Chloride: 102 mmol/L (ref 98–111)
Creatinine, Ser: 0.69 mg/dL (ref 0.61–1.24)
GFR, Estimated: >60 mL/min (ref >60)
Glucose, Bld: 109 mg/dL — ABNORMAL HIGH (ref 70–99)
Potassium: 4.1 mmol/L (ref 3.5–5.1)
Sodium: 138 mmol/L (ref 135–145)
Total Bilirubin: 1 mg/dL (ref 0.3–1.2)
Total Protein: 7.4 g/dL (ref 6.5–8.1)

## 2020-04-21 LAB — URINALYSIS, ROUTINE W REFLEX MICROSCOPIC
Bilirubin Urine: NEGATIVE
Glucose, UA: NEGATIVE mg/dL
Hgb urine dipstick: NEGATIVE
Ketones, ur: 20 mg/dL — AB
Leukocytes,Ua: NEGATIVE
Nitrite: NEGATIVE
Protein, ur: NEGATIVE mg/dL
Specific Gravity, Urine: 1.024 (ref 1.005–1.030)
pH: 7 (ref 5.0–8.0)

## 2020-04-21 LAB — SURGICAL PCR SCREEN
MRSA, PCR: NEGATIVE
Staphylococcus aureus: NEGATIVE

## 2020-04-21 NOTE — Patient Instructions (Addendum)
Your procedure is scheduled on:04-29-20 TUESDAY Report to the Registration Desk on the 1st floor of the [facility]-Then proceed to the 2nd floor Surgery Desk in the [facility] To find out your arrival time, please call [phone] between 1PM - 3PM on:04-28-20 MONDAY  REMEMBER: Instructions that are not followed completely may result in serious medical risk, up to and including death; or upon the discretion of your surgeon and anesthesiologist your surgery may need to be rescheduled.  Do not eat food after midnight the night before surgery.  No gum chewing, lozengers or hard candies.  You may however, drink CLEAR liquids up to 2 hours before you are scheduled to arrive for your surgery. Do not drink anything within 2 hours of your scheduled arrival time.  Clear liquids include: - water  - apple juice without pulp - gatorade - black coffee or tea (Do NOT add milk or creamers to the coffee or tea) Do NOT drink anything that is not on this list.  In addition, your doctor has ordered for you to drink the provided  Ensure Pre-Surgery Clear Carbohydrate Drink  Drinking this carbohydrate drink up to two hours before surgery helps to reduce insulin resistance and improve patient outcomes. Please complete drinking 2 hours prior to scheduled arrival time.  TAKE THESE MEDICATIONS THE MORNING OF SURGERY WITH A SIP OF WATER: -NONE  One week prior to surgery: Stop Anti-inflammatories (NSAIDS) such as MOBIC (MELOXICAM), Advil, Aleve, Ibuprofen, Motrin, Naproxen, Naprosyn and Aspirin based products such as Excedrin, Goodys Powder, BC Powder-OK TO TAKE TYLENOL IF NEEDED  Stop ANY OVER THE COUNTER supplements until after surgery. (However, you may continue taking multivitamin up until the day before surgery.)  No Alcohol for 24 hours before or after surgery.  No Smoking including e-cigarettes for 24 hours prior to surgery.  No chewable tobacco products for at least 6 hours prior to surgery.   No nicotine patches on the day of surgery.  Do not use any "recreational" drugs for at least a week prior to your surgery.  Please be advised that the combination of cocaine and anesthesia may have negative outcomes, up to and including death. If you test positive for cocaine, your surgery will be cancelled.  On the morning of surgery brush your teeth with toothpaste and water, you may rinse your mouth with mouthwash if you wish. Do not swallow any toothpaste or mouthwash.  Do not wear jewelry, make-up, hairpins, clips or nail polish.  Do not wear lotions, powders, or perfumes.   Do not shave body from the neck down 48 hours prior to surgery just in case you cut yourself which could leave a site for infection.  Also, freshly shaved skin may become irritated if using the CHG soap.  Contact lenses, hearing aids and dentures may not be worn into surgery.  Do not bring valuables to the hospital. [facility] is not responsible for any missing/lost belongings or valuables.   Use CHG Soap as directed on instruction sheet.   Notify your doctor if there is any change in your medical condition (cold, fever, infection).  Wear comfortable clothing (specific to your surgery type) to the hospital.  Plan for stool softeners for home use; pain medications have a tendency to cause constipation. You can also help prevent constipation by eating foods high in fiber such as fruits and vegetables and drinking plenty of fluids as your diet allows.  After surgery, you can help prevent lung complications by doing breathing exercises.  Take deep breaths and cough every 1-2 hours. Your doctor may order a device called an Incentive Spirometer to help you take deep breaths. When coughing or sneezing, hold a pillow firmly against your incision with both hands. This is called "splinting." Doing this helps protect your incision. It also decreases belly discomfort.  If you are being admitted to the hospital  overnight, leave your suitcase in the car. After surgery it may be brought to your room.  If you are being discharged the day of surgery, you will not be allowed to drive home. You will need a responsible adult (18 years or older) to drive you home and stay with you that night.   If you are taking public transportation, you will need to have a responsible adult (18 years or older) with you. Please confirm with your physician that it is acceptable to use public transportation.   Please call the [facility] at [phone] if you have any questions about these instructions.  Visitation Policy:  Patients undergoing a surgery or procedure may have one family member or support person with them as long as that person is not COVID-19 positive or experiencing its symptoms.  That person may remain in the waiting area during the procedure.  Inpatient Visitation:    Visiting hours are 7 a.m. to 8 p.m. Patients will be allowed one visitor. The visitor may change daily. The visitor must pass COVID-19 screenings, use hand sanitizer when entering and exiting the patient's room and wear a mask at all times, including in the patient's room. Patients must also wear a mask when staff or their visitor are in the room. Masking is required regardless of vaccination status. Systemwide, no visitors 17 or younger.

## 2020-04-25 ENCOUNTER — Other Ambulatory Visit
Admission: RE | Admit: 2020-04-25 | Discharge: 2020-04-25 | Disposition: A | Discharge status: Home / Self Care | Payer: PPO | Source: Ambulatory Visit | Attending: Surgery | Admitting: Surgery

## 2020-04-25 ENCOUNTER — Other Ambulatory Visit: Payer: Self-pay

## 2020-04-25 DIAGNOSIS — Z01812 Encounter for preprocedural laboratory examination: Secondary | ICD-10-CM | POA: Diagnosis not present

## 2020-04-25 DIAGNOSIS — Z20822 Contact with and (suspected) exposure to covid-19: Secondary | ICD-10-CM | POA: Diagnosis not present

## 2020-04-25 LAB — SARS CORONAVIRUS 2 (TAT 6-24 HRS): SARS Coronavirus 2: NEGATIVE

## 2020-04-29 ENCOUNTER — Ambulatory Visit: Payer: PPO

## 2020-04-29 ENCOUNTER — Other Ambulatory Visit: Payer: Self-pay

## 2020-04-29 ENCOUNTER — Ambulatory Visit
Admission: RE | Admit: 2020-04-29 | Discharge: 2020-04-29 | Disposition: A | Discharge status: Home / Self Care | Payer: PPO | Attending: Surgery | Admitting: Surgery

## 2020-04-29 ENCOUNTER — Ambulatory Visit: Payer: PPO | Admitting: Anesthesiology

## 2020-04-29 ENCOUNTER — Encounter: Payer: Self-pay | Admitting: Surgery

## 2020-04-29 ENCOUNTER — Encounter
Admission: RE | Disposition: A | Discharge status: Home / Self Care | Payer: Self-pay | Source: Home / Self Care | Attending: Surgery

## 2020-04-29 DIAGNOSIS — Z96652 Presence of left artificial knee joint: Secondary | ICD-10-CM

## 2020-04-29 DIAGNOSIS — M1712 Unilateral primary osteoarthritis, left knee: Secondary | ICD-10-CM | POA: Diagnosis not present

## 2020-04-29 DIAGNOSIS — Z7982 Long term (current) use of aspirin: Secondary | ICD-10-CM | POA: Insufficient documentation

## 2020-04-29 DIAGNOSIS — Z471 Aftercare following joint replacement surgery: Secondary | ICD-10-CM | POA: Diagnosis not present

## 2020-04-29 DIAGNOSIS — Z791 Long term (current) use of non-steroidal anti-inflammatories (NSAID): Secondary | ICD-10-CM | POA: Insufficient documentation

## 2020-04-29 DIAGNOSIS — Z79899 Other long term (current) drug therapy: Secondary | ICD-10-CM | POA: Insufficient documentation

## 2020-04-29 DIAGNOSIS — Z87891 Personal history of nicotine dependence: Secondary | ICD-10-CM | POA: Diagnosis not present

## 2020-04-29 HISTORY — PX: TOTAL KNEE ARTHROPLASTY: SHX125

## 2020-04-29 LAB — ABO/RH: ABO/RH(D): A POS

## 2020-04-29 SURGERY — ARTHROPLASTY, KNEE, TOTAL
Anesthesia: Monitor Anesthesia Care | Site: Knee | Laterality: Left

## 2020-04-29 MED ORDER — ACETAMINOPHEN 10 MG/ML IV SOLN: INTRAVENOUS | Status: DC | PRN

## 2020-04-29 MED ORDER — BUPIVACAINE LIPOSOME 1.3 % IJ SUSP
INTRAMUSCULAR | Status: AC
  Filled 2020-04-29: qty 20

## 2020-04-29 MED ORDER — KETOROLAC TROMETHAMINE 15 MG/ML IJ SOLN: 15.0000 mg | Freq: Once | INTRAMUSCULAR | Status: AC

## 2020-04-29 MED ORDER — BUPIVACAINE-EPINEPHRINE (PF) 0.5% -1:200000 IJ SOLN
INTRAMUSCULAR | Status: DC | PRN
  Administered 2020-04-29: 30 mL via PERINEURAL

## 2020-04-29 MED ORDER — ACETAMINOPHEN 10 MG/ML IV SOLN
INTRAVENOUS | Status: AC
  Filled 2020-04-29: qty 100

## 2020-04-29 MED ORDER — ONDANSETRON HCL 4 MG/2ML IJ SOLN: 4.0000 mg | Freq: Four times a day (QID) | INTRAMUSCULAR | Status: DC | PRN

## 2020-04-29 MED ORDER — ACETAMINOPHEN 10 MG/ML IV SOLN
INTRAVENOUS | Status: AC
  Administered 2020-04-29: 1000 mg via INTRAVENOUS
  Filled 2020-04-29: qty 100

## 2020-04-29 MED ORDER — ACETAMINOPHEN 500 MG PO TABS
ORAL_TABLET | ORAL | Status: AC
  Administered 2020-04-29: 1000 mg via ORAL
  Filled 2020-04-29: qty 2

## 2020-04-29 MED ORDER — FENTANYL CITRATE (PF) 100 MCG/2ML IJ SOLN: 25.0000 ug | INTRAMUSCULAR | Status: DC | PRN

## 2020-04-29 MED ORDER — BUPIVACAINE HCL (PF) 0.5 % IJ SOLN
INTRAMUSCULAR | Status: AC
  Filled 2020-04-29: qty 30

## 2020-04-29 MED ORDER — MIDAZOLAM HCL 5 MG/5ML IJ SOLN
INTRAMUSCULAR | Status: DC | PRN
  Administered 2020-04-29 (×2): 1 mg via INTRAVENOUS

## 2020-04-29 MED ORDER — METOCLOPRAMIDE HCL 5 MG/ML IJ SOLN: 5.0000 mg | Freq: Three times a day (TID) | INTRAMUSCULAR | Status: DC | PRN

## 2020-04-29 MED ORDER — PROPOFOL 10 MG/ML IV BOLUS
INTRAVENOUS | Status: AC
  Filled 2020-04-29: qty 20

## 2020-04-29 MED ORDER — ACETAMINOPHEN 500 MG PO TABS: 1000.0000 mg | ORAL_TABLET | Freq: Four times a day (QID) | ORAL | Status: DC

## 2020-04-29 MED ORDER — PROPOFOL 500 MG/50ML IV EMUL
INTRAVENOUS | Status: DC | PRN
  Administered 2020-04-29: 50 ug/kg/min via INTRAVENOUS

## 2020-04-29 MED ORDER — SODIUM CHLORIDE 0.9 % BOLUS PEDS
250.0000 mL | Freq: Once | INTRAVENOUS | Status: AC
  Administered 2020-04-29: 250 mL via INTRAVENOUS

## 2020-04-29 MED ORDER — CEFAZOLIN SODIUM-DEXTROSE 2-4 GM/100ML-% IV SOLN
2.0000 g | INTRAVENOUS | Status: AC
  Administered 2020-04-29: 2 g via INTRAVENOUS

## 2020-04-29 MED ORDER — BUPIVACAINE HCL (PF) 0.5 % IJ SOLN
INTRAMUSCULAR | Status: DC | PRN
  Administered 2020-04-29: 2.8 mL

## 2020-04-29 MED ORDER — TRANEXAMIC ACID 1000 MG/10ML IV SOLN
INTRAVENOUS | Status: DC | PRN
  Administered 2020-04-29: 1000 mg via TOPICAL

## 2020-04-29 MED ORDER — SODIUM CHLORIDE 0.9 % IV SOLN
INTRAVENOUS | Status: DC | PRN
  Administered 2020-04-29: 60 mL

## 2020-04-29 MED ORDER — CEFAZOLIN SODIUM-DEXTROSE 2-4 GM/100ML-% IV SOLN
INTRAVENOUS | Status: AC
  Filled 2020-04-29: qty 100

## 2020-04-29 MED ORDER — OXYCODONE HCL 5 MG PO TABS
5.0000 mg | ORAL_TABLET | ORAL | Status: DC | PRN
  Administered 2020-04-29: 5 mg via ORAL

## 2020-04-29 MED ORDER — MEPERIDINE HCL 50 MG/ML IJ SOLN: 6.2500 mg | INTRAMUSCULAR | Status: DC | PRN

## 2020-04-29 MED ORDER — ONDANSETRON HCL 4 MG/2ML IJ SOLN: 4.0000 mg | Freq: Once | INTRAMUSCULAR | Status: DC | PRN

## 2020-04-29 MED ORDER — ACETAMINOPHEN 10 MG/ML IV SOLN: 1000.0000 mg | Freq: Once | INTRAVENOUS | Status: AC

## 2020-04-29 MED ORDER — CHLORHEXIDINE GLUCONATE 0.12 % MT SOLN
OROMUCOSAL | Status: AC
  Administered 2020-04-29: 15 mL via OROMUCOSAL
  Filled 2020-04-29: qty 15

## 2020-04-29 MED ORDER — KETOROLAC TROMETHAMINE 15 MG/ML IJ SOLN
INTRAMUSCULAR | Status: AC
  Filled 2020-04-29: qty 1

## 2020-04-29 MED ORDER — TRANEXAMIC ACID 1000 MG/10ML IV SOLN
INTRAVENOUS | Status: AC
  Filled 2020-04-29: qty 10

## 2020-04-29 MED ORDER — OXYCODONE HCL 5 MG PO TABS
ORAL_TABLET | ORAL | Status: AC
  Filled 2020-04-29: qty 1

## 2020-04-29 MED ORDER — CEFAZOLIN SODIUM-DEXTROSE 2-4 GM/100ML-% IV SOLN: 2.0000 g | Freq: Four times a day (QID) | INTRAVENOUS | Status: DC

## 2020-04-29 MED ORDER — FENTANYL CITRATE (PF) 100 MCG/2ML IJ SOLN
INTRAMUSCULAR | Status: DC | PRN
  Administered 2020-04-29 (×2): 50 ug via INTRAVENOUS

## 2020-04-29 MED ORDER — OXYCODONE HCL 5 MG PO TABS: 5.0000 mg | ORAL_TABLET | ORAL | 0 refills | Status: DC | PRN

## 2020-04-29 MED ORDER — LACTATED RINGERS IV SOLN: INTRAVENOUS | Status: DC

## 2020-04-29 MED ORDER — ONDANSETRON HCL 4 MG PO TABS: 4.0000 mg | ORAL_TABLET | Freq: Four times a day (QID) | ORAL | Status: DC | PRN

## 2020-04-29 MED ORDER — FAMOTIDINE 20 MG PO TABS
ORAL_TABLET | ORAL | Status: AC
  Administered 2020-04-29: 20 mg via ORAL
  Filled 2020-04-29: qty 1

## 2020-04-29 MED ORDER — MIDAZOLAM HCL 2 MG/2ML IJ SOLN
INTRAMUSCULAR | Status: AC
  Filled 2020-04-29: qty 2

## 2020-04-29 MED ORDER — CHLORHEXIDINE GLUCONATE 0.12 % MT SOLN: 15.0000 mL | Freq: Once | OROMUCOSAL | Status: AC

## 2020-04-29 MED ORDER — ORAL CARE MOUTH RINSE: 15.0000 mL | Freq: Once | OROMUCOSAL | Status: AC

## 2020-04-29 MED ORDER — KETOROLAC TROMETHAMINE 15 MG/ML IJ SOLN
INTRAMUSCULAR | Status: AC
  Administered 2020-04-29: 15 mg via INTRAVENOUS
  Filled 2020-04-29: qty 1

## 2020-04-29 MED ORDER — KETOROLAC TROMETHAMINE 15 MG/ML IJ SOLN
7.5000 mg | Freq: Four times a day (QID) | INTRAMUSCULAR | Status: DC
  Administered 2020-04-29: 7.5 mg via INTRAVENOUS

## 2020-04-29 MED ORDER — METOCLOPRAMIDE HCL 10 MG PO TABS
5.0000 mg | ORAL_TABLET | Freq: Three times a day (TID) | ORAL | Status: DC | PRN
Start: 2020-04-29 — End: 2020-04-29

## 2020-04-29 MED ORDER — FENTANYL CITRATE (PF) 100 MCG/2ML IJ SOLN
INTRAMUSCULAR | Status: AC
  Filled 2020-04-29: qty 2

## 2020-04-29 MED ORDER — SODIUM CHLORIDE 0.9 % IV SOLN: INTRAVENOUS | Status: DC

## 2020-04-29 MED ORDER — SODIUM CHLORIDE (PF) 0.9 % IJ SOLN
INTRAMUSCULAR | Status: AC
  Filled 2020-04-29: qty 50

## 2020-04-29 MED ORDER — SODIUM CHLORIDE FLUSH 0.9 % IV SOLN
INTRAVENOUS | Status: AC
  Filled 2020-04-29: qty 40

## 2020-04-29 MED ORDER — FAMOTIDINE 20 MG PO TABS: 20.0000 mg | ORAL_TABLET | Freq: Once | ORAL | Status: AC

## 2020-04-29 MED ORDER — CEFAZOLIN SODIUM-DEXTROSE 2-4 GM/100ML-% IV SOLN
INTRAVENOUS | Status: AC
  Administered 2020-04-29: 2 g via INTRAVENOUS
  Filled 2020-04-29: qty 100

## 2020-04-29 MED ORDER — APIXABAN 2.5 MG PO TABS: 2.5000 mg | ORAL_TABLET | Freq: Two times a day (BID) | ORAL | 0 refills | Status: DC

## 2020-04-29 SURGICAL SUPPLY — 63 items
APL PRP STRL LF DISP 70% ISPRP (MISCELLANEOUS) ×1
BEAR TIBIA 71/75W 10 THICK (Insert) ×2 IMPLANT
BEARING TIBIA 71/75W 10 THICK (Insert) ×1 IMPLANT
BLADE SAW SAG 25X90X1.19 (BLADE) ×2 IMPLANT
BLADE SURG SZ20 CARB STEEL (BLADE) ×2 IMPLANT
BNDG CMPR STD VLCR NS LF 5.8X6 (GAUZE/BANDAGES/DRESSINGS) ×1
BNDG ELASTIC 6X5.8 VLCR NS LF (GAUZE/BANDAGES/DRESSINGS) ×2 IMPLANT
BRNG TIB 71/75X10 CRCTE RTN (Insert) ×1 IMPLANT
CANISTER SUCT 1200ML W/VALVE (MISCELLANEOUS) ×2 IMPLANT
CANISTER SUCT 3000ML PPV (MISCELLANEOUS) ×2 IMPLANT
CEMENT BONE R 1X40 (Cement) ×4 IMPLANT
CEMENT VACUUM MIXING SYSTEM (MISCELLANEOUS) ×2 IMPLANT
CHLORAPREP W/TINT 26 (MISCELLANEOUS) ×2 IMPLANT
COOLER POLAR GLACIER W/PUMP (MISCELLANEOUS) ×2 IMPLANT
COVER MAYO STAND REUSABLE (DRAPES) ×2 IMPLANT
COVER WAND RF STERILE (DRAPES) ×2 IMPLANT
CUFF TOURN SGL QUICK 24 (TOURNIQUET CUFF)
CUFF TOURN SGL QUICK 30 (TOURNIQUET CUFF) ×2
CUFF TRNQT CYL 24X4X16.5-23 (TOURNIQUET CUFF) IMPLANT
CUFF TRNQT CYL 30X4X21-28X (TOURNIQUET CUFF) ×1 IMPLANT
DRAPE 3/4 80X56 (DRAPES) ×2 IMPLANT
DRAPE IMP U-DRAPE 54X76 (DRAPES) ×2 IMPLANT
DRSG MEPILEX SACRM 8.7X9.8 (GAUZE/BANDAGES/DRESSINGS) ×2 IMPLANT
DRSG OPSITE POSTOP 4X10 (GAUZE/BANDAGES/DRESSINGS) ×2 IMPLANT
DRSG OPSITE POSTOP 4X8 (GAUZE/BANDAGES/DRESSINGS) IMPLANT
ELECT REM PT RETURN 9FT ADLT (ELECTROSURGICAL) ×2
ELECTRODE REM PT RTRN 9FT ADLT (ELECTROSURGICAL) ×1 IMPLANT
FEMORAL CR LEFT 65MM (Joint) ×2 IMPLANT
GLOVE BIO SURGEON STRL SZ7.5 (GLOVE) ×8 IMPLANT
GLOVE BIO SURGEON STRL SZ8 (GLOVE) ×8 IMPLANT
GLOVE INDICATOR 8.0 STRL GRN (GLOVE) ×2 IMPLANT
GLOVE SRG 8 PF TXTR STRL LF DI (GLOVE) ×1 IMPLANT
GLOVE SURG UNDER POLY LF SZ8 (GLOVE) ×2
GOWN STRL REUS W/ TWL LRG LVL3 (GOWN DISPOSABLE) ×1 IMPLANT
GOWN STRL REUS W/ TWL XL LVL3 (GOWN DISPOSABLE) ×1 IMPLANT
GOWN STRL REUS W/TWL LRG LVL3 (GOWN DISPOSABLE) ×2
GOWN STRL REUS W/TWL XL LVL3 (GOWN DISPOSABLE) ×2
HOOD PEEL AWAY FLYTE STAYCOOL (MISCELLANEOUS) ×6 IMPLANT
KIT TURNOVER KIT A (KITS) ×2 IMPLANT
MANIFOLD NEPTUNE II (INSTRUMENTS) ×2 IMPLANT
NEEDLE SPNL 20GX3.5 QUINCKE YW (NEEDLE) ×2 IMPLANT
NS IRRIG 1000ML POUR BTL (IV SOLUTION) ×2 IMPLANT
PACK TOTAL KNEE (MISCELLANEOUS) ×2 IMPLANT
PAD WRAPON POLAR KNEE (MISCELLANEOUS) ×1 IMPLANT
PATELLA STD 34X8.5 (Orthopedic Implant) ×2 IMPLANT
PENCIL SMOKE EVACUATOR (MISCELLANEOUS) ×2 IMPLANT
PLATE KNEE TIBIAL 71MM FIXED (Plate) ×2 IMPLANT
PULSAVAC PLUS IRRIG FAN TIP (DISPOSABLE) ×2
SOL .9 NS 3000ML IRR  AL (IV SOLUTION) ×1
SOL .9 NS 3000ML IRR AL (IV SOLUTION) ×1
SOL .9 NS 3000ML IRR UROMATIC (IV SOLUTION) ×1 IMPLANT
STAPLER SKIN PROX 35W (STAPLE) ×2 IMPLANT
SUCTION FRAZIER HANDLE 10FR (MISCELLANEOUS) ×1
SUCTION TUBE FRAZIER 10FR DISP (MISCELLANEOUS) ×1 IMPLANT
SUT VIC AB 0 CT1 36 (SUTURE) ×6 IMPLANT
SUT VIC AB 2-0 CT1 27 (SUTURE) ×6
SUT VIC AB 2-0 CT1 TAPERPNT 27 (SUTURE) ×3 IMPLANT
SYR 10ML LL (SYRINGE) ×2 IMPLANT
SYR 20ML LL LF (SYRINGE) ×2 IMPLANT
SYR 30ML LL (SYRINGE) IMPLANT
TIP FAN IRRIG PULSAVAC PLUS (DISPOSABLE) ×1 IMPLANT
WATER STERILE IRR 1000ML POUR (IV SOLUTION) ×2 IMPLANT
WRAPON POLAR PAD KNEE (MISCELLANEOUS) ×2

## 2020-04-29 NOTE — Anesthesia Procedure Notes (Addendum)
Spinal  Patient location during procedure: OR Start time: 04/29/2020 7:33 AM Staffing Performed: anesthesiologist  Anesthesiologist: Phill Mutter, MD Preanesthetic Checklist Completed: patient identified, IV checked, site marked, risks and benefits discussed, surgical consent, monitors and equipment checked, pre-op evaluation and timeout performed Spinal Block Patient position: sitting Prep: DuraPrep Patient monitoring: heart rate, cardiac monitor, continuous pulse ox and blood pressure Approach: midline Location: L3-4 Injection technique: single-shot Needle Needle type: Sprotte  Needle gauge: 24 G Needle length: 9 cm Assessment Sensory level: T4 Additional Notes - parathesia, - heme, + csf brisk and clear csf flow, pt tolerated procedure well.

## 2020-04-29 NOTE — Anesthesia Postprocedure Evaluation (Signed)
Anesthesia Post Note  Patient: Joren Rehm [place]  Procedure(s) Performed: TOTAL KNEE ARTHROPLASTY (Left Knee)  Patient location during evaluation: PACU Anesthesia Type: MAC and Spinal Level of consciousness: awake, awake and alert and oriented Pain management: pain level controlled Vital Signs Assessment: post-procedure vital signs reviewed and stable Respiratory status: spontaneous breathing, nonlabored ventilation and respiratory function stable Cardiovascular status: blood pressure returned to baseline Postop Assessment: no apparent nausea or vomiting Anesthetic complications: no   No complications documented.   Last Vitals:  Vitals:   04/29/20 1028 04/29/20 1045  BP: 139/75 135/70  Pulse: (!) 58 (!) 49  Resp: 15 12  Temp:    SpO2: 100% 100%    Last Pain:  Vitals:   04/29/20 1045  TempSrc:   PainSc: 0-No pain    LLE Motor Response: Purposeful movement (04/29/20 1045)   RLE Motor Response: Purposeful movement (04/29/20 1045)        Phill Mutter

## 2020-04-29 NOTE — Anesthesia Preprocedure Evaluation (Addendum)
Anesthesia Evaluation  Patient identified by MRN, date of birth, ID band Patient awake    Reviewed: Allergy & Precautions, H&P , NPO status , Patient's Chart, lab work & pertinent test results  Airway Mallampati: II  TM Distance: >3 FB Neck ROM: Full    Dental  (+) Edentulous Upper, Edentulous Lower   Pulmonary neg pulmonary ROS, former smoker,    Pulmonary exam normal        Cardiovascular negative cardio ROS Normal cardiovascular exam     Neuro/Psych PSYCHIATRIC DISORDERS Depression  Neuromuscular disease    GI/Hepatic Neg liver ROS, PUD, GERD  Controlled,  Endo/Other  negative endocrine ROS  Renal/GU Renal disease  negative genitourinary   Musculoskeletal  (+) Arthritis , Osteoarthritis,    Abdominal Normal abdominal exam  (+)   Peds negative pediatric ROS (+)  Hematology negative hematology ROS (+)   Anesthesia Other Findings Denies CV Dz PUD, GERD Prostate CA  Reproductive/Obstetrics negative OB ROS                            Anesthesia Physical Anesthesia Plan  ASA: II  Anesthesia Plan: MAC and Spinal   Post-op Pain Management:    Induction: Intravenous  PONV Risk Score and Plan: 2  Airway Management Planned: Mask  Additional Equipment:   Intra-op Plan:   Post-operative Plan:   Informed Consent: I have reviewed the patients History and Physical, chart, labs and discussed the procedure including the risks, benefits and alternatives for the proposed anesthesia with the patient or authorized representative who has indicated his/her understanding and acceptance.       Plan Discussed with: CRNA, Anesthesiologist and Surgeon  Anesthesia Plan Comments:         Anesthesia Quick Evaluation

## 2020-04-29 NOTE — Op Note (Signed)
04/29/2020  9:42 AM  Patient:   Benjamin Mills  Pre-Op Diagnosis:   Degenerative joint disease, left knee.  Post-Op Diagnosis:   Same  Procedure:   Left TKA using all-cemented Bi[employer] Vanguard system with a 65 mm PCR femur, a 71 mm tibial tray with a 10 mm E-poly insert, and a 34 x 8.5 mm all-poly 3-pegged domed patella.  Surgeon:   Pascal Lux, MD  Assistant:   Cameron Proud, PA-C   Anesthesia:   Spinal  Findings:   As above  Complications:   None  EBL:   10 cc  Fluids:   500 cc crystalloid  UOP:   None cc  TT:   95 minutes at 300 mmHg  Drains:   None  Closure:   Staples  Implants:   As above  Brief Clinical Note:   The patient is a 75 year old male with a long history of progressively worsening left knee pain. The patient's symptoms have progressed despite medications, activity modification, injections, etc. The patient's history and examination were consistent with advanced degenerative joint disease of the left knee confirmed by plain radiographs. The patient presents at this time for a left total knee arthroplasty.  Procedure:   The patient was brought into the operating room. After adequate spinal anesthesia was obtained, the patient was lain in the supine position before the left lower extremity was prepped with ChloraPrep solution and draped sterilely. Preoperative antibiotics were administered. After verifying the proper laterality with a surgical timeout, the limb was exsanguinated with an Esmarch and the tourniquet inflated to 300 mmHg. A standard anterior approach to the knee was made through an approximately 7 inch incision. The incision was carried down through the subcutaneous tissues to expose superficial retinaculum. This was split the length of the incision and the medial flap elevated sufficiently to expose the medial retinaculum. The medial retinaculum was incised, leaving a 3-4 mm cuff of tissue on the patella. This was extended distally along the medial  border of the patellar tendon and proximally through the medial third of the quadriceps tendon. A subtotal fat pad excision was performed before the soft tissues were elevated off the anteromedial and anterolateral aspects of the proximal tibia to the level of the collateral ligaments. The anterior portions of the medial and lateral menisci were removed, as was the anterior cruciate ligament. With the knee flexed to 90, the external tibial guide was positioned and the appropriate proximal tibial cut made. This piece was taken to the back table where it was measured and found to be optimally replicated by a 71 mm component.  Attention was directed to the distal femur. The intramedullary canal was accessed through a 3/8" drill hole. The intramedullary guide was inserted and positioned in order to obtain a neutral flexion gap. The intercondylar block was positioned with care taken to avoid notching the anterior cortex of the femur. The appropriate cut was made. Next, the distal cutting block was placed at 5 of valgus alignment. Using the 9 mm slot, the distal cut was made. The distal femur was measured and found to be optimally replicated by the 65 mm component. The 65 mm 4-in-1 cutting block was positioned and first the posterior, then the posterior chamfer, the anterior chamfer, and finally the anterior cuts were made. At this point, the posterior portions medial and lateral menisci were removed. A trial reduction was performed using the appropriate femoral and tibial components with the 10 mm insert. This demonstrated excellent stability to varus  and valgus stressing both in flexion and extension while permitting full extension. Patella tracking was assessed and found to be excellent. Therefore, the tibial guide position was marked on the proximal tibia. The patella thickness was measured and found to be 21 mm. Therefore, the appropriate cut was made. The patellar surface was measured and found to be optimally  replicated by the 34 mm component. The three peg holes were drilled in place before the trial button was inserted. Patella tracking was assessed and found to be excellent, passing the "no thumb test". The lug holes were drilled into the distal femur before the trial component was removed, leaving only the tibial tray. The keel was then created using the appropriate tower, reamer, and punch.  The bony surfaces were prepared for cementing by irrigating them thoroughly with sterile saline solution via the jet lavage system. A bone plug was fashioned from some of the bone that had been removed previously and used to plug the distal femoral canal. In addition, 20 cc of Exparel diluted out to 60 cc with normal saline and 30 cc of 0.5% Sensorcaine were injected into the postero-medial and postero-lateral aspects of the knee, the medial and lateral gutter regions, and the peri-incisional tissues to help with postoperative analgesia. Meanwhile, the cement was being mixed on the back table. When it was ready, the tibial tray was cemented in first. The excess cement was removed using [employer] fast streamer. Next, the femoral component was impacted into place. Again, the excess cement was removed using [employer] fast streamer. The 10 mm trial insert was positioned and the knee brought into extension while the cement hardened. Finally, the patella was cemented into place and secured using the patellar clamp. Again, the excess cement was removed using [employer] fast streamer. Once the cement had hardened, the knee was placed through a range of motion with the findings as described above. Therefore, the trial insert was removed and, after verifying that no cement had been retained posteriorly, the permanent 10 mm E-polyethylene insert was positioned and secured using the appropriate key locking mechanism. Again the knee was placed through a range of motion with the findings as described above.  The wound was copiously irrigated with sterile saline  solution using the jet lavage system before the quadriceps tendon and retinacular layer were reapproximated using #0 Vicryl interrupted sutures. The superficial retinacular layer also was closed using a running #0 Vicryl suture. A total of 10 cc of transexemic acid (TXA) was injected intra-articularly before the subcutaneous tissues were closed in several layers using 2-0 Vicryl interrupted sutures. The skin was closed using staples. A sterile honeycomb dressing was applied to the skin before the leg was wrapped with an Ace wrap to accommodate the Polar Care device. The patient was then awakened and returned to the recovery room in satisfactory condition after tolerating the procedure well.

## 2020-04-29 NOTE — Transfer of Care (Signed)
Immediate Anesthesia Transfer of Care Note  Patient: Benjamin Mills  Procedure(s) Performed: TOTAL KNEE ARTHROPLASTY (Left Knee)  Patient Location: PACU  Anesthesia Type:Spinal  Level of Consciousness: awake, alert  and oriented  Airway & Oxygen Therapy: Patient Spontanous Breathing  Post-op Assessment: Report given to RN and Post -op Vital signs reviewed and stable  Post vital signs: Reviewed and stable  Last Vitals:  Vitals Value Taken Time  BP    Temp    Pulse    Resp    SpO2      Last Pain:  Vitals:   04/29/20 0624  TempSrc: Tympanic  PainSc: 0-No pain         Complications: No complications documented.

## 2020-04-29 NOTE — Discharge Instructions (Addendum)
AMBULATORY SURGERY  DISCHARGE INSTRUCTIONS   1) The drugs that you were given will stay in your system until tomorrow so for the next 24 hours you should not:  A) Drive an automobile B) Make any legal decisions C) Drink any alcoholic beverage   2) You may resume regular meals tomorrow.  Today it is better to start with liquids and gradually work up to solid foods.  You may eat anything you prefer, but it is better to start with liquids, then soup and crackers, and gradually work up to solid foods.   3) Please notify your doctor immediately if you have any unusual bleeding, trouble breathing, redness and pain at the surgery site, drainage, fever, or pain not relieved by medication. 4)   5) Your post-operative visit with Dr.                                     is: Date:                        Time:    Please call to schedule your post-operative visit.  6) Additional Instructions:     Orthopedic discharge instructions: May shower with intact op-site dressing. Apply ice frequently to knee or use Polar Care. Take Eliquis 2.5 mg BID for 2 weeks, then Aspirin 325 mg daily for 4 weeks. Take oxycodone as prescribed when needed.  May supplement with ES Tylenol if necessary. May weight-bear as tolerated on left leg - use walker as needed for balance and support. Follow-up in 10-14 days or as scheduled.

## 2020-04-29 NOTE — H&P (Signed)
History of Present Illness:  Benjamin Mills is a 75 y.o. male who presents for a history and physical in anticipation of his upcoming left total knee arthroplasty for underlying degenerative joint disease of the left knee. The patient notes little change in his symptoms since his last visit 2 months ago. He continues to experience pain which is exacerbated by any prolonged standing or ambulation, as well as with ascending/descending stairs. He denies any reinjury to the knee, and denies any fevers or chills. He continues to take Mobic on a daily basis with limited benefit.  Current Outpatient Medications: . aspirin 81 MG EC tablet Take 81 mg by mouth once daily  . DULoxetine (CYMBALTA) 60 MG DR capsule Take 1 capsule by mouth once daily  . meloxicam (MOBIC) 7.5 MG tablet Take 1 tablet by mouth 2 (two) times daily  . multivit with minerals/lutein (MULTIVITAMIN 50 PLUS ORAL) Take 1 tablet by mouth once daily  . triamcinolone 0.1 % ointment Apply topically as needed   Allergies: No Known Allergies  Past Medical History:  . Amputation finger (left 5th finger)  . Cancer (CMS-HCC) - prostate, with radical prostate removal  . Depression  . Diverticulitis  . Gastrointestinal ulcer  . GERD (gastroesophageal reflux disease)  . Hyperlipidemia  . Spinal stenosis at L4-L5 level   Past Surgical History:  . AMPUTATION FINGER / THUMB (left 5th finger)  . left knee arthroscopy Left 1969  . removal of prostate  . spenectomy  . SPLENECTOMY 1953   Family History:  . Dementia Mother  . Glaucoma Mother  . Alcohol abuse Father  . Multiple sclerosis Brother   Social History:   Socioeconomic History:  Marland Kitchen Marital status: Married  Spouse name: Not on file  . Number of children: Not on file  . Years of education: Not on file  . Highest education level: Not on file  Occupational History  . Not on file  Tobacco Use  . Smoking status: Former Smoker  Quit date: 1980  Years since quitting: 42.0  .  Smokeless tobacco: Never Used  Substance and Sexual Activity  . Alcohol use: Yes  . Drug use: Never  . Sexual activity: Defer  Other Topics Concern  . Not on file  Social History Narrative  . Not on file   Social Determinants of Health:   Financial Resource Strain: Not on file  Food Insecurity: Not on file  Transportation Needs: Not on file   Review of Systems:  A comprehensive 14 point ROS was performed, reviewed, and the pertinent orthopaedic findings are documented in the HPI.  Physical Exam: Vitals:  04/21/20 0855  BP: (!) 142/92  Weight: 86.8 kg (191 lb 6.4 oz)  Height: 172.7 cm (5\' 8" )  PainSc: 2  PainLoc: Finger   General/Constitutional: The patient appears to be well-nourished, well-developed, and in no acute distress. Neuro/Psych: Normal mood and affect, oriented to person, place and time.  Eyes: Non-icteric. Pupils are equal, round, and reactive to light, and exhibit synchronous movement. ENT: Unremarkable. Lymphatic: No palpable adenopathy. Respiratory: Lungs clear to auscultation, Normal chest excursion, No wheezes and Non-labored breathing Cardiovascular: Regular rate and rhythm. No murmurs. and No edema, swelling or tenderness, except as noted in detailed exam. Integumentary: No impressive skin lesions present, except as noted in detailed exam. Musculoskeletal: Unremarkable, except as noted in detailed exam.   Leftknee exam: GAIT:Minimal limp, but uses no assistive devices. ALIGNMENT:Mild varus SKIN:Well-healed surgical incisions, otherwise unremarkable. SWELLING:Minimal EFFUSION:Trace WARMTH:None TENDERNESS:Mild tendernessalong medial joint line ROM:0-105  degrees with mild pain in maximal flexion McMURRAY'S:Equivocally positive PATELLOFEMORAL:Normal tracking with no peri-patellar tenderness and negative apprehension sign CREPITUS:None LACHMAN'S:1+ PIVOT SHIFT:Not evaluated ANTERIOR DRAWER:Trace positive POSTERIOR  DRAWER:Negative VARUS/VALGUS:Positive pseudolaxity to varus stressing  He is neurovascularly intact to the left lower extremity and foot.  Assessment: Primary osteoarthritis of left knee.   Plan: The treatment options were discussed with the patient. In addition, patient educational materials were provided regarding the diagnosis and treatment options. Regarding his left knee, the patient is ready to proceed with the left total knee arthroplasty as scheduled. The procedure was discussed with the patient, as were the potential risks (including bleeding, infection, nerve and/or blood vessel injury, persistent or recurrent pain, loosening and/or failure of the components, dislocation, need for further surgery, blood clots, strokes, heart attacks and/or arhythmias, pneumonia, etc.) and benefits. The patient states his/her understanding and wishes to proceed. All of the patient's questions and concerns were answered. He can call any time with further concerns. He will follow up post-surgery, routine.   H&P reviewed and patient re-examined. No changes.

## 2020-04-29 NOTE — Evaluation (Signed)
Physical Therapy Evaluation Patient Details Name: Benjamin Mills MRN: [id number] DOB: 01/08/1946 Today's Date: 04/29/2020   History of Present Illness  75 y/o male s/p L TKA 04/30/19.  Clinical Impression  Pt did well with exercises showing reasonable quad control, ability to do SLRs w/o assist and had ~90 deg of flexion.  He ambulated safely and with good confidence and overall did quite well.  He has a walker and BSC at home, understands HEP.  PT educated and answered all questions, safe to d/c home today from PT perspective.    Follow Up Recommendations Home health PT;Follow surgeon's recommendation for DC plan and follow-up therapies    Equipment Recommendations  None recommended by PT    Recommendations for Other Services       Precautions / Restrictions Precautions Precautions: Fall Restrictions Weight Bearing Restrictions: Yes LLE Weight Bearing: Weight bearing as tolerated      Mobility  Bed Mobility               General bed mobility comments: in recliner on arrival    Transfers Overall transfer level: Independent Equipment used: Rolling walker (2 wheeled)                Ambulation/Gait Ambulation/Gait assistance: Modified independent (Device/Increase time) Gait Distance (Feet): 150 Feet Assistive device: Rolling walker (2 wheeled)       General Gait Details: Pt is able to ambulate with good confidence and speed and though he lacked TKE during heel strike he has no stagger stepping or other safety issues  Stairs Stairs: Yes Stairs assistance: Supervision Stair Management: Two rails Number of Stairs: 4 General stair comments: Pt easily, safely and confidently negotiated up/down 4 steps  Wheelchair Mobility    Modified Rankin (Stroke Patients Only)       Balance Overall balance assessment: Independent                                           Pertinent Vitals/Pain Pain Assessment: 0-10 Pain Score: 5  Pain  Location: L knee    Home Living Family/patient expects to be discharged to:: Private residence Living Arrangements: Spouse/significant other Available Help at Discharge: Family;Available 24 hours/day   Home Access: Ramped entrance     Home Layout: One level Home Equipment: Walker - 2 wheels;Bedside commode      Prior Function Level of Independence: Independent         Comments: Pt works, drives, able to stay active     Hand Dominance        Extremity/Trunk Assessment   Upper Extremity Assessment Upper Extremity Assessment: Overall WFL for tasks assessed    Lower Extremity Assessment Lower Extremity Assessment: Overall WFL for tasks assessed       Communication   Communication: No difficulties  Cognition Arousal/Alertness: Awake/alert Behavior During Therapy: WFL for tasks assessed/performed Overall Cognitive Status: Within Functional Limits for tasks assessed                                        General Comments      Exercises Total Joint Exercises Ankle Circles/Pumps: AROM;10 reps Quad Sets: Strengthening;10 reps Short Arc Quad: Strengthening;10 reps Heel Slides: Strengthening;10 reps Hip ABduction/ADduction: Strengthening;10 reps Straight Leg Raises: AROM;10 reps Knee Flexion: PROM;5 reps Goniometric ROM: 2-88  Assessment/Plan    PT Assessment Patient needs continued PT services  PT Problem List Decreased strength;Decreased range of motion;Decreased activity tolerance;Decreased balance;Decreased mobility;Decreased knowledge of use of DME;Decreased safety awareness;Pain       PT Treatment Interventions DME instruction;Gait training;Functional mobility training;Therapeutic activities;Therapeutic exercise;Balance training;Patient/family education    PT Goals (Current goals can be found in the Care Plan section)       Frequency BID   Barriers to discharge        Co-evaluation               AM-PAC PT "6 Clicks"  Mobility  Outcome Measure Help needed turning from your back to your side while in a flat bed without using bedrails?: None Help needed moving from lying on your back to sitting on the side of a flat bed without using bedrails?: None Help needed moving to and from a bed to a chair (including a wheelchair)?: None Help needed standing up from a chair using your arms (e.g., wheelchair or bedside chair)?: None Help needed to walk in hospital room?: None Help needed climbing 3-5 steps with a railing? : A Little 6 Click Score: 23    End of Session Equipment Utilized During Treatment: Gait belt Activity Tolerance: Patient tolerated treatment well Patient left: in chair;with nursing/sitter in room Nurse Communication: Mobility status PT Visit Diagnosis: Muscle weakness (generalized) (M62.81);Difficulty in walking, not elsewhere classified (R26.2);Pain Pain - Right/Left: Left Pain - part of body: Knee    Time: 1340-1420 PT Time Calculation (min) (ACUTE ONLY): 40 min   Charges:   PT Evaluation $PT Eval Low Complexity: 1 Low PT Treatments $Gait Training: 8-22 mins $Therapeutic Exercise: 8-22 mins        Kreg Shropshire, DPT 04/29/2020, 4:16 PM

## 2020-04-29 NOTE — Addendum Note (Signed)
Addendum  created 04/29/20 1301 by Phill Mutter, MD   Clinical Note Signed, Intraprocedure Blocks edited

## 2020-04-30 ENCOUNTER — Encounter: Payer: Self-pay | Admitting: Surgery

## 2020-05-01 DIAGNOSIS — Z471 Aftercare following joint replacement surgery: Secondary | ICD-10-CM | POA: Diagnosis not present

## 2020-05-01 DIAGNOSIS — K5792 Diverticulitis of intestine, part unspecified, without perforation or abscess without bleeding: Secondary | ICD-10-CM | POA: Diagnosis not present

## 2020-05-01 DIAGNOSIS — Z7901 Long term (current) use of anticoagulants: Secondary | ICD-10-CM | POA: Diagnosis not present

## 2020-05-01 DIAGNOSIS — Z89012 Acquired absence of left thumb: Secondary | ICD-10-CM | POA: Diagnosis not present

## 2020-05-01 DIAGNOSIS — Z87891 Personal history of nicotine dependence: Secondary | ICD-10-CM | POA: Diagnosis not present

## 2020-05-01 DIAGNOSIS — Z8546 Personal history of malignant neoplasm of prostate: Secondary | ICD-10-CM | POA: Diagnosis not present

## 2020-05-01 DIAGNOSIS — K219 Gastro-esophageal reflux disease without esophagitis: Secondary | ICD-10-CM | POA: Diagnosis not present

## 2020-05-01 DIAGNOSIS — M48061 Spinal stenosis, lumbar region without neurogenic claudication: Secondary | ICD-10-CM | POA: Diagnosis not present

## 2020-05-01 DIAGNOSIS — F32A Depression, unspecified: Secondary | ICD-10-CM | POA: Diagnosis not present

## 2020-05-01 DIAGNOSIS — Z96652 Presence of left artificial knee joint: Secondary | ICD-10-CM | POA: Diagnosis not present

## 2020-05-01 DIAGNOSIS — E785 Hyperlipidemia, unspecified: Secondary | ICD-10-CM | POA: Diagnosis not present

## 2020-05-01 DIAGNOSIS — Z89022 Acquired absence of left finger(s): Secondary | ICD-10-CM | POA: Diagnosis not present

## 2020-05-02 DIAGNOSIS — Z471 Aftercare following joint replacement surgery: Secondary | ICD-10-CM | POA: Diagnosis not present

## 2020-05-02 DIAGNOSIS — Z96652 Presence of left artificial knee joint: Secondary | ICD-10-CM | POA: Diagnosis not present

## 2020-05-02 DIAGNOSIS — M1712 Unilateral primary osteoarthritis, left knee: Secondary | ICD-10-CM | POA: Diagnosis not present

## 2020-05-05 DIAGNOSIS — Z96652 Presence of left artificial knee joint: Secondary | ICD-10-CM

## 2020-05-05 HISTORY — DX: Presence of left artificial knee joint: Z96.652

## 2020-05-06 ENCOUNTER — Telehealth: Payer: Self-pay | Admitting: Internal Medicine

## 2020-05-06 NOTE — Telephone Encounter (Signed)
Left message for patient to call back and schedule Medicare Annual Wellness Visit (AWV)   This should be a telephone visit only=30 minutes.  Last AWV 04/08/16; please schedule at anytime with Denisa O'Brien-Blaney at [facility].   

## 2020-05-14 DIAGNOSIS — G8929 Other chronic pain: Secondary | ICD-10-CM | POA: Diagnosis not present

## 2020-05-14 DIAGNOSIS — M6281 Muscle weakness (generalized): Secondary | ICD-10-CM | POA: Diagnosis not present

## 2020-05-14 DIAGNOSIS — M25662 Stiffness of left knee, not elsewhere classified: Secondary | ICD-10-CM | POA: Diagnosis not present

## 2020-05-14 DIAGNOSIS — Z96652 Presence of left artificial knee joint: Secondary | ICD-10-CM | POA: Diagnosis not present

## 2020-05-14 DIAGNOSIS — M25562 Pain in left knee: Secondary | ICD-10-CM | POA: Diagnosis not present

## 2020-05-16 DIAGNOSIS — M6281 Muscle weakness (generalized): Secondary | ICD-10-CM | POA: Diagnosis not present

## 2020-05-16 DIAGNOSIS — M25562 Pain in left knee: Secondary | ICD-10-CM | POA: Diagnosis not present

## 2020-05-16 DIAGNOSIS — Z96652 Presence of left artificial knee joint: Secondary | ICD-10-CM | POA: Diagnosis not present

## 2020-05-16 DIAGNOSIS — M25662 Stiffness of left knee, not elsewhere classified: Secondary | ICD-10-CM | POA: Diagnosis not present

## 2020-05-16 DIAGNOSIS — G8929 Other chronic pain: Secondary | ICD-10-CM | POA: Diagnosis not present

## 2020-05-20 DIAGNOSIS — M6281 Muscle weakness (generalized): Secondary | ICD-10-CM | POA: Diagnosis not present

## 2020-05-20 DIAGNOSIS — M25662 Stiffness of left knee, not elsewhere classified: Secondary | ICD-10-CM | POA: Diagnosis not present

## 2020-05-20 DIAGNOSIS — M25562 Pain in left knee: Secondary | ICD-10-CM | POA: Diagnosis not present

## 2020-05-20 DIAGNOSIS — Z96652 Presence of left artificial knee joint: Secondary | ICD-10-CM | POA: Diagnosis not present

## 2020-05-20 DIAGNOSIS — G8929 Other chronic pain: Secondary | ICD-10-CM | POA: Diagnosis not present

## 2020-05-22 DIAGNOSIS — G8929 Other chronic pain: Secondary | ICD-10-CM | POA: Diagnosis not present

## 2020-05-22 DIAGNOSIS — M25562 Pain in left knee: Secondary | ICD-10-CM | POA: Diagnosis not present

## 2020-05-22 DIAGNOSIS — Z96652 Presence of left artificial knee joint: Secondary | ICD-10-CM | POA: Diagnosis not present

## 2020-05-22 DIAGNOSIS — M6281 Muscle weakness (generalized): Secondary | ICD-10-CM | POA: Diagnosis not present

## 2020-05-22 DIAGNOSIS — M25662 Stiffness of left knee, not elsewhere classified: Secondary | ICD-10-CM | POA: Diagnosis not present

## 2020-05-27 DIAGNOSIS — Z96652 Presence of left artificial knee joint: Secondary | ICD-10-CM | POA: Diagnosis not present

## 2020-05-27 DIAGNOSIS — M25562 Pain in left knee: Secondary | ICD-10-CM | POA: Diagnosis not present

## 2020-05-27 DIAGNOSIS — M25662 Stiffness of left knee, not elsewhere classified: Secondary | ICD-10-CM | POA: Diagnosis not present

## 2020-05-27 DIAGNOSIS — M6281 Muscle weakness (generalized): Secondary | ICD-10-CM | POA: Diagnosis not present

## 2020-05-27 DIAGNOSIS — G8929 Other chronic pain: Secondary | ICD-10-CM | POA: Diagnosis not present

## 2020-05-29 DIAGNOSIS — M25562 Pain in left knee: Secondary | ICD-10-CM | POA: Diagnosis not present

## 2020-05-29 DIAGNOSIS — M6281 Muscle weakness (generalized): Secondary | ICD-10-CM | POA: Diagnosis not present

## 2020-05-29 DIAGNOSIS — M25662 Stiffness of left knee, not elsewhere classified: Secondary | ICD-10-CM | POA: Diagnosis not present

## 2020-05-29 DIAGNOSIS — Z96652 Presence of left artificial knee joint: Secondary | ICD-10-CM | POA: Diagnosis not present

## 2020-05-29 DIAGNOSIS — G8929 Other chronic pain: Secondary | ICD-10-CM | POA: Diagnosis not present

## 2020-06-02 DIAGNOSIS — M25662 Stiffness of left knee, not elsewhere classified: Secondary | ICD-10-CM | POA: Diagnosis not present

## 2020-06-02 DIAGNOSIS — M25562 Pain in left knee: Secondary | ICD-10-CM | POA: Diagnosis not present

## 2020-06-02 DIAGNOSIS — M6281 Muscle weakness (generalized): Secondary | ICD-10-CM | POA: Diagnosis not present

## 2020-06-02 DIAGNOSIS — G8929 Other chronic pain: Secondary | ICD-10-CM | POA: Diagnosis not present

## 2020-06-02 DIAGNOSIS — Z96652 Presence of left artificial knee joint: Secondary | ICD-10-CM | POA: Diagnosis not present

## 2020-06-04 DIAGNOSIS — M6281 Muscle weakness (generalized): Secondary | ICD-10-CM | POA: Diagnosis not present

## 2020-06-04 DIAGNOSIS — M25562 Pain in left knee: Secondary | ICD-10-CM | POA: Diagnosis not present

## 2020-06-04 DIAGNOSIS — G8929 Other chronic pain: Secondary | ICD-10-CM | POA: Diagnosis not present

## 2020-06-04 DIAGNOSIS — Z96652 Presence of left artificial knee joint: Secondary | ICD-10-CM | POA: Diagnosis not present

## 2020-06-04 DIAGNOSIS — M25662 Stiffness of left knee, not elsewhere classified: Secondary | ICD-10-CM | POA: Diagnosis not present

## 2020-06-09 DIAGNOSIS — M25562 Pain in left knee: Secondary | ICD-10-CM | POA: Diagnosis not present

## 2020-06-23 DIAGNOSIS — H2181 Floppy iris syndrome: Secondary | ICD-10-CM | POA: Diagnosis not present

## 2020-06-23 DIAGNOSIS — H04123 Dry eye syndrome of bilateral lacrimal glands: Secondary | ICD-10-CM | POA: Diagnosis not present

## 2020-06-23 DIAGNOSIS — H43813 Vitreous degeneration, bilateral: Secondary | ICD-10-CM | POA: Diagnosis not present

## 2020-06-23 DIAGNOSIS — Z961 Presence of intraocular lens: Secondary | ICD-10-CM | POA: Diagnosis not present

## 2020-06-23 DIAGNOSIS — H524 Presbyopia: Secondary | ICD-10-CM | POA: Diagnosis not present

## 2020-07-07 DIAGNOSIS — I872 Venous insufficiency (chronic) (peripheral): Secondary | ICD-10-CM | POA: Diagnosis not present

## 2020-07-07 DIAGNOSIS — D225 Melanocytic nevi of trunk: Secondary | ICD-10-CM | POA: Diagnosis not present

## 2020-07-07 DIAGNOSIS — D2271 Melanocytic nevi of right lower limb, including hip: Secondary | ICD-10-CM | POA: Diagnosis not present

## 2020-07-07 DIAGNOSIS — D2272 Melanocytic nevi of left lower limb, including hip: Secondary | ICD-10-CM | POA: Diagnosis not present

## 2020-07-07 DIAGNOSIS — D2262 Melanocytic nevi of left upper limb, including shoulder: Secondary | ICD-10-CM | POA: Diagnosis not present

## 2020-07-07 DIAGNOSIS — L821 Other seborrheic keratosis: Secondary | ICD-10-CM | POA: Diagnosis not present

## 2020-07-07 DIAGNOSIS — D485 Neoplasm of uncertain behavior of skin: Secondary | ICD-10-CM | POA: Diagnosis not present

## 2020-07-07 DIAGNOSIS — D2261 Melanocytic nevi of right upper limb, including shoulder: Secondary | ICD-10-CM | POA: Diagnosis not present

## 2020-07-11 DIAGNOSIS — M1812 Unilateral primary osteoarthritis of first carpometacarpal joint, left hand: Secondary | ICD-10-CM | POA: Diagnosis not present

## 2020-07-11 DIAGNOSIS — M1811 Unilateral primary osteoarthritis of first carpometacarpal joint, right hand: Secondary | ICD-10-CM | POA: Diagnosis not present

## 2020-08-01 ENCOUNTER — Ambulatory Visit: Payer: PPO | Admitting: Internal Medicine

## 2020-09-17 ENCOUNTER — Ambulatory Visit: Payer: PPO

## 2020-09-22 IMAGING — DX LEFT CLAVICLE - 2+ VIEWS
2 series · 2 of 2 positions shown · non-contrast
Comparison: Chest x-ray May 05, 2016

CLINICAL DATA: Left collar bone pain.

EXAM:
LEFT CLAVICLE - 2+ VIEWS

[clavicle ap]
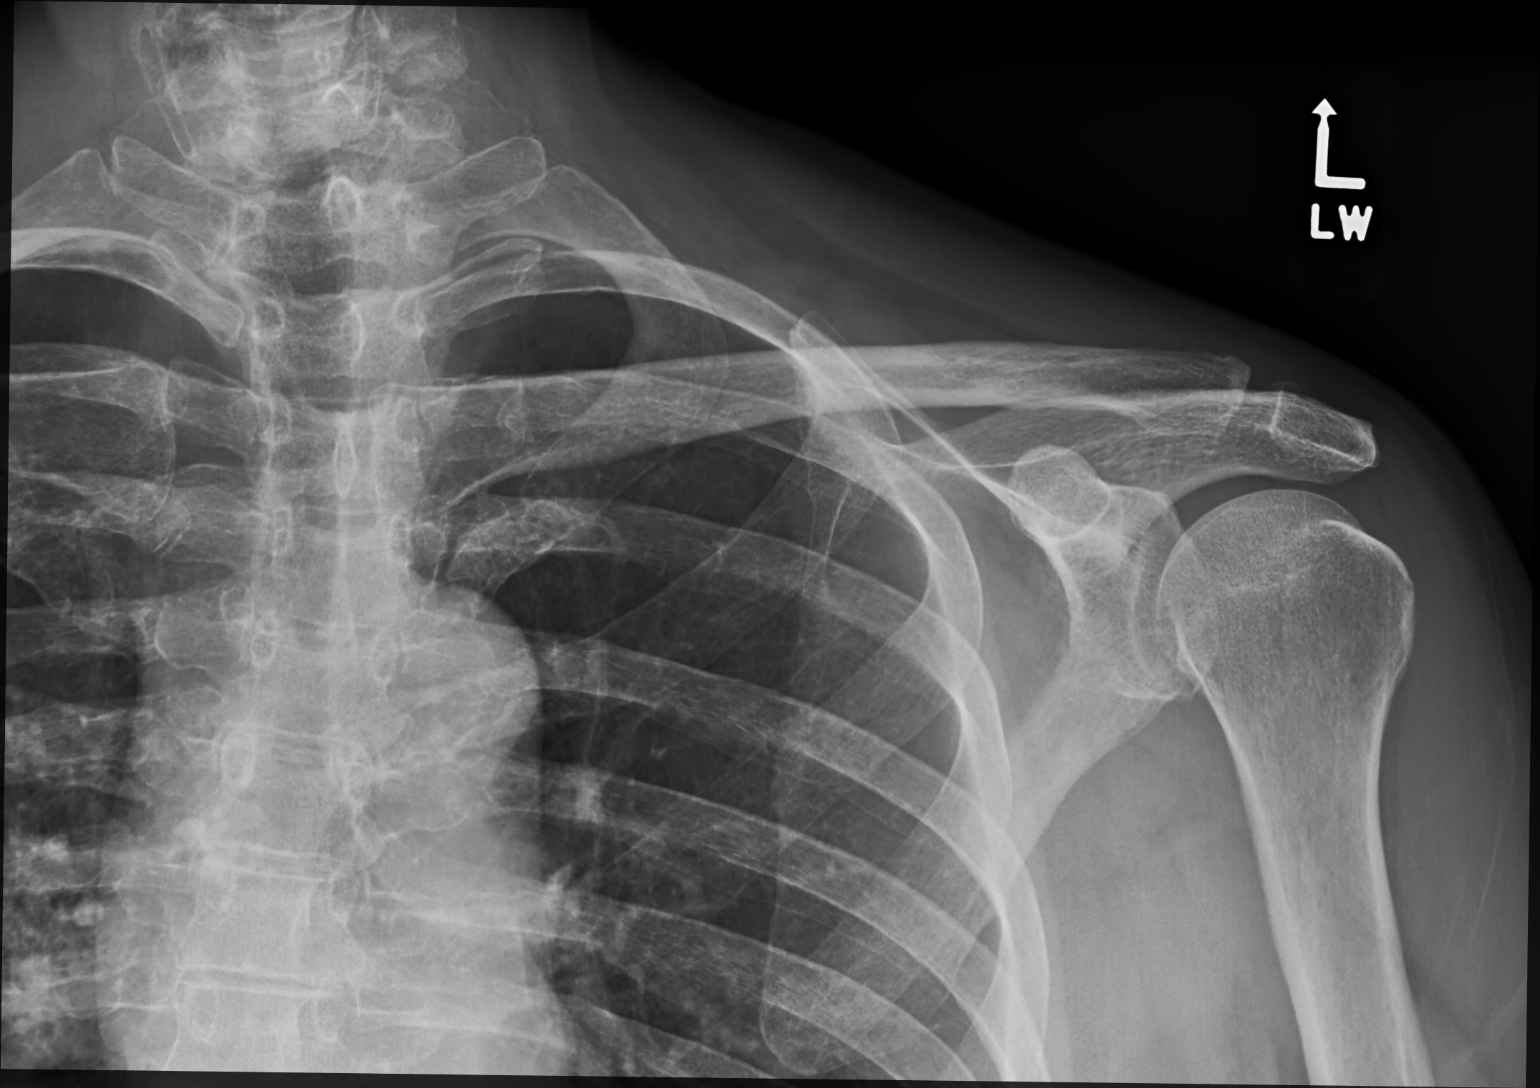

[clavicle tangential]
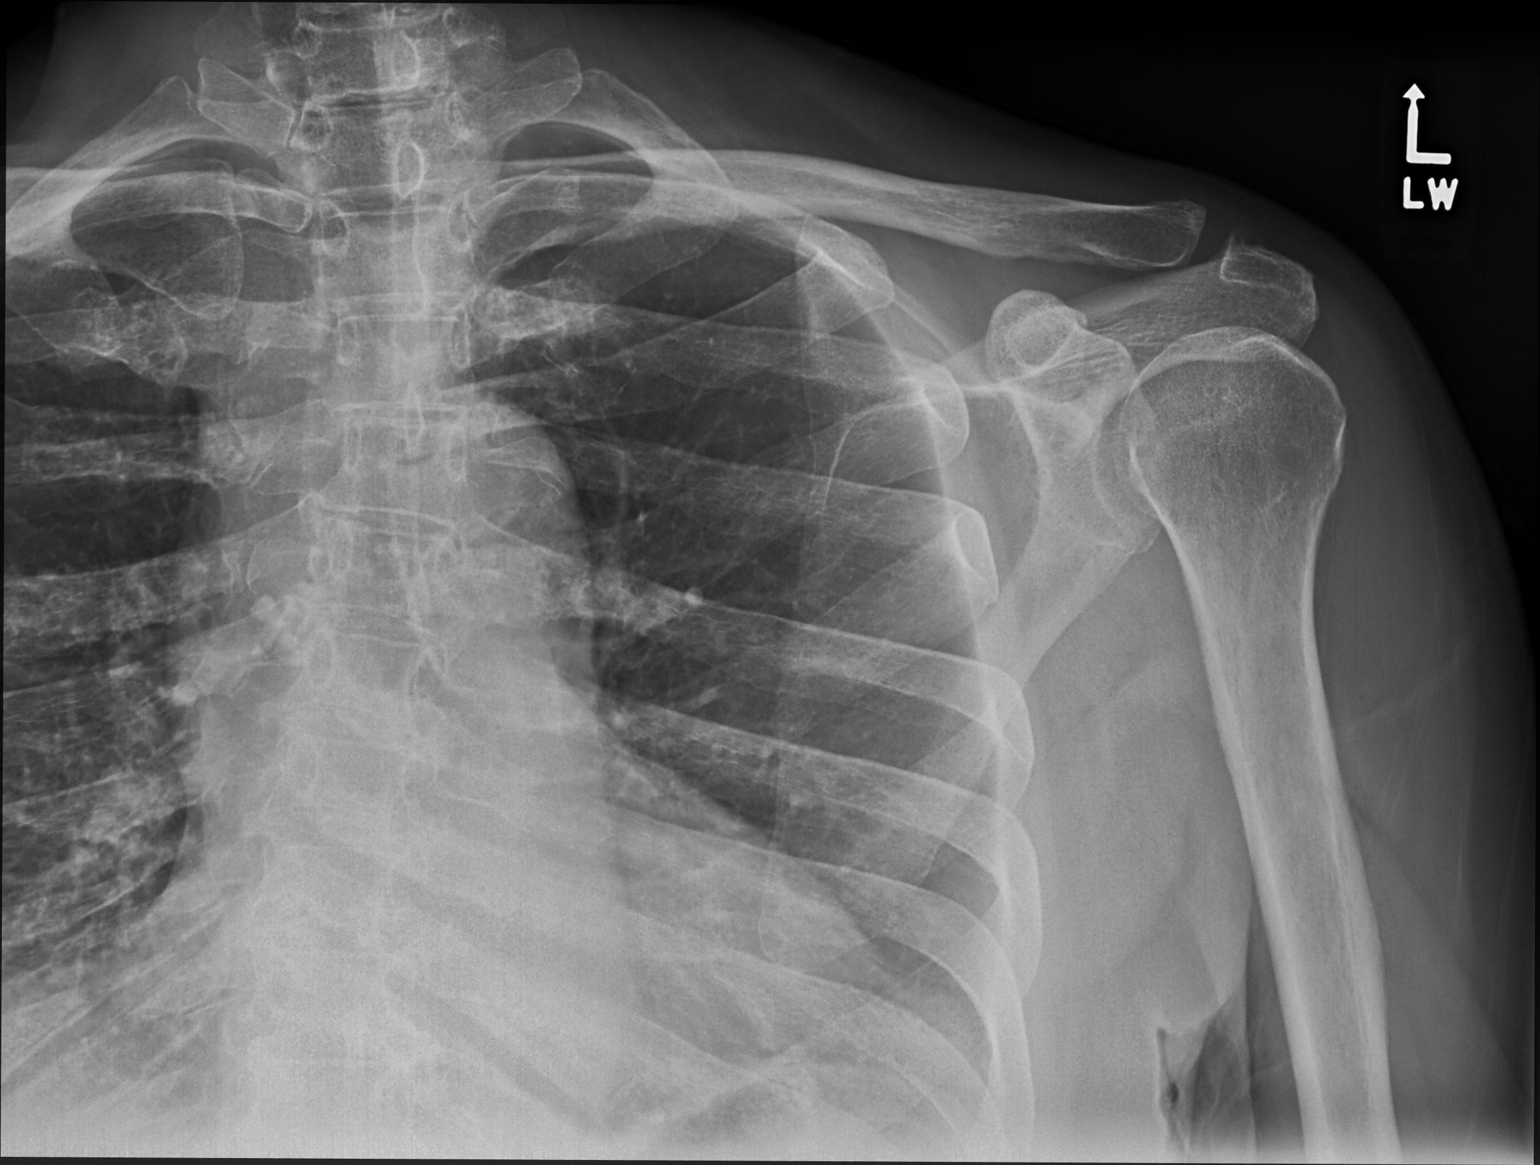

[2 of 2 positions shown; findings below may reference images not displayed]

FINDINGS: There is no evidence of fracture or dislocation. Soft tissues are
unremarkable.
IMPRESSION: Negative.

## 2020-10-17 ENCOUNTER — Ambulatory Visit (INDEPENDENT_AMBULATORY_CARE_PROVIDER_SITE_OTHER): Payer: PPO

## 2020-10-17 ENCOUNTER — Encounter: Payer: Self-pay | Admitting: Internal Medicine

## 2020-10-17 ENCOUNTER — Ambulatory Visit (INDEPENDENT_AMBULATORY_CARE_PROVIDER_SITE_OTHER): Payer: PPO | Admitting: Internal Medicine

## 2020-10-17 ENCOUNTER — Other Ambulatory Visit: Payer: Self-pay

## 2020-10-17 VITALS — BP 140/80 | HR 77 | Temp 98.0°F | Ht 67.99 in | Wt 188.1 lb

## 2020-10-17 DIAGNOSIS — M79672 Pain in left foot: Secondary | ICD-10-CM

## 2020-10-17 DIAGNOSIS — S46212A Strain of muscle, fascia and tendon of other parts of biceps, left arm, initial encounter: Secondary | ICD-10-CM

## 2020-10-17 DIAGNOSIS — M21619 Bunion of unspecified foot: Secondary | ICD-10-CM | POA: Diagnosis not present

## 2020-10-17 HISTORY — DX: Pain in left foot: M79.672

## 2020-10-17 HISTORY — DX: Strain of muscle, fascia and tendon of other parts of biceps, left arm, initial encounter: S46.212A

## 2020-10-17 MED ORDER — TRAMADOL HCL 50 MG PO TABS: 50.0000 mg | ORAL_TABLET | Freq: Two times a day (BID) | ORAL | 0 refills | Status: AC | PRN

## 2020-10-17 NOTE — Progress Notes (Signed)
Chief Complaint  Patient presents with   Acute Visit    Foot pain X 1 mo   F/u  1. BP borderline today  2. Left foot pain x months 8/10 x 1 month tried spacers gel cushions on his foot 2nd and 3rd toe and has bunion on left foot. Nothing is helping 3. Left biceps tendon tear due to work related injury doing workers comp at work [facility] had consult and does not want to do surgery he tried to stop 800 lb machine from spinning. He has 2nd consult [facility] Dr. Melrose Nakayama great toe pain as well  He has tried leftover oxycodone 5 mg qd prn  Review of Systems  Constitutional:  Negative for weight loss.  Respiratory:  Negative for shortness of breath.   Cardiovascular:  Negative for chest pain.  Musculoskeletal:  Positive for joint pain.  Past Medical History:  Diagnosis Date   Amputation of finger, left    5th finger    Arthritis    hands, back lumbar sacral    Cancer ([place]) 2006   Prostate cancer with resection radical prostate removal. Urologist Dr. Hollice Espy locally; prostate ca dx'ed in 2007 and 2015    Carotid artery stenosis    07/26/16 US carotid b/l 1-39%    Cataract    s/p surgery b/l Dr. [facility] 2018   Depression    Diverticulosis    Erectile dysfunction    Gastric ulcer    GERD (gastroesophageal reflux disease)    occ no meds   Hepatic steatosis    History of kidney stones    h/o   History of splenectomy    Hyperlipidemia    Spinal stenosis    Spinal stenosis at L4-L5 level    Thoracic aortic atherosclerosis ([facility])    Urinary stress incontinence, male    Past Surgical History:  Procedure Laterality Date   COLONOSCOPY     INCONTINENCE SURGERY     KNEE ARTHROSCOPY N/A 1969   meniscal tear. left    PROSTATECTOMY  2007   removal of prostate   REPAIR OF PERFORATED ULCER     2009   SPLENECTOMY  1953   after trauma, fell out tree as kid   TOTAL KNEE ARTHROPLASTY Left 04/29/2020   Procedure: TOTAL KNEE ARTHROPLASTY;  Surgeon: Corky Mull, MD;  Location: [facility] ORS;  Service: Orthopedics;  Laterality: Left;   Family History  Problem Relation Age of Onset   Dementia Mother    Glaucoma Mother    Alcohol abuse Father    Glaucoma Sister    Heart disease Brother        heart valve issue   Multiple sclerosis Brother    Kidney disease Neg Hx    Prostate cancer Neg Hx    Social History   Socioeconomic History   Marital status: Married    Spouse name: Not on file   Number of children: Not on file   Years of education: Not on file   Highest education level: Not on file  Occupational History   Not on file  Tobacco Use   Smoking status: Former    Packs/day: 1.00    Years: 7.00    Pack years: 7.00    Types: Cigarettes    Quit date: 06/05/1978    Years since quitting: 42.3   Smokeless tobacco: Never   Tobacco comments:    quit in 1980s duration 6-7 YRS 1 ppd no FH lung cancer  Vaping Use   Vaping Use: Never used  Substance and Sexual Activity   Alcohol use: Yes    Alcohol/week: 0.0 standard drinks    Comment: [place] [employer]   Drug use: No   Sexual activity: Not Currently  Other Topics Concern   Not on file  Social History Narrative   Born in [place].   Lived in [place].    Moved to [facility], brother lives in [place] another [place]       Has 2 brothers and 1 sister.      Lives with wife in [place]. Has 3 daughters.      Work - retired, Surveyor, quantity      Diet - regular      Exercise - walks occasionally   Social Determinants of Radio broadcast assistant Strain: Not on file  Food Insecurity: Not on file  Transportation Needs: Not on file  Physical Activity: Not on file  Stress: Not on file  Social Connections: Not on file  Intimate Partner Violence: Not on file   Current Meds  Medication Sig   DULoxetine (CYMBALTA) 60 MG capsule Take 1 capsule (60 mg total) by mouth daily. (Patient taking differently: Take 60 mg by mouth at bedtime.)   Multiple Vitamin (MULTIVITAMIN) capsule Take 1 capsule by  mouth daily.   traMADol (ULTRAM) 50 MG tablet Take 1 tablet (50 mg total) by mouth 2 (two) times daily as needed for up to 5 days.   triamcinolone ointment (KENALOG) 0.1 % Apply 1 application topically 2 (two) times daily. Rash to leg (Patient taking differently: Apply 1 application topically 2 (two) times daily as needed. Rash to leg)   valACYclovir (VALTREX) 1000 MG tablet Take 1 tablet (1,000 mg total) by mouth 3 (three) times daily. (Patient taking differently: Take 1,000 mg by mouth 3 (three) times daily as needed (cold sores).)   No Known Allergies No results found for this or any previous visit (from the past 2160 hour(s)). Objective  Body mass index is 28.61 kg/m. Wt Readings from Last 3 Encounters:  10/17/20 188 lb 1.9 oz (85.3 kg)  04/29/20 196 lb (88.9 kg)  04/21/20 191 lb 2.2 oz (86.7 kg)   Temp Readings from Last 3 Encounters:  10/17/20 98 F (36.7 C) (Oral)  04/29/20 98 F (36.7 C) (Temporal)  04/08/20 97.7 F (36.5 C)   BP Readings from Last 3 Encounters:  10/17/20 140/80  04/29/20 (!) 155/78  04/21/20 (!) 160/83   Pulse Readings from Last 3 Encounters:  10/17/20 77  04/29/20 (!) 58  04/21/20 63    Physical Exam Vitals and nursing note reviewed.  Constitutional:      Appearance: Normal appearance. He is well-developed and well-groomed.  HENT:     Head: Normocephalic and atraumatic.  Eyes:     Conjunctiva/sclera: Conjunctivae normal.     Pupils: Pupils are equal, round, and reactive to light.  Cardiovascular:     Rate and Rhythm: Normal rate and regular rhythm.     Heart sounds: Normal heart sounds. No murmur heard. Pulmonary:     Effort: Pulmonary effort is normal.     Breath sounds: Normal breath sounds.  Musculoskeletal:     Left foot: Bunion and bony tenderness present.     Comments: Ttp 2nd and 3rd toes on the top  Skin:    General: Skin is warm and dry.  Neurological:     General: No focal deficit present.     Mental Status: He is alert and  oriented to person, place, and time. Mental status is at baseline.     Gait: Gait normal.  Psychiatric:        Attention and Perception: Attention and perception normal.        Mood and Affect: Mood and affect normal.        Speech: Speech normal.        Behavior: Behavior normal. Behavior is cooperative.        Thought Content: Thought content normal.        Cognition and Memory: Cognition and memory normal.        Judgment: Judgment normal.   Assessment  Plan  Left foot pain - Plan: DG Foot Complete Left, traMADol (ULTRAM) 50 MG tablet bid prn Ambulatory referral to Podiatry  Rupture of left biceps tendon, initial encounter - Plan: traMADol (ULTRAM) 50 MG tablet Has f/u Dr. Melrose Nakayama [facility] in [place] upcoming  Bunion of great toe - Plan: Ambulatory referral to Podiatry   HM Had flu shot utd  Tdap 2015 Had prevnar and pna 23  shingrix Rx has not had yet given tx today   covid 2/2 pfizer sch 04/10/20 3rd dose call back with date Fasting labs 11/2020    Need to get colonoscopy record just have nl per report in chart given release of records pt signed again today to get from [place] need record    Fu urology. Rad onc appts h/o prostate cancer PSA neg 12/2017 due to f/u 03/2019 <0.01 negative      Ref. Range 11/14/2019 10:35  Prostate Specific Ag, Serum Latest Ref Range: 0.0 - 4.0 ng/mL <0.1   f/u urology 11/2020    dermatology established with Roe Coombs seen 2020  seen end 06/2019 or 07/2019 doing well no tx or bx f/u 1 year   Hep C neg 04/08/17   Former smoker quit in 1980s smoked x 6-7 years no FH lung cancer smoked 1 ppd        Provider: Dr. Olivia Mackie McLean-Scocuzza-Internal Medicine

## 2020-10-17 NOTE — Patient Instructions (Addendum)
Aspercream with lidocaine  Voltaren gel 4x per   Consider 4th pfizer vaccine  Distal Biceps Tendon Tear The distal biceps tendon is a strong cord of tissue that connects the biceps muscle--which is the muscle on the front of the upper arm--to a bone (radius) in the elbow. Distal biceps tendon tear is an injury that may include either of the following: A complete tear (rupture) in the tendon, causing the tendon to completely separate from the radius. When this happens, the biceps muscle pulls up (retracts) toward the shoulder. A partial tear in the tendon that causes the tendon to partially separate from the radius. This condition can interfere with your ability to turn your hand palm-up (supination) and bend (flex) your elbow. It is often treated with surgery, and recovery may take 4-68months. What are the causes? This condition is usually caused by suddenly straightening the elbow while the biceps muscle is tightened (contracted). This could happen, for example, while lifting or carrying a heavy objectthat suddenly falls or shifts position. What increases the risk? The following factors may make you more likely to develop this condition: Being a man who is 67-60 years old. Smoking. Using steroids. What are the signs or symptoms? Symptoms of this condition may include: A feeling like a snap or a pop in the elbow at the time of injury. Pain, swelling, and bruising in the front of the elbow. Weakness in the arm when doing certain movements, such as: Lifting or carrying objects. Rotating the forearm, such as when you turn a screwdriver. Bending the elbow. A bulge in the upper arm. You may feel this in the front of the arm, the inside of the arm, or both. Limited range of motion of the elbow and forearm. How is this diagnosed? This condition may be diagnosed based on: Your symptoms and medical history. A physical exam. Your health care provider may test your range of motion by having you do  arm movements. Tests, such as: X-rays. MRI. Ultrasound. How is this treated? Treatment for this condition may include: Medicines to help relieve pain and inflammation. A splint or brace to immobilize your elbow. Wearing a sling to help rest your arm. Resting from sports and intense physical activity. Surgery to reattach your tendon to your radius. This is the most common treatment. Physical therapy. Follow these instructions at home: If you have a splint, brace, or sling: Wear the splint, brace, or sling as told by your health care provider. Remove it only as told by your health care provider. Loosen the splint, brace, or sling if your fingers tingle, become numb, or turn cold and blue. Keep the splint, brace, or sling clean and dry. Bathing Do not take baths, swim, or use a hot tub until your health care provider approves. Ask your health care provider if you may take showers. You may only be allowed to take sponge baths. If you have a splint, brace, or sling that is not waterproof: Do not let it get wet. Cover it with a watertight covering when you take a bath or shower. Managing pain, stiffness, and swelling     If directed, put ice on the injured area: Put ice in a plastic bag. Place a towel between your skin and the bag. Leave the ice on for 20 minutes, 2-3 times a day. If directed, apply heat to the affected area before you exercise or as often as told by your health care provider. Use the heat source that your health care provider recommends,  such as a moist heat pack or a heating pad. Place a towel between your skin and the heat source. Leave the heat on for 20-30 minutes. Remove the heat if your skin turns bright red. This is especially important if you are unable to feel pain, heat, or cold. You may have a greater risk of getting burned. Move your fingers often to avoid stiffness and to lessen swelling. Raise (elevate) the injured area above the level of your heart while  you are sitting or lying down. Activity Return to your normal activities as told by your health care provider. Ask your health care provider what activities are safe for you. Until your health care provider approves: Do not lift or carry anything with your injured arm. Do not use the affected limb to support your body weight. Do not participate in contact sports. Avoid activities that cause pain or make your condition worse. Do exercises as told by your physical therapist or health care provider. General instructions Take over-the-counter and prescription medicines only as told by your health care provider. If you have a splint, do not put pressure on any part of the splint until it is fully hardened. This may take several hours. Ask your health care provider when it is safe to drive if you have a splint, brace, or sling on your arm. Do not use any products that contain nicotine or tobacco, such as cigarettes, e-cigarettes, and chewing tobacco. If you need help quitting, ask your health care provider. Keep all follow-up visits as told by your health care provider. This is important. Contact a health care provider if: Your splint or brace causes pain or numbness. Get help right away if: You develop severe pain. You develop numbness or tingling in your hand. Your hand feels unusually cold. Your fingernails turn a dark color, such as blue or gray. Summary Distal biceps tendon tear is an injury that may involve a complete or partial tear of the tendon. This condition is usually caused by suddenly straightening the elbow while the biceps muscle is tightened. Treatment may include medicines, rest, physical therapy, immobilization, or surgery. This information is not intended to replace advice given to you by your health care provider. Make sure you discuss any questions you have with your healthcare provider. Document Revised: 03/01/2018 Document Reviewed: 08/22/2017 Elsevier Patient Education   2022 [employer].  Distal Biceps Tendon Tear Rehab Ask your health care provider which exercises are safe for you. Do exercises exactly as told by your health care provider and adjust them as directed. It is normal to feel mild stretching, pulling, tightness, or discomfort as you do these exercises. Stop right away if you feel sudden pain or your pain gets worse. Do not begin these exercises until told by your health care provider. Stretching and range-of-motion exercises These exercises warm up your muscles and joints and improve the movement and flexibility of your arm. The exercises can also help to relieve pain, numbness,and tingling. Passive elbow flexion  Stand or sit with your left / right arm at your side. Use your other hand to gently push your left / right hand toward your shoulder (flexion). Bend your elbow as far as your health care provider tells you to. You may be instructed to try to bend your elbow more and more over time. Hold this position for __________ seconds. Slowly return to the starting position. Repeat __________ times. Complete this exercise __________ times a day. Passive supination This exercise is sometimes called elbow extension  stretch. Lie on your back in a comfortable position that allows you to relax your arm muscles. Place a folded towel under your left / right upper arm so your elbow and shoulder are at the same height. Use your other arm to raise your left / right arm until your elbow does not rest on the bed or towel. Hold your left / right arm out straight with your other hand supporting it. Let the weight of your hand stretch the inside of your elbow. Keep your arm and chest muscles relaxed. If directed, you may hold a __________ weight in your hand to increase the intensity of the stretch. Hold this position for __________ seconds. Slowly release the stretch. Repeat __________ times. Complete this exercise __________ times a day. Passive  supination This exercise is sometimes called palms-up rotation stretch. Sit with your left / right elbow bent to a 90-degree angle (right angle) and your forearm resting on a table with your palm down. Keeping your upper body and shoulder still, use your other hand to rotate your left / right palm up (supination). Stop when you feel a gentle to moderate stretch. Hold this position for __________ seconds. Slowly return to the starting position. Repeat __________ times. Complete this exercise __________ times a day. Passive pronation This exercise is sometimes called palm-down rotation stretch. Sit with your left / right elbow bent to a 90-degree angle (right angle) and your forearm resting on a table with your palm up. Keeping your upper body and shoulder still, use your other hand to rotate your left / right palm down (pronation). Stop when you feel a gentle to moderate stretch. Hold this position for __________ seconds. Slowly return to the starting position. Repeat __________ times. Complete this exercise __________ times a day. Active elbow flexion Stand or sit with your left / right elbow bent and your palm facing in, toward your body. Bend your elbow as far as you can using only your arm muscles (flexion). Hold this position for __________ seconds. Slowly return to the starting position. Repeat __________ times. Complete this exercise __________ times a day. Active elbow extension Stand or sit with your left / right elbow bent and your palm facing in, toward your body. Using only your arm muscles, slowly straighten your elbow (extension). Stop when you feel a gentle stretch at the front of your arm, or when you reach the position where your health care provider tells you to stop. You may be instructed to try to straighten your arm more and more each week. Hold this position for __________ seconds. Slowly return to the starting position. Repeat __________ times. Complete this exercise  __________ times a day. Active supination This exercise is sometimes called palm-up rotation of the forearm. Stand or sit with your left / right elbow bent to a 90-degree angle (right angle). Rotate your palm up (supination) until you feel a gentle stretch on the inside of your forearm. Hold this position for __________ seconds. Slowly return to the starting position. Repeat __________ times. Complete this exercise __________ times a day. Active pronation This exercise is sometimes called palm-down rotation of the forearm. Stand or sit with your left / right elbow bent to a 90-degree angle (right angle). Rotate your palm down (pronation) until you feel a gentle stretch on the top of your forearm. Hold this position for __________ seconds. Slowly return to the starting position. Repeat __________ times. Complete this exercise __________ times a day. Strengthening exercises These exercises build strength and endurance  in your arm and shoulder. Endurance is the ability to use your muscles for a long time, even after they get tired. Do not start any strengthening exercises until told by your health care provider. Isometric elbow flexion Stand or sit with your left / right arm at waist height. Your palm should face in, toward your body. Place your other hand on top of your left / right forearm. Gently push down while you resist with your left / right arm (isometric). Use about 50% effort with both arms. You may be instructed to use more and more effort with your arms each week. Try not to let your left / right elbow move. Hold this position for __________ seconds. Let your muscles relax completely before you repeat the exercise. Repeat __________ times. Complete this exercise __________ times a day. Isometric elbow extension  Stand or sit with your left / right arm at waist height. Your palm should face in, toward your body. Place your other hand on the bottom of your left / right forearm.  Gently push up while you resist with your left / right arm (isometric). Use about 50% effort with both arms. You may be instructed to use more and more effort with your arms each week. Try not to let your left / right elbow move. Hold this position for __________ seconds. Let your muscles relax completely before you repeat this exercise. Repeat __________ times. Complete this exercise __________ times a day. Elbow flexion, supination This exercise is also called biceps curls. Sit on a stable chair without armrests, or stand up. Hold a __________ weight in your left / right hand. Your palm should face out, away from your body, at the starting position (supination). Bend your left / right elbow and move your hand up toward your shoulder (flexion). Keep your elbow at your side while you bend it. Slowly return to the starting position. Repeat __________ times. Complete this exercise __________ times a day. Elbow flexion, neutral This exercise is also called hammer curls. Sit on a stable chair without armrests, or stand up. Hold a __________ weight in your left / right hand, or hold an exercise band with both hands. Your palms should face each other at the starting position (neutral position). Keeping your other arm straight, bend your left / right elbow and move your hand up toward your shoulder (flexion). Keep your elbow at your side while you bend it. Slowly return to the starting position. Repeat __________ times. Complete this exercise __________ times a day. Elbow extension with weight This exercise is also called triceps curls with weight. Lie on your back. Hold a __________ weight in your left / right hand. Bend your left / right elbow to a 90-degree angle (right angle) so that the weight is in front of your face, over your chest, and your elbow is pointed up to the ceiling. Straighten your elbow, raising your hand toward the ceiling (extension). Use your other hand to support your left /  right upper arm and to keep it still. Slowly return to the starting position. Repeat __________ times. Complete this exercise __________ times a day. Active elbow extension with exercise band This exercise is sometimes called triceps curls with exercise band. Sit on a stable chair without armrests, or stand up. Hold an exercise band in both hands. Keeping your upper arms at your sides, bring both hands up to your shoulder. Keep your left / right hand just below your other hand. Straighten your left / right elbow (  extension) while keeping your other arm still. Hold this position for __________ seconds. Slowly bend your elbow to return to the starting position. Repeat __________ times. Complete this exercise __________ times a day. Forearm rotation, supination This exercise is sometimes called palm-up stretch. Sit with your left / right forearm supported on a table. Your elbow should be at waist height. Gently grasp a lightweight hammer. Rest your hand over the edge of the table, palm down. Without moving your left / right elbow, slowly rotate your palm up (supination), stopping when your thumb is pointed toward the ceiling. Hold this position for__________ seconds. Slowly return to the starting position. Repeat __________ times. Complete this exercise __________ times a day. Forearm rotation, pronation This exercise is sometimes called palm-down stretch. Sit with your left / right forearm supported on a table. Your elbow should be at waist height. Gently grasp a lightweight hammer. Rest your hand over the edge of the table, palm up. Without moving your left / right elbow, slowly rotate your palm down (pronation), stopping when your thumb is pointed toward the floor. Hold this position for __________ seconds. Slowly return to the starting position. Repeat __________ times. Complete this exercise __________ times a day. This information is not intended to replace advice given to you by  your health care provider. Make sure you discuss any questions you have with your healthcare provider. Document Revised: 07/19/2018 Document Reviewed: 03/30/2018 Elsevier Patient Education  2022 [employer].

## 2020-10-20 ENCOUNTER — Encounter: Payer: Self-pay | Admitting: Podiatry

## 2020-10-20 ENCOUNTER — Ambulatory Visit (INDEPENDENT_AMBULATORY_CARE_PROVIDER_SITE_OTHER): Payer: PPO

## 2020-10-20 ENCOUNTER — Other Ambulatory Visit: Payer: Self-pay

## 2020-10-20 ENCOUNTER — Ambulatory Visit: Payer: PPO | Admitting: Podiatry

## 2020-10-20 ENCOUNTER — Ambulatory Visit: Payer: PPO

## 2020-10-20 DIAGNOSIS — M2042 Other hammer toe(s) (acquired), left foot: Secondary | ICD-10-CM

## 2020-10-20 DIAGNOSIS — M7752 Other enthesopathy of left foot: Secondary | ICD-10-CM

## 2020-10-20 DIAGNOSIS — M2012 Hallux valgus (acquired), left foot: Secondary | ICD-10-CM

## 2020-10-20 DIAGNOSIS — Q66222 Congenital metatarsus adductus, left foot: Secondary | ICD-10-CM

## 2020-10-20 DIAGNOSIS — M21619 Bunion of unspecified foot: Secondary | ICD-10-CM

## 2020-10-20 NOTE — Progress Notes (Signed)
  Subjective:  Patient ID: Benjamin Mills, male    DOB: Aug 17, 1945,  MRN: [id number]  Chief Complaint  Patient presents with   Bunions    NP - Left foot pain, Bunion of great toe    75 y.o. male presents with the above complaint. History confirmed with patient.  This been present for many years and has slowly been worsening.  Most the pain is over the bump in shoes he has to wear steel toed shoes for his work.  He is a Furniture conservator/restorer.  Objective:  Physical Exam: warm, good capillary refill, no trophic changes or ulcerative lesions, normal DP and PT pulses, and normal sensory exam. Left Foot: Significant hallux valgus deformity with large medial eminence and abduction of the hallux.  Range of motion intact to the MTPJ.  Negative grind test.  Radiographs: Multiple views x-ray of the left foot: Moderate to severe hallux valgus with metatarsus adductus deformity 1 2 and 3 Assessment:   1. Hallux valgus, left   2. Hammertoe of left foot   3. Bursitis of intermetatarsal bursa of left foot   4. Metatarsus adductus of left foot      Plan:  Patient was evaluated and treated and all questions answered.  Discussed treatment options both surgical and nonsurgical for hallux valgus deformity.  Discussed with him he likely needs metatarsal adductus correction with Lapidus fusion with correction of 1 2 and 3 TMT arthrodesis.  He like to wait until the winter in January February for this.  He will return to see me in November for presurgical planning visit.  We discussed the risk, benefits and potential complication as well as the postoperative course for this.  We also discussed the option of first metatarsophalangeal joint arthrodesis.  I think he would prefer a Lapidus correction and that he would do well with this.  Return in about 20 weeks (around 03/09/2021) for bunion surgery planning visit .

## 2020-10-20 NOTE — Addendum Note (Signed)
Addended by: Orland Mustard on: 10/20/2020 08:16 AM   Modules accepted: Orders

## 2020-10-23 ENCOUNTER — Telehealth: Payer: Self-pay | Admitting: Internal Medicine

## 2020-10-23 NOTE — Addendum Note (Signed)
Addended by: Orland Mustard on: 10/23/2020 06:28 PM   Modules accepted: Orders

## 2020-10-23 NOTE — Telephone Encounter (Signed)
Note need labs 04/08/20 and 10/23/20 with lab visit 11/13/20 please note this in lab note thank you

## 2020-10-24 NOTE — Telephone Encounter (Signed)
Added to appointment note

## 2020-10-27 DIAGNOSIS — M1811 Unilateral primary osteoarthritis of first carpometacarpal joint, right hand: Secondary | ICD-10-CM | POA: Diagnosis not present

## 2020-10-27 DIAGNOSIS — M1712 Unilateral primary osteoarthritis, left knee: Secondary | ICD-10-CM | POA: Diagnosis not present

## 2020-10-27 DIAGNOSIS — Z96652 Presence of left artificial knee joint: Secondary | ICD-10-CM | POA: Diagnosis not present

## 2020-10-27 DIAGNOSIS — M1812 Unilateral primary osteoarthritis of first carpometacarpal joint, left hand: Secondary | ICD-10-CM | POA: Diagnosis not present

## 2020-11-13 ENCOUNTER — Other Ambulatory Visit: Payer: Self-pay

## 2020-11-13 ENCOUNTER — Other Ambulatory Visit (INDEPENDENT_AMBULATORY_CARE_PROVIDER_SITE_OTHER): Payer: PPO

## 2020-11-13 DIAGNOSIS — R739 Hyperglycemia, unspecified: Secondary | ICD-10-CM

## 2020-11-13 DIAGNOSIS — Z0000 Encounter for general adult medical examination without abnormal findings: Secondary | ICD-10-CM | POA: Diagnosis not present

## 2020-11-13 DIAGNOSIS — E785 Hyperlipidemia, unspecified: Secondary | ICD-10-CM

## 2020-11-13 DIAGNOSIS — M109 Gout, unspecified: Secondary | ICD-10-CM

## 2020-11-13 DIAGNOSIS — C61 Malignant neoplasm of prostate: Secondary | ICD-10-CM

## 2020-11-13 LAB — COMPREHENSIVE METABOLIC PANEL
ALT: 15 U/L (ref 0–53)
AST: 18 U/L (ref 0–37)
Albumin: 3.9 g/dL (ref 3.5–5.2)
Alkaline Phosphatase: 60 U/L (ref 39–117)
BUN: 19 mg/dL (ref 6–23)
CO2: 29 mEq/L (ref 19–32)
Calcium: 9.4 mg/dL (ref 8.4–10.5)
Chloride: 104 mEq/L (ref 96–112)
Creatinine, Ser: 0.83 mg/dL (ref 0.40–1.50)
GFR: 85.59 mL/min (ref >60.00)
Glucose, Bld: 82 mg/dL (ref 70–99)
Potassium: 4.5 mEq/L (ref 3.5–5.1)
Sodium: 139 mEq/L (ref 135–145)
Total Bilirubin: 0.7 mg/dL (ref 0.2–1.2)
Total Protein: 6.3 g/dL (ref 6.0–8.3)

## 2020-11-13 LAB — URIC ACID: Uric Acid, Serum: 5.4 mg/dL (ref 4.0–7.8)

## 2020-11-13 LAB — CBC WITH DIFFERENTIAL/PLATELET
Basophils Absolute: 0 10*3/uL (ref 0.0–0.1)
Basophils Relative: 0.6 % (ref 0.0–3.0)
Eosinophils Absolute: 0.1 10*3/uL (ref 0.0–0.7)
Eosinophils Relative: 1 % (ref 0.0–5.0)
HCT: 39.8 % (ref 39.0–52.0)
Hemoglobin: 13.2 g/dL (ref 13.0–17.0)
Lymphocytes Relative: 26.9 % (ref 12.0–46.0)
Lymphs Abs: 2 10*3/uL (ref 0.7–4.0)
MCHC: 33.2 g/dL (ref 30.0–36.0)
MCV: 93.6 fl (ref 78.0–100.0)
Monocytes Absolute: 0.6 10*3/uL (ref 0.1–1.0)
Monocytes Relative: 7.8 % (ref 3.0–12.0)
Neutro Abs: 4.9 10*3/uL (ref 1.4–7.7)
Neutrophils Relative %: 63.7 % (ref 43.0–77.0)
Platelets: 243 10*3/uL (ref 150.0–400.0)
RBC: 4.25 Mil/uL (ref 4.22–5.81)
RDW: 14.3 % (ref 11.5–15.5)
WBC: 7.6 10*3/uL (ref 4.0–10.5)

## 2020-11-13 LAB — LIPID PANEL
Cholesterol: 195 mg/dL (ref 0–200)
HDL: 64.8 mg/dL (ref >39.00)
LDL Cholesterol: 113 mg/dL — ABNORMAL HIGH (ref 0–99)
NonHDL: 130.49
Total CHOL/HDL Ratio: 3
Triglycerides: 85 mg/dL (ref 0.0–149.0)
VLDL: 17 mg/dL (ref 0.0–40.0)

## 2020-11-13 LAB — HEMOGLOBIN A1C: Hgb A1c MFr Bld: 6.2 % (ref 4.6–6.5)

## 2020-11-14 ENCOUNTER — Other Ambulatory Visit: Payer: Self-pay

## 2020-11-17 ENCOUNTER — Other Ambulatory Visit: Payer: Self-pay

## 2020-11-18 ENCOUNTER — Ambulatory Visit: Payer: Self-pay | Admitting: Urology

## 2020-11-21 DIAGNOSIS — Z96652 Presence of left artificial knee joint: Secondary | ICD-10-CM | POA: Diagnosis not present

## 2020-12-11 ENCOUNTER — Other Ambulatory Visit: Payer: Self-pay

## 2020-12-11 ENCOUNTER — Other Ambulatory Visit: Payer: PPO

## 2020-12-11 DIAGNOSIS — C61 Malignant neoplasm of prostate: Secondary | ICD-10-CM

## 2020-12-12 LAB — PSA: Prostate Specific Ag, Serum: <0.1 ng/mL (ref 0.0–4.0)

## 2020-12-15 NOTE — Progress Notes (Signed)
12/16/20 8:54 AM   Benjamin Mills [birth year] [id number]  Referring provider:  McLean-Scocuzza, Nino Glow, MD [place],  [place] [address] Chief Complaint  Patient presents with   Prostate Cancer    1year w/PSA     HPI: Benjamin Mills is a 75 y.o.male with a personal history of prostate cancer and multiple GU issues who presents today for a one year follow-up with PSA.   He was diagnosed with prostate cancer and rising PSA in 2007 at [facility] in [place]. He underwent laparoscopic radical prostatectomy. His pathology showed Gleason 3+4, [id number]. He developed evidence of biochemical recurrence with negative staging in 2/207 with a rise in his PSA of 0.2. He underwent salvage radiation with Dr. Baruch Gouty in 4.2017 and started on Lupron x1 year.   He has a history of severe ED following prostatectomy. He reports that he has experienced half an erection when he previously was not experiencing any erection. He is interested in starting back on Viagra today.   He also has a history of stress urinary incontinence following mid urethral sling placement in 2014.  He reports that he is doing well and got a new knee which he is very happy about. He states he sometimes has a short stream that does not have a good flow.  He does think that he is emptying his bladder sufficiently.  Negative hematuria work-up in 2016. Urinalysis 04/21/2020 was negative.   He was seen in the ED on 2 occasions in 12/2017 with a 4 mm right UVJ stone by CT scan.  No stone episodes.   PSA trend:   PSA on 04/24/2012 was 0.060 at [facility] PSA drawn by his PCP was 0.11 performed on 07/03/14  0.1 on 11/2014 0.2 on 05/28/15 <0.01 on 12/24/15 <0.01 on 12/2016 <0.01 on 12/2017 <0.1 on 03/2018 < 0.1 on 11/14/19  <0.1 on 12/11/20   PMH: Past Medical History:  Diagnosis Date   Amputation of finger, left    5th finger    Arthritis    hands, back lumbar sacral    Cancer ([place]) 2006   Prostate  cancer with resection radical prostate removal. Urologist Dr. Hollice Espy locally; prostate ca dx'ed in 2007 and 2015    Carotid artery stenosis    07/26/16 US carotid b/l 1-39%    Cataract    s/p surgery b/l Dr. [facility] 2018   Depression    Diverticulosis    Erectile dysfunction    Gastric ulcer    GERD (gastroesophageal reflux disease)    occ no meds   Hepatic steatosis    History of kidney stones    h/o   History of splenectomy    Hyperlipidemia    Spinal stenosis    Spinal stenosis at L4-L5 level    Thoracic aortic atherosclerosis (HCC)    Urinary stress incontinence, male     Surgical History: Past Surgical History:  Procedure Laterality Date   COLONOSCOPY     INCONTINENCE SURGERY     KNEE ARTHROSCOPY N/A 1969   meniscal tear. left    PROSTATECTOMY  2007   removal of prostate   REPAIR OF PERFORATED ULCER     2009   SPLENECTOMY  1953   after trauma, fell out tree as kid   TOTAL KNEE ARTHROPLASTY Left 04/29/2020   Procedure: TOTAL KNEE ARTHROPLASTY;  Surgeon: Corky Mull, MD;  Location: [facility] ORS;  Service: Orthopedics;  Laterality: Left;    Home Medications:  Allergies as of 12/16/2020   No Known Allergies      Medication List        Accurate as of December 16, 2020  8:54 AM. If you have any questions, ask your nurse or doctor.          DULoxetine 60 MG capsule Commonly known as: CYMBALTA Take 1 capsule (60 mg total) by mouth daily. What changed: when to take this   multivitamin capsule Take 1 capsule by mouth daily.   sildenafil 20 MG tablet Commonly known as: REVATIO 3-5 tablets as needed 1 hour prior to intercourse Started by: Hollice Espy, MD   triamcinolone ointment 0.1 % Commonly known as: KENALOG Apply 1 application topically 2 (two) times daily. Rash to leg What changed:  when to take this reasons to take this   valACYclovir 1000 MG tablet Commonly known as: VALTREX Take 1 tablet (1,000 mg total) by mouth 3  (three) times daily. What changed:  when to take this reasons to take this        Allergies: No Known Allergies  Family History: Family History  Problem Relation Age of Onset   Dementia Mother    Glaucoma Mother    Alcohol abuse Father    Glaucoma Sister    Heart disease Brother        heart valve issue   Multiple sclerosis Brother    Kidney disease Neg Hx    Prostate cancer Neg Hx     Social History:  reports that he quit smoking about 42 years ago. His smoking use included cigarettes. He has a 7.00 pack-year smoking history. He has never used smokeless tobacco. He reports current alcohol use. He reports that he does not use drugs.   Physical Exam: BP (!) 146/87   Pulse 81   Ht '5\' 8"'$  (1.727 m)   Wt 189 lb (85.7 kg)   BMI 28.74 kg/m   Constitutional:  Alert and oriented, No acute distress. HEENT: [place] AT, moist mucus membranes.  Trachea midline, no masses. Cardiovascular: No clubbing, cyanosis, or edema. Respiratory: Normal respiratory effort, no increased work of breathing. Skin: No rashes, bruises or suspicious lesions. Neurologic: Grossly intact, no focal deficits, moving all 4 extremities. Psychiatric: Normal mood and affect.  Laboratory Data:  Lab Results  Component Value Date   CREATININE 0.83 11/13/2020    Lab Results  Component Value Date   PSA <0.01 06/18/2016   PSA <0.01 12/24/2015   PSA 0.11 07/03/2014     Lab Results  Component Value Date   HGBA1C 6.2 11/13/2020    Pertinent Imaging  Results for orders placed or performed in visit on 12/16/20  BLADDER SCAN AMB NON-IMAGING  Result Value Ref Range   Scan Result 69m      Assessment & Plan:    Prostate cancer ([place] - Biochemical control after salvage radiation- no evidence of disease  - PSA undetectable  - continue to follow PSA annually  -Emptying adequately today despite some intermittency and stream  Erectile dysfunction following radical prostatectomy  - Has been experiencing  partial erections is interested in pursuing medication - Would like to start on sildenafil, prescription sent to pharmacy and coupon was provided  History of hematuria  - 04/2020 urinalysis showed no evidence of gross or microscopic blood  - no further work-up indicated at this time   History of kidney stones  - No recent stone episodes, encourages increasing hydration   Return in about 1 year (around 12/16/2021) for 1year  w/PSA prior.  I,Kailey Littlejohn,acting as a Education administrator for Hollice Espy, MD.,have documented all relevant documentation on the behalf of Hollice Espy, MD,as directed by  Hollice Espy, MD while in the presence of Hollice Espy, MD.  I have reviewed the above documentation for accuracy and completeness, and I agree with the above.   Hollice Espy, MD  [facility] [address], [place], [place] [address] [phone]

## 2020-12-16 ENCOUNTER — Ambulatory Visit: Payer: PPO | Admitting: Urology

## 2020-12-16 ENCOUNTER — Encounter: Payer: Self-pay | Admitting: Urology

## 2020-12-16 ENCOUNTER — Other Ambulatory Visit: Payer: Self-pay

## 2020-12-16 VITALS — BP 146/87 | HR 81 | Ht 68.0 in | Wt 189.0 lb

## 2020-12-16 DIAGNOSIS — C61 Malignant neoplasm of prostate: Secondary | ICD-10-CM | POA: Diagnosis not present

## 2020-12-16 LAB — BLADDER SCAN AMB NON-IMAGING

## 2020-12-16 MED ORDER — SILDENAFIL CITRATE 20 MG PO TABS: ORAL_TABLET | ORAL | 11 refills | Status: DC

## 2021-01-01 ENCOUNTER — Other Ambulatory Visit: Payer: Self-pay | Admitting: Internal Medicine

## 2021-02-17 ENCOUNTER — Ambulatory Visit (INDEPENDENT_AMBULATORY_CARE_PROVIDER_SITE_OTHER): Payer: PPO | Admitting: Internal Medicine

## 2021-02-17 ENCOUNTER — Encounter: Payer: Self-pay | Admitting: Internal Medicine

## 2021-02-17 ENCOUNTER — Telehealth: Payer: Self-pay | Admitting: Internal Medicine

## 2021-02-17 ENCOUNTER — Other Ambulatory Visit: Payer: Self-pay

## 2021-02-17 VITALS — BP 128/82 | HR 84 | Temp 96.3°F | Ht 67.99 in | Wt 189.6 lb

## 2021-02-17 DIAGNOSIS — K253 Acute gastric ulcer without hemorrhage or perforation: Secondary | ICD-10-CM

## 2021-02-17 DIAGNOSIS — R6881 Early satiety: Secondary | ICD-10-CM

## 2021-02-17 DIAGNOSIS — R1013 Epigastric pain: Secondary | ICD-10-CM

## 2021-02-17 DIAGNOSIS — Z1211 Encounter for screening for malignant neoplasm of colon: Secondary | ICD-10-CM | POA: Diagnosis not present

## 2021-02-17 LAB — URINALYSIS, ROUTINE W REFLEX MICROSCOPIC
Bilirubin Urine: NEGATIVE
Hgb urine dipstick: NEGATIVE
Ketones, ur: NEGATIVE
Leukocytes,Ua: NEGATIVE
Nitrite: NEGATIVE
RBC / HPF: NONE SEEN (ref 0–?)
Specific Gravity, Urine: 1.02 (ref 1.000–1.030)
Total Protein, Urine: NEGATIVE
Urine Glucose: NEGATIVE
Urobilinogen, UA: 0.2 (ref 0.0–1.0)
pH: 7.5 (ref 5.0–8.0)

## 2021-02-17 LAB — COMPREHENSIVE METABOLIC PANEL
ALT: 17 U/L (ref 0–53)
AST: 22 U/L (ref 0–37)
Albumin: 4.1 g/dL (ref 3.5–5.2)
Alkaline Phosphatase: 85 U/L (ref 39–117)
BUN: 24 mg/dL — ABNORMAL HIGH (ref 6–23)
CO2: 26 mEq/L (ref 19–32)
Calcium: 9.3 mg/dL (ref 8.4–10.5)
Chloride: 104 mEq/L (ref 96–112)
Creatinine, Ser: 0.74 mg/dL (ref 0.40–1.50)
GFR: 88.44 mL/min (ref >60.00)
Glucose, Bld: 91 mg/dL (ref 70–99)
Potassium: 4.3 mEq/L (ref 3.5–5.1)
Sodium: 138 mEq/L (ref 135–145)
Total Bilirubin: 0.6 mg/dL (ref 0.2–1.2)
Total Protein: 6.8 g/dL (ref 6.0–8.3)

## 2021-02-17 LAB — CBC WITH DIFFERENTIAL/PLATELET
Basophils Absolute: 0.1 10*3/uL (ref 0.0–0.1)
Basophils Relative: 0.7 % (ref 0.0–3.0)
Eosinophils Absolute: 0.3 10*3/uL (ref 0.0–0.7)
Eosinophils Relative: 3.3 % (ref 0.0–5.0)
HCT: 41.7 % (ref 39.0–52.0)
Hemoglobin: 13.6 g/dL (ref 13.0–17.0)
Lymphocytes Relative: 19.5 % (ref 12.0–46.0)
Lymphs Abs: 1.6 10*3/uL (ref 0.7–4.0)
MCHC: 32.6 g/dL (ref 30.0–36.0)
MCV: 94.1 fl (ref 78.0–100.0)
Monocytes Absolute: 0.5 10*3/uL (ref 0.1–1.0)
Monocytes Relative: 6.5 % (ref 3.0–12.0)
Neutro Abs: 5.7 10*3/uL (ref 1.4–7.7)
Neutrophils Relative %: 70 % (ref 43.0–77.0)
Platelets: 302 10*3/uL (ref 150.0–400.0)
RBC: 4.44 Mil/uL (ref 4.22–5.81)
RDW: 12.8 % (ref 11.5–15.5)
WBC: 8.1 10*3/uL (ref 4.0–10.5)

## 2021-02-17 LAB — TROPONIN I (HIGH SENSITIVITY): High Sens Troponin I: 4 ng/L (ref 2–17)

## 2021-02-17 MED ORDER — OMEPRAZOLE 40 MG PO CPDR: 40.0000 mg | DELAYED_RELEASE_CAPSULE | Freq: Every day | ORAL | 3 refills | Status: DC

## 2021-02-17 NOTE — Progress Notes (Signed)
Chief Complaint  Patient presents with   Follow-up   Abdominal Pain   F/u  1. Epigastric pain on and off x 2 weeks and feeling full like stomach going to explode 6-7/10 last time this happened "Sunday. He feels bloated though not overeating h/o stomach ulcer perforation and repair in 2008/2009 Colonoscopy also due  Tried peptobismol otc  Pain worse with food   Review of Systems  Constitutional:  Negative for weight loss.  HENT:  Negative for hearing loss.   Eyes:  Negative for blurred vision.  Respiratory:  Negative for shortness of breath.   Cardiovascular:  Negative for chest pain.  Gastrointestinal:  Positive for abdominal pain. Negative for blood in stool, nausea and vomiting.  Musculoskeletal:  Negative for back pain.  Skin:  Negative for rash.  Neurological:  Negative for headaches.  Psychiatric/Behavioral:  Negative for depression.   Past Medical History:  Diagnosis Date   Amputation of finger, left    5th finger    Arthritis    hands, back lumbar sacral    Cancer (HCC) 2006   Prostate cancer with resection radical prostate removal. Urologist Dr. Ashley Brandon locally; prostate ca dx'ed in 2007 and 2015    Carotid artery stenosis    07/26/16 US carotid b/l 1-39%    Cataract    s/p surgery b/l Dr. [facility] 2018   Depression    Diverticulosis    Erectile dysfunction    Gastric ulcer    GERD (gastroesophageal reflux disease)    occ no meds   Hepatic steatosis    History of kidney stones    h/o   History of splenectomy    Hyperlipidemia    Spinal stenosis    Spinal stenosis at L4-L5 level    Thoracic aortic atherosclerosis (HCC)    Urinary stress incontinence, male    Past Surgical History:  Procedure Laterality Date   COLONOSCOPY     INCONTINENCE SURGERY     KNEE ARTHROSCOPY N/A 1969   meniscal tear. left    PROSTATECTOMY  2007   removal of prostate   REPAIR OF PERFORATED ULCER     20" 09   SPLENECTOMY  1953   after trauma, fell out tree as  kid   TOTAL KNEE ARTHROPLASTY Left 04/29/2020   Procedure: TOTAL KNEE ARTHROPLASTY;  Surgeon: Corky Mull, MD;  Location: [facility] ORS;  Service: Orthopedics;  Laterality: Left;   Family History  Problem Relation Age of Onset   Dementia Mother    Glaucoma Mother    Alcohol abuse Father    Glaucoma Sister    Heart disease Brother        heart valve issue   Multiple sclerosis Brother    Kidney disease Neg Hx    Prostate cancer Neg Hx    Social History   Socioeconomic History   Marital status: Married    Spouse name: Not on file   Number of children: Not on file   Years of education: Not on file   Highest education level: Not on file  Occupational History   Not on file  Tobacco Use   Smoking status: Former    Packs/day: 1.00    Years: 7.00    Pack years: 7.00    Types: Cigarettes    Quit date: 06/05/1978    Years since quitting: 42.7   Smokeless tobacco: Never   Tobacco comments:    quit in 1980s duration 6-7 YRS 1 ppd no FH lung cancer  Vaping Use   Vaping Use: Never used  Substance and Sexual Activity   Alcohol use: Yes    Alcohol/week: 0.0 standard drinks    Comment: [place] [employer]   Drug use: No   Sexual activity: Not Currently  Other Topics Concern   Not on file  Social History Narrative   Born in [place].   Lived in [place].    Moved to [facility], brother lives in [place]       Has 2 brothers and 1 sister.      Lives with wife in [place]. Has 3 daughters.      Work - retired, Surveyor, quantity      Diet - regular      Exercise - walks occasionally   Social Determinants of Radio broadcast assistant Strain: Not on file  Food Insecurity: Not on file  Transportation Needs: Not on file  Physical Activity: Not on file  Stress: Not on file  Social Connections: Not on file  Intimate Partner Violence: Not on file   Current Meds  Medication Sig   DULoxetine (CYMBALTA) 60 MG capsule Take 1 capsule by mouth once daily   Multiple Vitamin  (MULTIVITAMIN) capsule Take 1 capsule by mouth daily.   omeprazole (PRILOSEC) 40 MG capsule Take 1 capsule (40 mg total) by mouth daily. 30 minutes before food   sildenafil (REVATIO) 20 MG tablet 3-5 tablets as needed 1 hour prior to intercourse   triamcinolone ointment (KENALOG) 0.1 % Apply 1 application topically 2 (two) times daily. Rash to leg (Patient taking differently: Apply 1 application topically 2 (two) times daily as needed. Rash to leg)   valACYclovir (VALTREX) 1000 MG tablet Take 1 tablet (1,000 mg total) by mouth 3 (three) times daily. (Patient taking differently: Take 1,000 mg by mouth 3 (three) times daily as needed (cold sores).)   No Known Allergies Recent Results (from the past 2160 hour(s))  PSA     Status: None   Collection Time: 12/11/20  1:52 PM  Result Value Ref Range   Prostate Specific Ag, Serum <0.1 0.0 - 4.0 ng/mL    Comment: Roche ECLIA methodology. According to the American Urological Association, Serum PSA should decrease and remain at undetectable levels after radical prostatectomy. The AUA defines biochemical recurrence as an initial PSA value 0.2 ng/mL or greater followed by a subsequent confirmatory PSA value 0.2 ng/mL or greater. Values obtained with different assay methods or kits cannot be used interchangeably. Results cannot be interpreted as absolute evidence of the presence or absence of malignant disease.   BLADDER SCAN AMB NON-IMAGING     Status: None   Collection Time: 12/16/20  8:48 AM  Result Value Ref Range   Scan Result 73ml    Objective  Body mass index is 28.84 kg/m. Wt Readings from Last 3 Encounters:  02/17/21 189 lb 9.6 oz (86 kg)  12/16/20 189 lb (85.7 kg)  10/17/20 188 lb 1.9 oz (85.3 kg)   Temp Readings from Last 3 Encounters:  02/17/21 (!) 96.3 F (35.7 C)  10/17/20 98 F (36.7 C) (Oral)  04/29/20 98 F (36.7 C) (Temporal)   BP Readings from Last 3 Encounters:  02/17/21 128/82  12/16/20 (!) 146/87  10/17/20 140/80    Pulse Readings from Last 3 Encounters:  02/17/21 84  12/16/20 81  10/17/20 77    Physical Exam Vitals and nursing note reviewed.  Constitutional:      Appearance: Normal appearance. He is well-developed and well-groomed. He is  obese.  HENT:     Head: Normocephalic and atraumatic.  Eyes:     Conjunctiva/sclera: Conjunctivae normal.     Pupils: Pupils are equal, round, and reactive to light.  Cardiovascular:     Rate and Rhythm: Normal rate and regular rhythm.     Heart sounds: Normal heart sounds.  Pulmonary:     Effort: Pulmonary effort is normal. No respiratory distress.     Breath sounds: Normal breath sounds.  Abdominal:     Tenderness: There is no abdominal tenderness.  Musculoskeletal:     Lumbar back: Tenderness present. Negative right straight leg raise test and negative left straight leg raise test.  Skin:    General: Skin is warm and moist.  Neurological:     General: No focal deficit present.     Mental Status: He is alert and oriented to person, place, and time. Mental status is at baseline.     Sensory: Sensation is intact.     Motor: Motor function is intact.     Coordination: Coordination is intact.     Gait: Gait is intact. Gait normal.  Psychiatric:        Attention and Perception: Attention and perception normal.        Mood and Affect: Mood and affect normal.        Speech: Speech normal.        Behavior: Behavior normal. Behavior is cooperative.        Thought Content: Thought content normal.        Cognition and Memory: Cognition and memory normal.        Judgment: Judgment normal.    Assessment  Plan  Epigastric pain h/o perforated ulcer s/p repair 2008/9/early satiety- Plan: CT Abdomen Pelvis W Contrast, Comprehensive metabolic panel, CBC w/Diff, Urinalysis, Routine w reflex microscopic, omeprazole (PRILOSEC) 40 MG capsule qd, Ambulatory referral to Gastroenterology  Encounter for screening colonoscopy - Plan: Ambulatory referral to  Gastroenterology   HM Had flu shot due Tdap 2015 Had prevnar and pna 23  shingrix Rx has not had yet given tx today   covid 2/2 pfizer sch 04/10/20 3rd dose call back with date Fasting labs 11/2020    Need to get colonoscopy record just have nl per report in chart given release of records pt signed again today to get from [place] need record    Fu urology. Rad onc appts h/o prostate cancer PSA neg 12/2017 due to f/u 03/2019 <0.01 negative      Ref. Range 11/14/2019 10:35  Prostate Specific Ag, Serum Latest Ref Range: 0.0 - 4.0 ng/mL <0.1   f/u urology 11/2020    dermatology established with Roe Coombs seen 2020  seen end 06/2019 or 07/2019 doing well no tx or bx f/u 1 year   Hep C neg 04/08/17   Former smoker quit in 1980s smoked x 6-7 years no FH lung cancer smoked 1 ppd  Provider: Dr. Olivia Mackie McLean-Scocuzza-Internal Medicine

## 2021-02-17 NOTE — Patient Instructions (Addendum)
  Dr. Idalia Needle gastroenterology   Phone Fax E-mail Address  [phone] Not available [place] [address]           Abdominal Pain, Adult Pain in the abdomen (abdominal pain) can be caused by many things. Often, abdominal pain is not serious and it gets better with no treatment or by being treated at home. However, sometimes abdominal pain is serious. Your health care provider will ask questions about your medical history and do a physical exam to try to determine the cause of your abdominal pain. Follow these instructions at home: Medicines Take over-the-counter and prescription medicines only as told by your health care provider. Do not take a laxative unless told by your health care provider. General instructions  Watch your condition for any changes. Drink enough fluid to keep your urine pale yellow. Keep all follow-up visits as told by your health care provider. This is important. Contact a health care provider if: Your abdominal pain changes or gets worse. You are not hungry or you lose weight without trying. You are constipated or have diarrhea for more than 2-3 days. You have pain when you urinate or have a bowel movement. Your abdominal pain wakes you up at night. Your pain gets worse with meals, after eating, or with certain foods. You are vomiting and cannot keep anything down. You have a fever. You have blood in your urine. Get help right away if: Your pain does not go away as soon as your health care provider told you to expect. You cannot stop vomiting. Your pain is only in areas of the abdomen, such as the right side or the left lower portion of the abdomen. Pain on the right side could be caused by appendicitis. You have bloody or black stools, or stools that look like tar. You have severe pain, cramping, or bloating in your abdomen. You have signs of dehydration, such as: Dark urine, very little urine, or no  urine. Cracked lips. Dry mouth. Sunken eyes. Sleepiness. Weakness. You have trouble breathing or chest pain. Summary Often, abdominal pain is not serious and it gets better with no treatment or by being treated at home. However, sometimes abdominal pain is serious. Watch your condition for any changes. Take over-the-counter and prescription medicines only as told by your health care provider. Contact a health care provider if your abdominal pain changes or gets worse. Get help right away if you have severe pain, cramping, or bloating in your abdomen. This information is not intended to replace advice given to you by your health care provider. Make sure you discuss any questions you have with your health care provider. Document Revised: 05/18/2019 Document Reviewed: 08/07/2018 Elsevier Patient Education  [place].

## 2021-02-17 NOTE — Telephone Encounter (Signed)
I left pt vm with appt time date and location. Appt 02/17/2021 arrive at 2:15 pm at [facility] in [place]. Thanks

## 2021-02-18 ENCOUNTER — Ambulatory Visit (HOSPITAL_BASED_OUTPATIENT_CLINIC_OR_DEPARTMENT_OTHER)
Admission: RE | Admit: 2021-02-18 | Discharge: 2021-02-18 | Disposition: A | Discharge status: Home / Self Care | Payer: PPO | Source: Ambulatory Visit | Attending: Internal Medicine | Admitting: Internal Medicine

## 2021-02-18 DIAGNOSIS — R6881 Early satiety: Secondary | ICD-10-CM | POA: Insufficient documentation

## 2021-02-18 DIAGNOSIS — K5730 Diverticulosis of large intestine without perforation or abscess without bleeding: Secondary | ICD-10-CM | POA: Diagnosis not present

## 2021-02-18 DIAGNOSIS — R1013 Epigastric pain: Secondary | ICD-10-CM | POA: Insufficient documentation

## 2021-02-18 DIAGNOSIS — I700 Atherosclerosis of aorta: Secondary | ICD-10-CM | POA: Diagnosis not present

## 2021-02-18 DIAGNOSIS — K5900 Constipation, unspecified: Secondary | ICD-10-CM | POA: Diagnosis not present

## 2021-02-18 MED ORDER — IOHEXOL 300 MG/ML  SOLN
100.0000 mL | Freq: Once | INTRAMUSCULAR | Status: AC | PRN
  Administered 2021-02-18: 100 mL via INTRAVENOUS

## 2021-02-19 ENCOUNTER — Telehealth: Payer: Self-pay | Admitting: Internal Medicine

## 2021-02-19 NOTE — Telephone Encounter (Signed)
Per [facility] inform pt  Hello! Just reviewed your note and the overall unremarkable CT scan findings. He would probably benefit from a bowel purge over the weekend with combining 8-10 capfuls of Miralax in 32 ounces of Gatorade to empty him out, continue PPI, and we are reaching out about arranging f/u with me next week. Just wanted to keep you updated. Thanks!   Octavia Bruckner, PA-C

## 2021-02-24 NOTE — Telephone Encounter (Signed)
Patient informed and verbalized understanding.  Patient states Gi has reached out and scheduled his follow up

## 2021-03-11 ENCOUNTER — Ambulatory Visit (INDEPENDENT_AMBULATORY_CARE_PROVIDER_SITE_OTHER): Payer: PPO | Admitting: Podiatry

## 2021-03-11 DIAGNOSIS — Z91199 Patient's noncompliance with other medical treatment and regimen due to unspecified reason: Secondary | ICD-10-CM

## 2021-03-11 NOTE — Progress Notes (Signed)
Patient was no show for appt

## 2021-03-16 ENCOUNTER — Other Ambulatory Visit: Payer: Self-pay | Admitting: Internal Medicine

## 2021-04-21 DIAGNOSIS — Z8711 Personal history of peptic ulcer disease: Secondary | ICD-10-CM | POA: Diagnosis not present

## 2021-04-21 DIAGNOSIS — Z1211 Encounter for screening for malignant neoplasm of colon: Secondary | ICD-10-CM | POA: Diagnosis not present

## 2021-04-21 DIAGNOSIS — R140 Abdominal distension (gaseous): Secondary | ICD-10-CM | POA: Diagnosis not present

## 2021-05-02 ENCOUNTER — Encounter: Payer: Self-pay | Admitting: Internal Medicine

## 2021-05-05 ENCOUNTER — Other Ambulatory Visit: Payer: Self-pay

## 2021-05-05 ENCOUNTER — Encounter: Payer: Self-pay | Admitting: Internal Medicine

## 2021-05-05 ENCOUNTER — Other Ambulatory Visit: Payer: Self-pay | Admitting: Internal Medicine

## 2021-05-05 MED ORDER — VALACYCLOVIR HCL 1 G PO TABS: 1000.0000 mg | ORAL_TABLET | Freq: Three times a day (TID) | ORAL | 0 refills | Status: DC

## 2021-05-05 MED ORDER — VALACYCLOVIR HCL 1 G PO TABS: 1000.0000 mg | ORAL_TABLET | Freq: Two times a day (BID) | ORAL | 3 refills | Status: DC

## 2021-06-08 DIAGNOSIS — Z1211 Encounter for screening for malignant neoplasm of colon: Secondary | ICD-10-CM | POA: Diagnosis not present

## 2021-06-08 DIAGNOSIS — K5730 Diverticulosis of large intestine without perforation or abscess without bleeding: Secondary | ICD-10-CM | POA: Diagnosis not present

## 2021-06-08 DIAGNOSIS — K640 First degree hemorrhoids: Secondary | ICD-10-CM | POA: Diagnosis not present

## 2021-06-08 DIAGNOSIS — D120 Benign neoplasm of cecum: Secondary | ICD-10-CM | POA: Diagnosis not present

## 2021-06-18 LAB — HM COLONOSCOPY

## 2021-06-19 ENCOUNTER — Encounter: Payer: Self-pay | Admitting: Internal Medicine

## 2021-06-24 DIAGNOSIS — H40013 Open angle with borderline findings, low risk, bilateral: Secondary | ICD-10-CM | POA: Diagnosis not present

## 2021-06-24 DIAGNOSIS — Z961 Presence of intraocular lens: Secondary | ICD-10-CM | POA: Diagnosis not present

## 2021-06-24 DIAGNOSIS — H04123 Dry eye syndrome of bilateral lacrimal glands: Secondary | ICD-10-CM | POA: Diagnosis not present

## 2021-06-24 DIAGNOSIS — H43813 Vitreous degeneration, bilateral: Secondary | ICD-10-CM | POA: Diagnosis not present

## 2021-06-26 DIAGNOSIS — F4320 Adjustment disorder, unspecified: Secondary | ICD-10-CM | POA: Diagnosis not present

## 2021-07-01 ENCOUNTER — Other Ambulatory Visit: Payer: Self-pay | Admitting: Internal Medicine

## 2021-07-07 DIAGNOSIS — D485 Neoplasm of uncertain behavior of skin: Secondary | ICD-10-CM | POA: Diagnosis not present

## 2021-07-07 DIAGNOSIS — D2261 Melanocytic nevi of right upper limb, including shoulder: Secondary | ICD-10-CM | POA: Diagnosis not present

## 2021-07-07 DIAGNOSIS — Z872 Personal history of diseases of the skin and subcutaneous tissue: Secondary | ICD-10-CM | POA: Diagnosis not present

## 2021-07-07 DIAGNOSIS — Z85828 Personal history of other malignant neoplasm of skin: Secondary | ICD-10-CM | POA: Diagnosis not present

## 2021-07-07 DIAGNOSIS — D2262 Melanocytic nevi of left upper limb, including shoulder: Secondary | ICD-10-CM | POA: Diagnosis not present

## 2021-07-07 DIAGNOSIS — L820 Inflamed seborrheic keratosis: Secondary | ICD-10-CM | POA: Diagnosis not present

## 2021-07-07 DIAGNOSIS — D2272 Melanocytic nevi of left lower limb, including hip: Secondary | ICD-10-CM | POA: Diagnosis not present

## 2021-09-14 ENCOUNTER — Telehealth: Payer: Self-pay | Admitting: Internal Medicine

## 2021-09-14 NOTE — Telephone Encounter (Signed)
Spoke with patient he state they had lots of appts and to call back end on June 2023

## 2021-09-25 ENCOUNTER — Telehealth: Payer: Self-pay | Admitting: Family

## 2021-10-01 NOTE — Telephone Encounter (Signed)
Pt need refill on DULoxetine sent to [facility]

## 2021-10-07 ENCOUNTER — Other Ambulatory Visit: Payer: Self-pay | Admitting: Internal Medicine

## 2021-10-07 MED ORDER — DULOXETINE HCL 60 MG PO CPEP: 60.0000 mg | ORAL_CAPSULE | Freq: Every day | ORAL | 3 refills | Status: DC

## 2021-10-08 DIAGNOSIS — M2042 Other hammer toe(s) (acquired), left foot: Secondary | ICD-10-CM | POA: Diagnosis not present

## 2021-10-08 DIAGNOSIS — G5762 Lesion of plantar nerve, left lower limb: Secondary | ICD-10-CM | POA: Diagnosis not present

## 2021-10-08 DIAGNOSIS — M79672 Pain in left foot: Secondary | ICD-10-CM | POA: Diagnosis not present

## 2021-10-08 DIAGNOSIS — M2012 Hallux valgus (acquired), left foot: Secondary | ICD-10-CM | POA: Diagnosis not present

## 2021-11-10 DIAGNOSIS — F4320 Adjustment disorder, unspecified: Secondary | ICD-10-CM | POA: Diagnosis not present

## 2021-11-24 ENCOUNTER — Ambulatory Visit (INDEPENDENT_AMBULATORY_CARE_PROVIDER_SITE_OTHER): Payer: PPO

## 2021-11-24 ENCOUNTER — Telehealth: Payer: Self-pay

## 2021-11-24 VITALS — Ht 67.99 in | Wt 189.0 lb

## 2021-11-24 DIAGNOSIS — Z0000 Encounter for general adult medical examination without abnormal findings: Secondary | ICD-10-CM | POA: Diagnosis not present

## 2021-11-24 NOTE — Telephone Encounter (Signed)
Unable to reach patient for scheduled AWV. No answer. Left message to reschedule.  

## 2021-11-24 NOTE — Patient Instructions (Addendum)
  Benjamin Mills , Thank you for taking time to come for your Medicare Wellness Visit. I appreciate your ongoing commitment to your health goals. Please review the following plan we discussed and let me know if I can assist you in the future.   These are the goals we discussed:  Goals      Healthy Lifestyle     Stay active. Stay hydrated. Healthy diet.        This is a list of the screening recommended for you and due dates:  Health Maintenance  Topic Date Due   Flu Shot  11/10/2021   COVID-19 Vaccine (3 - Pfizer risk series) 12/10/2021*   Zoster (Shingles) Vaccine (1 of 2) 02/24/2022*   Tetanus Vaccine  04/13/2023   Pneumonia Vaccine  Completed   Hepatitis C Screening: USPSTF Recommendation to screen - Ages 38-79 yo.  Completed   HPV Vaccine  Aged Out   Colon Cancer Screening  Discontinued  *Topic was postponed. The date shown is not the original due date.

## 2021-11-24 NOTE — Progress Notes (Signed)
Subjective:   Benjamin Mills is a 76 y.o. male who presents for Medicare Annual/Subsequent preventive examination.  Review of Systems    No ROS.  Medicare Wellness Virtual Visit.  Visual/audio telehealth visit, UTA vital signs.   See social history for additional risk factors.   Cardiac Risk Factors include: advanced age (>86mn, >>31women);hypertension     Objective:    Today's Vitals   11/24/21 1545  Weight: 189 lb (85.7 kg)  Height: 5' 7.99" (1.727 m)   Body mass index is 28.75 kg/m.     11/24/2021    3:50 PM 04/29/2020    6:26 AM 04/21/2020    2:14 PM 09/14/2019    6:15 PM 01/03/2019    9:08 AM 01/04/2018    9:03 AM 12/18/2017   11:33 AM  Advanced Directives  Does Patient Have a Medical Advance Directive? No No Yes No No No No  Does patient want to make changes to medical advance directive?  No - Patient declined       Would patient like information on creating a medical advance directive? No - Patient declined No - Patient declined   No - Patient declined No - Patient declined No - Patient declined    Current Medications (verified) Outpatient Encounter Medications as of 11/24/2021  Medication Sig   DULoxetine (CYMBALTA) 60 MG capsule Take 1 capsule (60 mg total) by mouth daily.   Multiple Vitamin (MULTIVITAMIN) capsule Take 1 capsule by mouth daily.   omeprazole (PRILOSEC) 40 MG capsule Take 1 capsule (40 mg total) by mouth daily. 30 minutes before food   sildenafil (REVATIO) 20 MG tablet 3-5 tablets as needed 1 hour prior to intercourse   triamcinolone ointment (KENALOG) 0.1 % Apply 1 application topically 2 (two) times daily. Rash to leg (Patient taking differently: Apply 1 application topically 2 (two) times daily as needed. Rash to leg)   valACYclovir (VALTREX) 1000 MG tablet Take 1 tablet (1,000 mg total) by mouth 2 (two) times daily. X 3-7 days outbreak with food   No facility-administered encounter medications on file as of 11/24/2021.   Allergies  (verified) Patient has no known allergies.   History: Past Medical History:  Diagnosis Date   Amputation of finger, left    5th finger    Arthritis    hands, back lumbar sacral    [place]   Prostate cancer with resection radical prostate removal. Urologist Dr. AHollice Espylocally; prostate ca dx'ed in 2007 and 2015    Carotid artery stenosis    07/[place] 1[place]39[place]    Cataract    s/p surgery b/l Dr. [facility] 2018   Depression    Diverticulosis    Erectile dysfunction    Gastric ulcer    GERD (gastroesophageal reflux disease)    occ no meds   Hepatic steatosis    History of kidney stones    h/o   History of splenectomy    Hyperlipidemia    Spinal stenosis    Spinal stenosis at L4-L5 level    Thoracic aortic atherosclerosis (HCC)    Urinary stress incontinence, male    Past Surgical History:  Procedure Laterality Date   COLONOSCOPY     INCONTINENCE SURGERY     KNEE ARTHROSCOPY N/A 1969   meniscal tear. left    PROSTATECTOMY  2007   removal of prostate   REPAIR OF PERFORATED ULCER     2009   SPLENECTOMY  1953  after trauma, fell out tree as kid   TOTAL KNEE ARTHROPLASTY Left 04/29/2020   Procedure: TOTAL KNEE ARTHROPLASTY;  Surgeon: Corky Mull, MD;  Location: [facility] ORS;  Service: Orthopedics;  Laterality: Left;   Family History  Problem Relation Age of Onset   Dementia Mother    Glaucoma Mother    Alcohol abuse Father    Glaucoma Sister    Heart disease Brother        heart valve issue   Multiple sclerosis Brother    Kidney disease Neg Hx    Prostate cancer Neg Hx    Social History   Socioeconomic History   Marital status: Married    Spouse name: Not on file   Number of children: Not on file   Years of education: Not on file   Highest education level: Not on file  Occupational History   Not on file  Tobacco Use   Smoking status: Former    Packs/day: 1.00    Years: 7.00    Total pack years: 7.00    Types:  Cigarettes    Quit date: 06/05/1978    Years since quitting: 43.5   Smokeless tobacco: Never   Tobacco comments:    quit in 1980s duration 6-7 YRS 1 ppd no FH lung cancer   Vaping Use   Vaping Use: Never used  Substance and Sexual Activity   Alcohol use: Yes    Alcohol/week: 0.0 standard drinks of alcohol    Comment: [place] [employer]   Drug use: No   Sexual activity: Not Currently  Other Topics Concern   Not on file  Social History Narrative   Born in [place].   Lived in [place].    Moved to [facility], brother lives in [place]       Has 2 brothers and 1 sister.      Lives with wife in [place]. Has 3 daughters.      Work - retired, Surveyor, quantity      Diet - regular      Exercise - walks occasionally   Social Determinants of Health   Financial Resource Strain: Low Risk  (11/24/2021)   Overall Financial Resource Strain (CARDIA)    Difficulty of Paying Living Expenses: Not hard at all  Food Insecurity: No Food Insecurity (11/24/2021)   Hunger Vital Sign    Worried About Running Out of Food in the Last Year: Never true    Ran Out of Food in the Last Year: Never true  Transportation Needs: No Transportation Needs (11/24/2021)   PRAPARE - Hydrologist (Medical): No    Lack of Transportation (Non-Medical): No  Physical Activity: Insufficiently Active (11/24/2021)   Exercise Vital Sign    Days of Exercise per Week: 4 days    Minutes of Exercise per Session: 30 min  Stress: No Stress Concern Present (11/24/2021)   [place]    Feeling of Stress : Not at all  Social Connections: Unknown (11/24/2021)   Social Connection and Isolation Panel [NHANES]    Frequency of Communication with Friends and Family: Not on file    Frequency of Social Gatherings with Friends and Family: Not on file    Attends Religious Services: Not on file    Active Member of Clubs or Organizations: Not on file     Attends Archivist Meetings: Not on file    Marital Status: Married   Tobacco Counseling  Counseling given: Not Answered Tobacco comments: quit in 1980s duration 6-7 YRS 1 ppd no FH lung cancer   Clinical Intake: Pre-visit preparation completed: Yes        Diabetes: No  How often do you need to have someone help you when you read instructions, pamphlets, or other written materials from your doctor or pharmacy?: 1 - Never  Interpreter Needed?: No    Activities of Daily Living    11/24/2021    3:48 PM  In your present state of health, do you have any difficulty performing the following activities:  Hearing? 0  Vision? 0  Difficulty concentrating or making decisions? 0  Walking or climbing stairs? 0  Dressing or bathing? 0  Doing errands, shopping? 0  Preparing Food and eating ? N  Using the Toilet? N  In the past six months, have you accidently leaked urine? N  Do you have problems with loss of bowel control? N  Managing your Medications? N  Managing your Finances? N  Housekeeping or managing your Housekeeping? N   Patient Care Team: McLean-Scocuzza, Nino Glow, MD as PCP - General (Internal Medicine)  Indicate any recent Medical Services you may have received from other than [facility] providers in the past year (date may be approximate).     Assessment:   This is a routine wellness examination for Mann.  Virtual Visit via Telephone Note  I connected with  [name] on 11/24/21 at  3:30 PM EDT by telephone and verified that I am speaking with the correct person using two identifiers.  Location: Patient: home Provider: office Persons participating in the virtual visit: patient/Nurse Health Advisor   I discussed the limitations of performing an evaluation and management service by telehealth. We continued and completed visit with audio only. Some vital signs may be absent or patient reported.   Hearing/Vision screen Hearing Screening - Comments::  Patient is able to hear conversational tones without difficulty.  No issues reported. Vision Screening - Comments:: Followed by [facility], Dr. Oralia Manis Cataracts extracted, bilateral Wears glasses   Dietary issues and exercise activities discussed: Current Exercise Habits: Home exercise routine, Time (Minutes): 30, Frequency (Times/Week): 4, Weekly Exercise (Minutes/Week): 120, Intensity: Mild Regular diet Good water intake   Goals Addressed             This Visit's Progress    Healthy Lifestyle       Stay active. Stay hydrated. Healthy diet.       Depression Screen    11/24/2021    3:51 PM 10/17/2020   11:13 AM 04/08/2020    2:16 PM 07/31/2019    8:28 AM 01/30/2019    9:31 AM 10/25/2018   10:00 AM 08/16/2017    2:23 PM  PHQ 2/9 Scores  PHQ - 2 Score 0 0 0 0 0 0 0    Fall Risk    11/24/2021    3:52 PM 02/17/2021    9:00 AM 10/17/2020   11:13 AM 04/08/2020    2:16 PM 02/01/2020    8:54 AM  [place] in the past year? 0 1 0 0 0  Number falls in past yr: 0 1 0 0 0  Injury with Fall?  0  0 0  Follow up Falls evaluation completed Falls evaluation completed Falls evaluation completed Falls evaluation completed Falls evaluation completed   [place]: Home free of loose throw rugs in walkways, pet beds, electrical cords,  etc? Yes  Adequate lighting in your home to reduce risk of falls? Yes   ASSISTIVE DEVICES UTILIZED TO PREVENT FALLS: Life alert? No  Use of a cane, walker or w/c? No   TIMED UP AND GO: Was the test performed? No .   Cognitive Function: Patient is alert and oriented x3.  Manages his own medications and finances. Denies difficulty focusing, making decisions, memory loss.  100% independent.     04/08/2016   11:25 AM 04/09/2015   11:30 AM  MMSE - Mini Mental State Exam  Orientation to time 5 5  Orientation to Place 5 5  Registration 3 3  Attention/ Calculation 5 5  Recall 3 3  Language- name 2  objects 2 2  Language- repeat 1 1  Language- follow 3 step command 3 3  Language- read & follow direction 1 1  Write a sentence 1 1  Copy design 1 1  Total score 30 30       Immunizations Immunization History  Administered Date(s) Administered   Fluad Quad(high Dose 65+) 02/01/2020   Influenza, High Dose Seasonal PF 01/27/2014, 01/19/2016, 01/26/2017, 02/17/2018, 12/28/2018   Influenza,inj,Quad PF,6+ Mos 01/30/2015   Influenza-Unspecified 02/02/2014, 01/30/2015, 01/26/2017   PFIZER(Purple Top)SARS-COV-2 Vaccination 05/24/2019, 06/20/2019   Pneumococcal Conjugate-13 04/08/2016   Pneumococcal Polysaccharide-23 06/05/2012   Tdap 04/12/2013   Screening Tests Health Maintenance  Topic Date Due   INFLUENZA VACCINE  11/10/2021   COVID-19 Vaccine (3 - Pfizer risk series) 12/10/2021 (Originally 07/18/2019)   Zoster Vaccines- Shingrix (1 of 2) 02/24/2022 (Originally 04/25/1964)   TETANUS/TDAP  04/13/2023   Pneumonia Vaccine 5+ Years old  Completed   Hepatitis C Screening  Completed   HPV VACCINES  Aged Out   COLONOSCOPY (Pts 45-81yr Insurance coverage will need to be confirmed)  Discontinued   Health Maintenance Health Maintenance Due  Topic Date Due   INFLUENZA VACCINE  11/10/2021   Lung Cancer Screening: (Low Dose CT Chest recommended if Age 76-80years, 30 pack-year currently smoking OR have quit w/in 15years.) does not qualify.  quit in 1980s duration 6-7 YRS 1 ppd no FH lung cancer   Vision Screening: Recommended annual ophthalmology exams for early detection of glaucoma and other disorders of the eye.  Dental Screening: Recommended annual dental exams for proper oral hygiene  Community Resource Referral / Chronic Care Management: CRR required this visit?  No   CCM required this visit?  No      Plan:   Keep all routine maintenance appointments.   I have personally reviewed and noted the following in the patient's chart:   Medical and social history Use of alcohol,  tobacco or illicit drugs  Current medications and supplements including opioid prescriptions. Patient is not currently taking opioid prescriptions. Functional ability and status Nutritional status Physical activity Advanced directives List of other physicians Hospitalizations, surgeries, and ER visits in previous 12 months Vitals Screenings to include cognitive, depression, and falls Referrals and appointments  In addition, I have reviewed and discussed with patient certain preventive protocols, quality metrics, and best practice recommendations. A written personalized care plan for preventive services as well as general preventive health recommendations were provided to patient.   OVarney Biles LPN   [id number]

## 2021-12-11 ENCOUNTER — Other Ambulatory Visit: Payer: PPO

## 2021-12-11 ENCOUNTER — Encounter: Payer: Self-pay | Admitting: Urology

## 2021-12-11 DIAGNOSIS — C61 Malignant neoplasm of prostate: Secondary | ICD-10-CM | POA: Diagnosis not present

## 2021-12-12 LAB — PSA: Prostate Specific Ag, Serum: <0.1 ng/mL (ref 0.0–4.0)

## 2021-12-16 ENCOUNTER — Encounter: Payer: Self-pay | Admitting: Urology

## 2021-12-16 ENCOUNTER — Ambulatory Visit: Payer: PPO | Admitting: Urology

## 2021-12-16 VITALS — BP 141/82 | HR 62 | Ht 67.99 in | Wt 189.0 lb

## 2021-12-16 DIAGNOSIS — N5231 Erectile dysfunction following radical prostatectomy: Secondary | ICD-10-CM | POA: Diagnosis not present

## 2021-12-16 DIAGNOSIS — Z8546 Personal history of malignant neoplasm of prostate: Secondary | ICD-10-CM

## 2021-12-16 DIAGNOSIS — C61 Malignant neoplasm of prostate: Secondary | ICD-10-CM

## 2021-12-16 NOTE — Progress Notes (Signed)
12/16/2021 8:57 AM   Benjamin Mills 1946-04-07 [id number]  Referring provider: McLean-Scocuzza, Nino Glow, MD [place],  [place] [address]  Chief Complaint  Patient presents with   Prostate Cancer    HPI: 76 year old male with personal history of prostate cancer, kidney stones, and microscopic hematuria who presents today for annual follow-up with PSA.  He was diagnosed with prostate cancer and rising PSA in 2007 at [facility] in [place]. He underwent laparoscopic radical prostatectomy. His pathology showed Gleason 3+4, [id number]. He developed evidence of biochemical recurrence with negative staging in 2/207 with a rise in his PSA of 0.2. He underwent salvage radiation with Dr. Baruch Gouty in 4.2017 and started on Lupron x1 year.   PSA remains undetectable as of 12/11/2021.   He has a history of severe ED following prostatectomy.  Last visit he was resuming sildenafil which was prescribed.  He reports that he only used a few times, 60 mg with partial response.  His wife has had some health issues and so has not been particularly interested.  No side effects.   He also has a history of stress urinary incontinence following mid urethral sling placement in 2014.  He has minimal incontinence currently.  He does mention that he leaks urine when he ejaculates.  No kidney stone episodes or hematuria.    PMH: Past Medical History:  Diagnosis Date   Amputation of finger, left    5th finger    Arthritis    hands, back lumbar sacral    Cancer ([place]) 2006   Prostate cancer with resection radical prostate removal. Urologist Dr. Hollice Espy locally; prostate ca dx'ed in 2007 and 2015    Carotid artery stenosis    07/26/16 US carotid b/l 1-39%    Cataract    s/p surgery b/l Dr. [facility] 2018   Depression    Diverticulosis    Erectile dysfunction    Gastric ulcer    GERD (gastroesophageal reflux disease)    occ no meds   Hepatic steatosis    History of  kidney stones    h/o   History of splenectomy    Hyperlipidemia    Spinal stenosis    Spinal stenosis at L4-L5 level    Thoracic aortic atherosclerosis (HCC)    Urinary stress incontinence, male     Surgical History: Past Surgical History:  Procedure Laterality Date   COLONOSCOPY     INCONTINENCE SURGERY     KNEE ARTHROSCOPY N/A 1969   meniscal tear. left    PROSTATECTOMY  2007   removal of prostate   REPAIR OF PERFORATED ULCER     2009   SPLENECTOMY  1953   after trauma, fell out tree as kid   TOTAL KNEE ARTHROPLASTY Left 04/29/2020   Procedure: TOTAL KNEE ARTHROPLASTY;  Surgeon: Corky Mull, MD;  Location: [facility] ORS;  Service: Orthopedics;  Laterality: Left;    Home Medications:  Allergies as of 12/16/2021   No Known Allergies      Medication List        Accurate as of December 16, 2021  8:57 AM. If you have any questions, ask your nurse or doctor.          DULoxetine 60 MG capsule Commonly known as: CYMBALTA Take 1 capsule (60 mg total) by mouth daily.   multivitamin capsule Take 1 capsule by mouth daily.   omeprazole 40 MG capsule Commonly known as: PRILOSEC Take 1 capsule (40 mg total) by  mouth daily. 30 minutes before food   sildenafil 20 MG tablet Commonly known as: REVATIO 3-5 tablets as needed 1 hour prior to intercourse   triamcinolone ointment 0.1 % Commonly known as: KENALOG Apply 1 application topically 2 (two) times daily. Rash to leg What changed:  when to take this reasons to take this   valACYclovir 1000 MG tablet Commonly known as: VALTREX Take 1 tablet (1,000 mg total) by mouth 2 (two) times daily. X 3-7 days outbreak with food        Allergies: No Known Allergies  Family History: Family History  Problem Relation Age of Onset   Dementia Mother    Glaucoma Mother    Alcohol abuse Father    Glaucoma Sister    Heart disease Brother        heart valve issue   Multiple sclerosis Brother    Kidney disease Neg Hx     Prostate cancer Neg Hx     Social History:  reports that he quit smoking about 43 years ago. His smoking use included cigarettes. He has a 7.00 pack-year smoking history. He has never used smokeless tobacco. He reports current alcohol use. He reports that he does not use drugs.   Physical Exam: BP (!) 141/82   Pulse 62   Ht 5' 7.99" (1.727 m)   Wt 189 lb (85.7 kg)   BMI 28.75 kg/m   Constitutional:  Alert and oriented, No acute distress. HEENT: [place] AT, moist mucus membranes.  Trachea midline, no masses. Cardiovascular: No clubbing, cyanosis, or edema. Skin: No rashes, bruises or suspicious lesions. Neurologic: Grossly intact, no focal deficits, moving all 4 extremities. Psychiatric: Normal mood and affect.  Laboratory Data: Lab Results  Component Value Date   WBC 8.1 02/17/2021   HGB 13.6 02/17/2021   HCT 41.7 02/17/2021   MCV 94.1 02/17/2021   PLT 302.0 02/17/2021    Lab Results  Component Value Date   CREATININE 0.74 02/17/2021    Lab Results  Component Value Date   HGBA1C 6.2 11/13/2020    Urinalysis UA from 11/22 reviewed, negative   Assessment & Plan:    1. Prostate cancer ([place]) NED, PSA remains undetectable, will continue to check annually - PSA; Future  2. Erectile dysfunction after radical prostatectomy Continue sildenafil as needed   Return in about 1 year (around 12/17/2022) for 1 year follow up with PSA prior.  Hollice Espy, MD  [facility] [address], [place], [place] [address] [phone]

## 2021-12-25 ENCOUNTER — Ambulatory Visit (INDEPENDENT_AMBULATORY_CARE_PROVIDER_SITE_OTHER): Payer: PPO | Admitting: Internal Medicine

## 2021-12-25 ENCOUNTER — Encounter: Payer: Self-pay | Admitting: Internal Medicine

## 2021-12-25 VITALS — BP 138/82 | HR 65 | Temp 97.5°F | Ht 67.99 in | Wt 202.8 lb

## 2021-12-25 DIAGNOSIS — R7303 Prediabetes: Secondary | ICD-10-CM

## 2021-12-25 DIAGNOSIS — R5383 Other fatigue: Secondary | ICD-10-CM | POA: Diagnosis not present

## 2021-12-25 DIAGNOSIS — F4323 Adjustment disorder with mixed anxiety and depressed mood: Secondary | ICD-10-CM

## 2021-12-25 DIAGNOSIS — Z0000 Encounter for general adult medical examination without abnormal findings: Secondary | ICD-10-CM | POA: Diagnosis not present

## 2021-12-25 DIAGNOSIS — G4700 Insomnia, unspecified: Secondary | ICD-10-CM

## 2021-12-25 DIAGNOSIS — E785 Hyperlipidemia, unspecified: Secondary | ICD-10-CM

## 2021-12-25 DIAGNOSIS — K253 Acute gastric ulcer without hemorrhage or perforation: Secondary | ICD-10-CM | POA: Diagnosis not present

## 2021-12-25 DIAGNOSIS — B009 Herpesviral infection, unspecified: Secondary | ICD-10-CM | POA: Diagnosis not present

## 2021-12-25 DIAGNOSIS — Z1329 Encounter for screening for other suspected endocrine disorder: Secondary | ICD-10-CM | POA: Diagnosis not present

## 2021-12-25 LAB — CBC WITH DIFFERENTIAL/PLATELET
Basophils Absolute: 0 10*3/uL (ref 0.0–0.1)
Basophils Relative: 0.5 % (ref 0.0–3.0)
Eosinophils Absolute: 0.2 10*3/uL (ref 0.0–0.7)
Eosinophils Relative: 3.6 % (ref 0.0–5.0)
HCT: 42.2 % (ref 39.0–52.0)
Hemoglobin: 14 g/dL (ref 13.0–17.0)
Lymphocytes Relative: 24.3 % (ref 12.0–46.0)
Lymphs Abs: 1.4 10*3/uL (ref 0.7–4.0)
MCHC: 33.2 g/dL (ref 30.0–36.0)
MCV: 97.4 fl (ref 78.0–100.0)
Monocytes Absolute: 0.6 10*3/uL (ref 0.1–1.0)
Monocytes Relative: 11.1 % (ref 3.0–12.0)
Neutro Abs: 3.5 10*3/uL (ref 1.4–7.7)
Neutrophils Relative %: 60.5 % (ref 43.0–77.0)
Platelets: 214 10*3/uL (ref 150.0–400.0)
RBC: 4.33 Mil/uL (ref 4.22–5.81)
RDW: 14.3 % (ref 11.5–15.5)
WBC: 5.8 10*3/uL (ref 4.0–10.5)

## 2021-12-25 LAB — LIPID PANEL
Cholesterol: 242 mg/dL — ABNORMAL HIGH (ref 0–200)
HDL: 68.4 mg/dL (ref >39.00)
LDL Cholesterol: 154 mg/dL — ABNORMAL HIGH (ref 0–99)
NonHDL: 173.48
Total CHOL/HDL Ratio: 4
Triglycerides: 97 mg/dL (ref 0.0–149.0)
VLDL: 19.4 mg/dL (ref 0.0–40.0)

## 2021-12-25 LAB — COMPREHENSIVE METABOLIC PANEL
ALT: 17 U/L (ref 0–53)
AST: 21 U/L (ref 0–37)
Albumin: 4 g/dL (ref 3.5–5.2)
Alkaline Phosphatase: 78 U/L (ref 39–117)
BUN: 17 mg/dL (ref 6–23)
CO2: 28 mEq/L (ref 19–32)
Calcium: 9.6 mg/dL (ref 8.4–10.5)
Chloride: 103 mEq/L (ref 96–112)
Creatinine, Ser: 0.82 mg/dL (ref 0.40–1.50)
GFR: 85.23 mL/min (ref >60.00)
Glucose, Bld: 91 mg/dL (ref 70–99)
Potassium: 4.7 mEq/L (ref 3.5–5.1)
Sodium: 138 mEq/L (ref 135–145)
Total Bilirubin: 0.8 mg/dL (ref 0.2–1.2)
Total Protein: 6.5 g/dL (ref 6.0–8.3)

## 2021-12-25 LAB — TSH: TSH: 0.41 u[IU]/mL (ref 0.35–5.50)

## 2021-12-25 LAB — HEMOGLOBIN A1C: Hgb A1c MFr Bld: 5.8 % (ref 4.6–6.5)

## 2021-12-25 MED ORDER — OMEPRAZOLE 40 MG PO CPDR: 40.0000 mg | DELAYED_RELEASE_CAPSULE | Freq: Every day | ORAL | 3 refills | Status: DC

## 2021-12-25 MED ORDER — DULOXETINE HCL 60 MG PO CPEP: 60.0000 mg | ORAL_CAPSULE | Freq: Every day | ORAL | 3 refills | Status: DC

## 2021-12-25 MED ORDER — VALACYCLOVIR HCL 1 G PO TABS: 1000.0000 mg | ORAL_TABLET | Freq: Two times a day (BID) | ORAL | 3 refills | Status: DC

## 2021-12-25 NOTE — Progress Notes (Signed)
Chief Complaint  Patient presents with   Follow-up   Annual Exam   Annual  1. Prediabetes hld will check labs today  2. S/p shoulder surgery out of work since 04/2021 for workers comp and having trouble sleeping due to this should is better not needing mobic for shoulder pain  Tried zyquill otc w/o relief of sleep sxs     Review of Systems  Constitutional:  Negative for weight loss.  HENT:  Negative for hearing loss.   Eyes:  Negative for blurred vision.  Respiratory:  Negative for shortness of breath.   Cardiovascular:  Negative for chest pain.  Gastrointestinal:  Negative for abdominal pain and blood in stool.  Musculoskeletal:  Negative for back pain.  Skin:  Negative for rash.  Neurological:  Negative for headaches.  Psychiatric/Behavioral:  Negative for depression. The patient has insomnia.    Past Medical History:  Diagnosis Date   Amputation of finger, left    5th finger    Arthritis    hands, back lumbar sacral    Cancer ([place]) 2006   Prostate cancer with resection radical prostate removal. Urologist Dr. Hollice Espy locally; prostate ca dx'ed in 2007 and 2015    Carotid artery stenosis    07/26/16 US carotid b/l 1-39%    Cataract    s/p surgery b/l Dr. [facility] 2018   Depression    Diverticulosis    Erectile dysfunction    Gastric ulcer    GERD (gastroesophageal reflux disease)    occ no meds   Hepatic steatosis    History of kidney stones    h/o   History of splenectomy    Hyperlipidemia    Spinal stenosis    Spinal stenosis at L4-L5 level    Thoracic aortic atherosclerosis ([place])    Urinary stress incontinence, male    Past Surgical History:  Procedure Laterality Date   COLONOSCOPY     INCONTINENCE SURGERY     KNEE ARTHROSCOPY N/A 1969   meniscal tear. left    PROSTATECTOMY  2007   removal of prostate   REPAIR OF PERFORATED ULCER     2009   SPLENECTOMY  1953   after trauma, fell out tree as kid   TOTAL KNEE ARTHROPLASTY Left  04/29/2020   Procedure: TOTAL KNEE ARTHROPLASTY;  Surgeon: Corky Mull, MD;  Location: [facility] ORS;  Service: Orthopedics;  Laterality: Left;   Family History  Problem Relation Age of Onset   Dementia Mother    Glaucoma Mother    Alcohol abuse Father    Glaucoma Sister    Heart disease Brother        heart valve issue   Multiple sclerosis Brother    Kidney disease Neg Hx    Prostate cancer Neg Hx    Social History   Socioeconomic History   Marital status: Married    Spouse name: Not on file   Number of children: Not on file   Years of education: Not on file   Highest education level: Not on file  Occupational History   Not on file  Tobacco Use   Smoking status: Former    Packs/day: 1.00    Years: 7.00    Total pack years: 7.00    Types: Cigarettes    Quit date: 06/05/1978    Years since quitting: 43.5   Smokeless tobacco: Never   Tobacco comments:    quit in 1980s duration 6-7 YRS 1 ppd no FH lung cancer  Vaping Use   Vaping Use: Never used  Substance and Sexual Activity   Alcohol use: Yes    Alcohol/week: 0.0 standard drinks of alcohol    Comment: [place] [employer]   Drug use: No   Sexual activity: Not Currently  Other Topics Concern   Not on file  Social History Narrative   Born in [place].   Lived in [place].    Moved to [facility], brother lives in [place] another [place]       Has 2 brothers and 1 sister.      Lives with wife in [place]. Has 3 daughters.      Work - retired, Surveyor, quantity      Diet - regular      Exercise - walks occasionally   Social Determinants of Health   Financial Resource Strain: Low Risk  (11/24/2021)   Overall Financial Resource Strain (CARDIA)    Difficulty of Paying Living Expenses: Not hard at all  Food Insecurity: No Food Insecurity (11/24/2021)   Hunger Vital Sign    Worried About Running Out of Food in the Last Year: Never true    Ran Out of Food in the Last Year: Never true  Transportation Needs: No Transportation Needs  (11/24/2021)   PRAPARE - Hydrologist (Medical): No    Lack of Transportation (Non-Medical): No  Physical Activity: Insufficiently Active (11/24/2021)   Exercise Vital Sign    Days of Exercise per Week: 4 days    Minutes of Exercise per Session: 30 min  Stress: No Stress Concern Present (11/24/2021)   [place]    Feeling of Stress : Not at all  Social Connections: Unknown (11/24/2021)   Social Connection and Isolation Panel [NHANES]    Frequency of Communication with Friends and Family: Not on file    Frequency of Social Gatherings with Friends and Family: Not on file    Attends Religious Services: Not on file    Active Member of Clubs or Organizations: Not on file    Attends Archivist Meetings: Not on file    Marital Status: Married  Intimate Partner Violence: Not At Risk (11/24/2021)   Humiliation, Afraid, Rape, and Kick questionnaire    Fear of Current or Ex-Partner: No    Emotionally Abused: No    Physically Abused: No    Sexually Abused: No   Current Meds  Medication Sig   Multiple Vitamin (MULTIVITAMIN) capsule Take 1 capsule by mouth daily.   sildenafil (REVATIO) 20 MG tablet 3-5 tablets as needed 1 hour prior to intercourse   triamcinolone ointment (KENALOG) 0.1 % Apply 1 application topically 2 (two) times daily. Rash to leg (Patient taking differently: Apply 1 application  topically 2 (two) times daily as needed. Rash to leg)   [DISCONTINUED] DULoxetine (CYMBALTA) 60 MG capsule Take 1 capsule (60 mg total) by mouth daily.   [DISCONTINUED] omeprazole (PRILOSEC) 40 MG capsule Take 1 capsule (40 mg total) by mouth daily. 30 minutes before food   [DISCONTINUED] valACYclovir (VALTREX) 1000 MG tablet Take 1 tablet (1,000 mg total) by mouth 2 (two) times daily. X 3-7 days outbreak with food   No Known Allergies Recent Results (from the past 2160 hour(s))  PSA     Status: None    Collection Time: 12/11/21  4:04 PM  Result Value Ref Range   Prostate Specific Ag, Serum <0.1 0.0 - 4.0 ng/mL    Comment: Roche ECLIA methodology.  According to the American Urological Association, Serum PSA should decrease and remain at undetectable levels after radical prostatectomy. The AUA defines biochemical recurrence as an initial PSA value 0.2 ng/mL or greater followed by a subsequent confirmatory PSA value 0.2 ng/mL or greater. Values obtained with different assay methods or kits cannot be used interchangeably. Results cannot be interpreted as absolute evidence of the presence or absence of malignant disease.    Objective  Body mass index is 30.84 kg/m. Wt Readings from Last 3 Encounters:  12/25/21 202 lb 12.8 oz (92 kg)  12/16/21 189 lb (85.7 kg)  11/24/21 189 lb (85.7 kg)   Temp Readings from Last 3 Encounters:  12/25/21 (!) 97.5 F (36.4 C) (Oral)  02/17/21 (!) 96.3 F (35.7 C)  10/17/20 98 F (36.7 C) (Oral)   BP Readings from Last 3 Encounters:  12/25/21 138/82  12/16/21 (!) 141/82  02/17/21 128/82   Pulse Readings from Last 3 Encounters:  12/25/21 65  12/16/21 62  02/17/21 84    Physical Exam Vitals and nursing note reviewed.  Constitutional:      Appearance: Normal appearance. He is well-developed and well-groomed.  HENT:     Head: Normocephalic and atraumatic.  Eyes:     Conjunctiva/sclera: Conjunctivae normal.     Pupils: Pupils are equal, round, and reactive to light.  Cardiovascular:     Rate and Rhythm: Normal rate and regular rhythm.     Heart sounds: Normal heart sounds.  Pulmonary:     Effort: Pulmonary effort is normal. No respiratory distress.     Breath sounds: Normal breath sounds.  Abdominal:     Tenderness: There is no abdominal tenderness.  Skin:    General: Skin is warm and moist.  Neurological:     General: No focal deficit present.     Mental Status: He is alert and oriented to person, place, and time. Mental status is  at baseline.     Sensory: Sensation is intact.     Motor: Motor function is intact.     Coordination: Coordination is intact.     Gait: Gait is intact. Gait normal.  Psychiatric:        Attention and Perception: Attention and perception normal.        Mood and Affect: Mood and affect normal.        Speech: Speech normal.        Behavior: Behavior normal. Behavior is cooperative.        Thought Content: Thought content normal.        Cognition and Memory: Cognition and memory normal.        Judgment: Judgment normal.     Assessment  Plan  Annual physical exam - Plan: Comprehensive metabolic panel, Lipid panel, CBC w/Diff See below   Acute gastric ulcer without hemorrhage or perforation - Plan: omeprazole (PRILOSEC) 40 MG capsule  Hyperlipidemia, unspecified hyperlipidemia type - Plan: Lipid panel  Insomnia We can try Lunesta, restoril or Trazodone for sleep let me know declines for now    HM Had flu shot due Consider prevnar 20 Tdap 2015 Had prevnar and pna 23  shingrix Rx has not had yet given tx today   covid 2/2 pfizer sch 04/10/20 3rd dose call back with date   06/18/21 kc GI polyp f/u in 5 years    Fu urology. Rad onc appts h/o prostate cancer       Ref. Range 12/11/21  Prostate Specific Ag, Serum Latest Ref Range: 0.0 -  4.0 ng/mL <0.1   f/u urology   dermatology established with Roe Coombs seen 2020  seen end 06/2019 or 07/2019 doing well no tx or bx f/u 1 year   Hep C neg 04/08/17   Former smoker quit in 1980s smoked x 6-7 years no FH lung cancer smoked 1 ppd   Rec healthy diet and exercise Provider: Dr. Olivia Mackie McLean-Scocuzza-Internal Medicine

## 2021-12-25 NOTE — Patient Instructions (Addendum)
Covid shot consider  Flu shot consider  Consider prevnar 20   Dr. Glean Hess, restoril or Trazodone for sleep let me know   Insomnia Insomnia is a sleep disorder that makes it difficult to fall asleep or stay asleep. Insomnia can cause fatigue, low energy, difficulty concentrating, mood swings, and poor performance at work or school. There are three different ways to classify insomnia: Difficulty falling asleep. Difficulty staying asleep. Waking up too early in the morning. Any type of insomnia can be long-term (chronic) or short-term (acute). Both are common. Short-term insomnia usually lasts for 3 months or less. Chronic insomnia occurs at least three times a week for longer than 3 months. What are the causes? Insomnia may be caused by another condition, situation, or substance, such as: Having certain mental health conditions, such as anxiety and depression. Using caffeine, alcohol, tobacco, or drugs. Having gastrointestinal conditions, such as gastroesophageal reflux disease (GERD). Having certain medical conditions. These include: Asthma. Alzheimer's disease. Stroke. Chronic pain. An overactive thyroid gland (hyperthyroidism). Other sleep disorders, such as restless legs syndrome and sleep apnea. Menopause. Sometimes, the cause of insomnia may not be known. What increases the risk? Risk factors for insomnia include: Gender. Females are affected more often than males. Age. Insomnia is more common as people get older. Stress and certain medical and mental health conditions. Lack of exercise. Having an irregular work schedule. This may include working night shifts and traveling between different time zones. What are the signs or symptoms? If you have insomnia, the main symptom is having trouble falling asleep or having trouble staying asleep. This may lead to other symptoms, such as: Feeling tired or having low energy. Feeling nervous about going to sleep. Not feeling  rested in the morning. Having trouble concentrating. Feeling irritable, anxious, or depressed. How is this diagnosed? This condition may be diagnosed based on: Your symptoms and medical history. Your health care provider may ask about: Your sleep habits. Any medical conditions you have. Your mental health. A physical exam. How is this treated? Treatment for insomnia depends on the cause. Treatment may focus on treating an underlying condition that is causing the insomnia. Treatment may also include: Medicines to help you sleep. Counseling or therapy. Lifestyle adjustments to help you sleep better. Follow these instructions at home: Eating and drinking  Limit or avoid alcohol, caffeinated beverages, and products that contain nicotine and tobacco, especially close to bedtime. These can disrupt your sleep. Do not eat a large meal or eat spicy foods right before bedtime. This can lead to digestive discomfort that can make it hard for you to sleep. Sleep habits  Keep a sleep diary to help you and your health care provider figure out what could be causing your insomnia. Write down: When you sleep. When you wake up during the night. How well you sleep and how rested you feel the next day. Any side effects of medicines you are taking. What you eat and drink. Make your bedroom a dark, comfortable place where it is easy to fall asleep. Put up shades or blackout curtains to block light from outside. Use a white noise machine to block noise. Keep the temperature cool. Limit screen use before bedtime. This includes: Not watching TV. Not using your smartphone, tablet, or computer. Stick to a routine that includes going to bed and waking up at the same times every day and night. This can help you fall asleep faster. Consider making a quiet activity, such as reading, part of  your nighttime routine. Try to avoid taking naps during the day so that you sleep better at night. Get out of bed if you  are still awake after 15 minutes of trying to sleep. Keep the lights down, but try reading or doing a quiet activity. When you feel sleepy, go back to bed. General instructions Take over-the-counter and prescription medicines only as told by your health care provider. Exercise regularly as told by your health care provider. However, avoid exercising in the hours right before bedtime. Use relaxation techniques to manage stress. Ask your health care provider to suggest some techniques that may work well for you. These may include: Breathing exercises. Routines to release muscle tension. Visualizing peaceful scenes. Make sure that you drive carefully. Do not drive if you feel very sleepy. Keep all follow-up visits. This is important. Contact a health care provider if: You are tired throughout the day. You have trouble in your daily routine due to sleepiness. You continue to have sleep problems, or your sleep problems get worse. Get help right away if: You have thoughts about hurting yourself or someone else. Get help right away if you feel like you may hurt yourself or others, or have thoughts about taking your own life. Go to your nearest emergency room or: Call 911. Call the [facility] at [phone] or 988. This is open 24 hours a day. Text the Crisis Text Line at 2145048136. Summary Insomnia is a sleep disorder that makes it difficult to fall asleep or stay asleep. Insomnia can be long-term (chronic) or short-term (acute). Treatment for insomnia depends on the cause. Treatment may focus on treating an underlying condition that is causing the insomnia. Keep a sleep diary to help you and your health care provider figure out what could be causing your insomnia. This information is not intended to replace advice given to you by your health care provider. Make sure you discuss any questions you have with your health care provider. Document Revised: 03/09/2021 Document  Reviewed: 03/09/2021 Elsevier Patient Education  [place].   Pneumococcal Conjugate Vaccine (Prevnar 20) Suspension for Injection What is this medication? PNEUMOCOCCAL VACCINE (NEU mo KOK al vak SEEN) is a vaccine. It prevents pneumococcus bacterial infections. These bacteria can cause serious infections like pneumonia, meningitis, and blood infections. This vaccine will not treat an infection and will not cause infection. This vaccine is recommended for adults 18 years and older. This medicine may be used for other purposes; ask your health care provider or pharmacist if you have questions. COMMON BRAND NAME(S): Prevnar 20 What should I tell my care team before I take this medication? They need to know if you have any of these conditions: bleeding disorder fever immune system problems an unusual or allergic reaction to pneumococcal vaccine, diphtheria toxoid, other vaccines, other medicines, foods, dyes, or preservatives pregnant or trying to get pregnant breast-feeding How should I use this medication? This vaccine is injected into a muscle. It is given by a health care provider. A copy of Vaccine Information Statements will be given before each vaccination. Be sure to read this information carefully each time. This sheet may change often. Talk to your health care provider about the use of this medicine in children. Special care may be needed. Overdosage: If you think you have taken too much of this medicine contact a poison control center or emergency room at once. NOTE: This medicine is only for you. Do not share this medicine with others. What if I  miss a dose? This does not apply. This medicine is not for regular use. What may interact with this medication? medicines for cancer chemotherapy medicines that suppress your immune function steroid medicines like prednisone or cortisone This list may not describe all possible interactions. Give your health care provider a list  of all the medicines, herbs, non-prescription drugs, or dietary supplements you use. Also tell them if you smoke, drink alcohol, or use illegal drugs. Some items may interact with your medicine. What should I watch for while using this medication? Mild fever and pain should go away in 3 days or less. Report any unusual symptoms to your health care provider. What side effects may I notice from receiving this medication? Side effects that you should report to your doctor or health care professional as soon as possible: allergic reactions (skin rash, itching or hives; swelling of the face, lips, or tongue) confusion fast, irregular heartbeat fever over 102 degrees F muscle weakness seizures trouble breathing unusual bruising or bleeding Side effects that usually do not require medical attention (report to your doctor or health care professional if they continue or are bothersome): fever of 102 degrees F or less headache joint pain muscle cramps, pain pain, tender at site where injected This list may not describe all possible side effects. Call your doctor for medical advice about side effects. You may report side effects to FDA at 1-800-FDA-1088. Where should I keep my medication? This vaccine is only given by a health care provider. It will not be stored at home. NOTE: This sheet is a summary. It may not cover all possible information. If you have questions about this medicine, talk to your doctor, pharmacist, or health care provider.  2023 Elsevier/Gold Standard (2019-11-30 00:00:00)

## 2021-12-28 ENCOUNTER — Telehealth: Payer: Self-pay

## 2021-12-28 NOTE — Telephone Encounter (Signed)
Lvm for pt to return call in regards to lab results.  Per Dr.Tracy: Cholesterol elevated  -is he agreeable to try low dose statin cholesterol medication ?  Liver kidneys normal  Blood counts normal  Thyroid lab normal  A1c improved still prediabetic 5.8 if were 5.6 then no prediabetes

## 2022-01-28 ENCOUNTER — Encounter: Payer: Self-pay | Admitting: Family Medicine

## 2022-01-28 ENCOUNTER — Ambulatory Visit (INDEPENDENT_AMBULATORY_CARE_PROVIDER_SITE_OTHER): Payer: PPO | Admitting: Family Medicine

## 2022-01-28 VITALS — BP 132/80 | HR 95 | Temp 98.2°F | Ht 67.99 in | Wt 191.2 lb

## 2022-01-28 DIAGNOSIS — Z23 Encounter for immunization: Secondary | ICD-10-CM | POA: Diagnosis not present

## 2022-01-28 DIAGNOSIS — I700 Atherosclerosis of aorta: Secondary | ICD-10-CM

## 2022-01-28 DIAGNOSIS — F4323 Adjustment disorder with mixed anxiety and depressed mood: Secondary | ICD-10-CM

## 2022-01-28 MED ORDER — RAMELTEON 8 MG PO TABS: 8.0000 mg | ORAL_TABLET | Freq: Every day | ORAL | 0 refills | Status: DC

## 2022-01-28 MED ORDER — DULOXETINE HCL 30 MG PO CPEP: 30.0000 mg | ORAL_CAPSULE | Freq: Every day | ORAL | 0 refills | Status: DC

## 2022-01-28 NOTE — Patient Instructions (Addendum)
It was a pleasure meeting you today. Thank you for allowing me to take part in your health care.  Our goals for today as we discussed include:  For your mood and sleep Increase Cymbalta to 90 mg daily.  Take one 60 mg tablet and one 30 mg tablet in the morning. Start Ramelteon 8 mg at night for sleep. Take 30 mins before bedtime  Recommend to follow up with scheduling an appointment with therapist as soon as possible  For Kim [phone] Address: [address], [place] [address] Hours: Open 24/7, No appointment required.    You received the High dose Flu vaccine and the Pneumonia 20 vaccine today  Recommend Shingles vaccine.  This is a 2 dose series and can be given at your local pharmacy.  Please talk to your pharmacist about this.   Recommend RSV vaccine  Please follow-up with PCP in 1 week  If you have any questions or concerns, please do not hesitate to call the office at [phone].  I look forward to our next visit and until then take care and stay safe.  Regards,   Carollee Leitz, MD   [facility]

## 2022-01-28 NOTE — Progress Notes (Addendum)
    SUBJECTIVE:   CHIEF COMPLAINT / HPI: increased anxiety  Patient reports anxiety and increase in stress x 1 year but progressively worsening over the past 2-3 weeks.  All started with an injury at work.  Now going through courts. Almost settled.  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  He is having difficulty sleeping (3-4 hrs night),  loss of appetite, reports 10 lbs weight loss in 4 days,  loss of interest in activities, reports more frustration and anger and easily has outbursts.  Has had thoughts of wondering if he would be better of not living but reports no plan to self harm and reports that he would never do that because his wife depends on him.  Has no  plan of action.  Does have firearms at home.  Locked up.  Has not been used in years.  Has not seen a therapist by himself but has gone to a therapist with his wife.  Would really like to be able to get some sleep.  Currently on Cymbalta but did not think he was on this for depression.  Took 2 Delta 9 gummies without much relief.    PERTINENT  PMH / PSH:  Depression/ Anxiety Left bicep tendon rupture Splenectomy   OBJECTIVE:   BP 132/80 (BP Location: Left Arm, Patient Position: Sitting, Cuff Size: Normal)   Pulse 95   Temp 98.2 F (36.8 C) (Oral)   Ht 5' 7.99" (1.727 m)   Wt 191 lb 3.2 oz (86.7 kg)   SpO2 99%   BMI 29.08 kg/m    General: Alert and normal appearance.  Does not appear emaciated.  Cardio: Normal S1 and S2, RRR, no r/m/g Pulm: CTAB, normal work of breathing Psych: Depressed mood, tearful, speech normal, makes good eye contact, answers questions appropriately.  No auditory, visual or tactile hallucinations.     ASSESSMENT/PLAN:   Adjustment disorder with mixed anxiety and depressed mood Chronic.  Increasing stress with settlement and court.  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  Increased anxiety about finances and ability to continue to provide.  Currently on Duloxetine 60 mg daily. PHQ, 24 and GAD 20 today.  Unintentional weight loss likely secondary to increased stress and anxiety.  Recent Colonoscopy, sessile polyp, negative pathology. -Increase Duloxetine to  90 mg daily -Start Remeron 8 mg at night  -Recommend therapy for stress management -Mental health resources provided -Follow up appointment scheduled in 1 week -Strict return precautions provided   HCM High dose Flu and PCV 20 vaccine today Recommend Shingles vaccine Recent Colonoscopy 06/18/2021, sessile polyp, negative pathology.  Recommended 5 yr follow up.  Due 06/2026.    PDMP Reviewed  Carollee Leitz, MD

## 2022-02-03 DIAGNOSIS — F4320 Adjustment disorder, unspecified: Secondary | ICD-10-CM | POA: Diagnosis not present

## 2022-02-04 ENCOUNTER — Ambulatory Visit (INDEPENDENT_AMBULATORY_CARE_PROVIDER_SITE_OTHER): Payer: PPO | Admitting: Family Medicine

## 2022-02-04 ENCOUNTER — Encounter: Payer: Self-pay | Admitting: Family Medicine

## 2022-02-04 VITALS — BP 115/70 | HR 75 | Temp 97.6°F | Ht 68.0 in | Wt 189.0 lb

## 2022-02-04 DIAGNOSIS — R251 Tremor, unspecified: Secondary | ICD-10-CM

## 2022-02-04 DIAGNOSIS — I6523 Occlusion and stenosis of bilateral carotid arteries: Secondary | ICD-10-CM

## 2022-02-04 DIAGNOSIS — F4323 Adjustment disorder with mixed anxiety and depressed mood: Secondary | ICD-10-CM | POA: Diagnosis not present

## 2022-02-04 DIAGNOSIS — I700 Atherosclerosis of aorta: Secondary | ICD-10-CM | POA: Diagnosis not present

## 2022-02-04 NOTE — Progress Notes (Addendum)
SUBJECTIVE:   CHIEF COMPLAINT / HPI: follow up mood   Presents for follow up for increase anxiety and depressed mood. Seen in clinic on 10/19 and Cymbalta was increased to 90 mg daily with addition of Ramelteon 8 mg at night.  Since then patient reports improvement in symptoms.  Has been able to get more sleep, appetite is improving a little, not as tearful and feels like anxiety has slightly diminished,  Reports not back to baseline but significantly better that last week.  Denies any SI/HI.  Has seen therapist with wife since last visit but did not address much about his situation.   Bilateral hand tremors History of tremors but has noticed a slight increased since increasing Cymbalta and addition of Ramelteon.  He is always concerns for Parkinsons as brother in law was diagnosed with same.  Tremor worse when writing or intention. No resting tremor. No gait disturbances, falls or shuffeling of feet.  PERTINENT  PMH / PSH:  Depression/Anxiety S/p Splenectomy Bilateral Tremors  OBJECTIVE:   BP 115/70 (BP Location: Left Arm, Patient Position: Sitting, Cuff Size: Normal)   Pulse 75   Temp 97.6 F (36.4 C) (Oral)   Ht '5\' 8"'$  (1.727 m)   Wt 189 lb (85.7 kg)   SpO2 98%   BMI 28.74 kg/m    General: Alert, no acute distress Cardio: Normal S1 and S2, RRR, no r/m/g Pulm: CTAB, normal work of breathing Psych: mood good, smiling today, more engaging in conversation, speech appropriate    02/04/2022   12:09 PM 01/28/2022    9:19 AM 12/25/2021   10:11 AM 11/24/2021    3:51 PM 10/17/2020   11:13 AM  Depression screen PHQ 2/9  Decreased Interest 2 3 0 0 0  Down, Depressed, Hopeless 2 3 0 0 0  PHQ - 2 Score 4 6 0 0 0  Altered sleeping 1 3     Tired, decreased energy 3 3     Change in appetite 1 3     Feeling bad or failure about yourself  2 3     Trouble concentrating 2 3     Moving slowly or fidgety/restless 2 3     Suicidal thoughts 0 0     PHQ-9 Score 15 24     Difficult doing  work/chores Somewhat difficult Very difficult          02/04/2022   12:10 PM 01/28/2022    9:21 AM 07/31/2019    8:28 AM  GAD 7 : Generalized Anxiety Score  Nervous, Anxious, on Edge 3 3 0  Control/stop worrying 3 3 0  Worry too much - different things 3 3 0  Trouble relaxing 2 3 0  Restless 2 2 0  Easily annoyed or irritable 3 3 0  Afraid - awful might happen 3 3 0  Total GAD 7 Score 19 20 0  Anxiety Difficulty Somewhat difficult Very difficult Not difficult at all      ASSESSMENT/PLAN:   Adjustment disorder with mixed anxiety and depressed mood Acute on Chronic.  Improved with increase in medication and addition of Ramelteon.  PHQ9 decreased, 15<24, GAD decreased 19<20.  Denies any SI/HI.  Discussed counseling with a different therapist and patient agreeable. -Refer to psychology for stress management counseling -Continue Cymbalta 90 mg daily -Continue Ramelteon 8 mg at night -Mental health resources provided -Follow up in 1 week -Strict return precautions provided  Atherosclerosis of aorta (HCC) Chronic. Stable  Recent CT abdomen from  02/18/21 showing aortic atherosclerosis Not currently on statin Recent LDL 154 from 09/23 Will discuss with patient at next visit statin therapy.  10 yr ASCVD risk 21.5%.  ASCVD only estimated lifetime risk for patients between 20-59.   Bilateral carotid artery stenosis Carotid  Doppler 04/ 2018, Mild bilateral disease and no need for follow up studies per Dr Fletcher Anon If becomes symptomatic can reimage and follow up with cardiology  Tremor of both hands Likely essential tremor given no resting tremor and worsening with voluntary movement.  No shuffling of gait, falls or resting tremor.  Has been ongoing for years. -Will continue to monitor -Consider discussion of treatment at next visit. -Monitor for any worsening symptoms    Carollee Leitz, MD

## 2022-02-04 NOTE — Patient Instructions (Signed)
It was a pleasure meeting you today. Thank you for allowing me to take part in your health care.  Our goals for today as we discussed include:  For your mood I'm happy to hear that you are feeling better. Continue Duloxetine 90 mg daily Continue Ramelteon 8 mg at night Recommend counseling with own therapist.  For [place] [phone] Address: [place], [place] [address] Hours: Open 24/7, No appointment required.    Please follow-up with PCP on Nov 2 at 1140  If you have any questions or concerns, please do not hesitate to call the office at [phone].  I look forward to our next visit and until then take care and stay safe.  Regards,   Carollee Leitz, MD   [facility]

## 2022-02-06 ENCOUNTER — Encounter: Payer: Self-pay | Admitting: Family Medicine

## 2022-02-06 DIAGNOSIS — R251 Tremor, unspecified: Secondary | ICD-10-CM | POA: Insufficient documentation

## 2022-02-06 DIAGNOSIS — I700 Atherosclerosis of aorta: Secondary | ICD-10-CM | POA: Insufficient documentation

## 2022-02-06 NOTE — Assessment & Plan Note (Signed)
Likely essential tremor given no resting tremor and worsening with voluntary movement.  No shuffling of gait, falls or resting tremor.  Has been ongoing for years. -Will continue to monitor -Consider discussion of treatment at next visit. -Monitor for any worsening symptoms

## 2022-02-06 NOTE — Assessment & Plan Note (Signed)
Acute on Chronic.  Improved with increase in medication and addition of Ramelteon.  PHQ9 decreased, 15<24, GAD decreased 19<20.  Denies any SI/HI.  Discussed counseling with a different therapist and patient agreeable. -Refer to psychology for stress management counseling -Continue Cymbalta 90 mg daily -Continue Ramelteon 8 mg at night -Mental health resources provided -Follow up in 1 week -Strict return precautions provided

## 2022-02-06 NOTE — Assessment & Plan Note (Addendum)
Chronic.  Increasing stress with settlement and court.  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  Increased anxiety about finances and ability to continue to provide.  Currently on Duloxetine 60 mg daily. PHQ, 24 and GAD 20 today. Unintentional weight loss likely secondary to increased stress and anxiety.  Recent Colonoscopy, sessile polyp, negative pathology. -Increase Duloxetine to  90 mg daily -Start Remeron 8 mg at night  -Recommend therapy for stress management -Mental health resources provided -Follow up appointment scheduled in 1 week -Strict return precautions provided

## 2022-02-06 NOTE — Assessment & Plan Note (Addendum)
Chronic. Stable  Recent CT abdomen from 02/18/21 showing aortic atherosclerosis Not currently on statin Recent LDL 154 from 09/23 Will discuss with patient at next visit statin therapy.  10 yr ASCVD risk 21.5%.  ASCVD only estimated lifetime risk for patients between 20-59.

## 2022-02-06 NOTE — Assessment & Plan Note (Signed)
Carotid  Doppler 04/ 2018, Mild bilateral disease and no need for follow up studies per Dr Fletcher Anon If becomes symptomatic can reimage and follow up with cardiology

## 2022-02-11 ENCOUNTER — Encounter: Payer: Self-pay | Admitting: Family Medicine

## 2022-02-11 ENCOUNTER — Ambulatory Visit (INDEPENDENT_AMBULATORY_CARE_PROVIDER_SITE_OTHER): Payer: PPO | Admitting: Family Medicine

## 2022-02-11 VITALS — BP 116/76 | HR 77 | Temp 98.0°F | Ht 68.0 in | Wt 192.6 lb

## 2022-02-11 DIAGNOSIS — G4763 Sleep related bruxism: Secondary | ICD-10-CM | POA: Diagnosis not present

## 2022-02-11 DIAGNOSIS — F4323 Adjustment disorder with mixed anxiety and depressed mood: Secondary | ICD-10-CM

## 2022-02-11 NOTE — Progress Notes (Signed)
SUBJECTIVE:   CHIEF COMPLAINT / HPI: follow up mood  Presents for follow up for anxiety and depressed mood. Initial clinic visit was 10/19 Had first follow up 10/26 in clinic. Today is 3rd follow up and since last visit reports that there has been a significant improvement in mood and anxiety.  Reports that he feels like he almost back to baseline.  His sleep has improved without use of sleeping aids.  His court settlement has gone through so has helped relieve financial burden.  Had seen therapist with wife but would like his own therapist to discuss issues. Denies any SI/HI.  Concerned for teeth grinding at night.  Recently had new dentures about 3 weeks ago.  Reports thinks grinding may have started around this time.  Only happens at night.  Denies any popping, clicking sensation while chewing.  No headaches or visual changes.    PERTINENT  PMH / PSH:  Depression/Anxiety Essential tremor  OBJECTIVE:   BP 116/76 (BP Location: Left Arm, Patient Position: Sitting, Cuff Size: Normal)   Pulse 77   Temp 98 F (36.7 C) (Oral)   Ht '5\' 8"'$  (1.727 m)   Wt 192 lb 9.6 oz (87.4 kg)   SpO2 98%   BMI 29.28 kg/m    General: Alert, no acute distress HEENT: Mild crepitus of TMJ bilaterally, no pain on palpation Cardio: Normal S1 and S2, RRR, no r/m/g Pulm: CTAB, normal work of breathing     02/11/2022   11:53 AM 02/11/2022   11:39 AM 02/04/2022   12:09 PM 01/28/2022    9:19 AM 12/25/2021   10:11 AM  Depression screen PHQ 2/9  Decreased Interest '1 1 2 3 '$ 0  Down, Depressed, Hopeless '1 1 2 3 '$ 0  PHQ - 2 Score '2 2 4 6 '$ 0  Altered sleeping '2  1 3   '$ Tired, decreased energy '1  3 3   '$ Change in appetite '2  1 3   '$ Feeling bad or failure about yourself  '1  2 3   '$ Trouble concentrating 0  2 3   Moving slowly or fidgety/restless '1  2 3   '$ Suicidal thoughts 0  0 0   PHQ-9 Score '9  15 24   '$ Difficult doing work/chores Somewhat difficult  Somewhat difficult Very difficult        02/11/2022   11:54 AM  02/04/2022   12:10 PM 01/28/2022    9:21 AM 07/31/2019    8:28 AM  GAD 7 : Generalized Anxiety Score  Nervous, Anxious, on Edge '1 3 3 '$ 0  Control/stop worrying '1 3 3 '$ 0  Worry too much - different things '1 3 3 '$ 0  Trouble relaxing '1 2 3 '$ 0  Restless '1 2 2 '$ 0  Easily annoyed or irritable '2 3 3 '$ 0  Afraid - awful might happen '2 3 3 '$ 0  Total GAD 7 Score '9 19 20 '$ 0  Anxiety Difficulty Somewhat difficult Somewhat difficult Very difficult Not difficult at all      ASSESSMENT/PLAN:   Adjustment disorder with mixed anxiety and depressed mood Improving.  PHQ9 and GAD7 scores have both decreased with the increase in medication.  Denies any SI/HI -Continue Duloxetine 90 mg daily -Can continue Ramelteon 4-8 mg at night as needed -Refer to psychology for CBT -Follow up in 4 weeks. Sooner if needed  Bruxism, sleep-related Suspect increase in anxiety causing increased teeth grinding.  Mild TMJ -Use heat/ice as needed -Gentle neck exercises to relieve TMJ tension -Can  take Tylenol 325 mg every 6 hours as needed for discomfort -Can take Ibuprofen 200 mg every 8 hours as needed for discomfort -Follow up with Dentist/Denturist  HCM Declined Shingles vaccine  PDMP Reviewed  Carollee Leitz, MD

## 2022-02-11 NOTE — Patient Instructions (Addendum)
It was a pleasure seeing you today. Thank you for allowing me to take part in your health care.  Our goals for today as we discussed include:  I am happy that you are feeling much better today  For your mood Continue Duloxetine 90 mg daily Can use Ramelteon 8 mg at night as needed for sleep Follow up with Therapist as scheduled in Dec  For [place] [phone] Address: [place],  [address] Hours: Open 24/7, No appointment required.    For your grinding of your teeth Follow up with your denturist Use heat/ice as needed Gentle neck exercises to relieve TMJ tension Can take Tylenol 325 mg every 6 hours as needed for discomfort Can take Ibuprofen 200 mg every 8 hours as needed for discomfort    Please follow-up with PCP as scheduled in Dec  If you have any questions or concerns, please do not hesitate to call the office at [phone].  I look forward to our next visit and until then take care and stay safe.  Regards,   Carollee Leitz, MD   [facility]   Temporomandibular Joint Syndrome  Temporomandibular joint syndrome (TMJ syndrome) is a condition that causes pain in the temporomandibular joints. These joints are located near your ears and allow your jaw to open and close. For people with TMJ syndrome, chewing, biting, or other movements of the jaw can be difficult or painful. TMJ syndrome is often mild and goes away within a few weeks. However, sometimes the condition becomes a long-term (chronic) problem. What are the causes? This condition may be caused by: Grinding your teeth or clenching your jaw. Some people do this when they are stressed. Arthritis. An injury to the jaw. A head or neck injury. Teeth or dentures that are not aligned well. In some cases, the cause of TMJ syndrome may not be known. What are the signs or symptoms? The most common symptom of this condition is aching  pain on the side of the head in the area of the TMJ. Other symptoms may include: Pain when moving your jaw, such as when chewing or biting. Not being able to open your jaw all the way. Making a clicking sound when you open your mouth. Headache. Earache. Neck or shoulder pain. How is this diagnosed? This condition may be diagnosed based on: Your symptoms and medical history. A physical exam. Your health care provider may check the range of motion of your jaw. Imaging tests, such as X-rays or an MRI. You may also need to see your dentist, who will check if your teeth and jaw are lined up correctly. How is this treated? TMJ syndrome often goes away on its own. If treatment is needed, it may include: Eating soft foods and applying ice or heat. Medicines to relieve pain or inflammation. Medicines or massage to relax the muscles. A splint, bite plate, or mouthpiece to prevent teeth grinding or jaw clenching. Relaxation techniques or counseling to help reduce stress. A therapy for pain in which an electrical current is applied to the nerves through the skin (transcutaneous electrical nerve stimulation). Acupuncture. This may help to relieve pain. Jaw surgery. This is rarely needed. Follow these instructions at home:  Eating and drinking Eat a soft diet if you are having trouble chewing. Avoid foods that require a lot of chewing. Do not chew gum. General instructions Take over-the-counter and prescription medicines only as told by your health care provider. If  directed, put ice on the painful area. To do this: Put ice in a plastic bag. Place a towel between your skin and the bag. Leave the ice on for 20 minutes, 2-3 times a day. Remove the ice if your skin turns bright red. This is very important. If you cannot feel pain, heat, or cold, you have a greater risk of damage to the area. Apply a warm, wet cloth (warm compress) to the painful area as told. Massage your jaw area and do any jaw  stretching exercises as told by your health care provider. If you were given a splint, bite plate, or mouthpiece, wear it as told by your health care provider. Keep all follow-up visits. This is important. Where to find more information [place]: https://avila-olson.com/ Contact a health care provider if: You have trouble eating. You have new or worsening symptoms. Get help right away if: Your jaw locks. Summary Temporomandibular joint syndrome (TMJ syndrome) is a condition that causes pain in the temporomandibular joints. These joints are located near your ears and allow your jaw to open and close. TMJ syndrome is often mild and goes away within a few weeks. However, sometimes the condition becomes a long-term (chronic) problem. Symptoms include an aching pain on the side of the head in the area of the TMJ, pain when chewing or biting, and being unable to open your jaw all the way. You may also make a clicking sound when you open your mouth. TMJ syndrome often goes away on its own. If treatment is needed, it may include medicines to relieve pain, reduce inflammation, or relax the muscles. A splint, bite plate, or mouthpiece may also be used to prevent teeth grinding or jaw clenching. This information is not intended to replace advice given to you by your health care provider. Make sure you discuss any questions you have with your health care provider. Document Revised: 11/09/2020 Document Reviewed: 11/09/2020 Elsevier Patient Education  [place].

## 2022-02-24 ENCOUNTER — Encounter: Payer: Self-pay | Admitting: Family Medicine

## 2022-02-26 ENCOUNTER — Other Ambulatory Visit: Payer: Self-pay

## 2022-02-26 MED ORDER — DULOXETINE HCL 30 MG PO CPEP: 30.0000 mg | ORAL_CAPSULE | Freq: Every day | ORAL | 0 refills | Status: DC

## 2022-02-27 ENCOUNTER — Encounter: Payer: Self-pay | Admitting: Family Medicine

## 2022-02-27 DIAGNOSIS — G4763 Sleep related bruxism: Secondary | ICD-10-CM | POA: Insufficient documentation

## 2022-02-27 NOTE — Assessment & Plan Note (Signed)
Improving.  PHQ9 and GAD7 scores have both decreased with the increase in medication.  Denies any SI/HI -Continue Duloxetine 90 mg daily -Can continue Ramelteon 4-8 mg at night as needed -Refer to psychology for CBT -Follow up in 4 weeks. Sooner if needed

## 2022-02-27 NOTE — Assessment & Plan Note (Signed)
Suspect increase in anxiety causing increased teeth grinding.  Mild TMJ -Use heat/ice as needed -Gentle neck exercises to relieve TMJ tension -Can take Tylenol 325 mg every 6 hours as needed for discomfort -Can take Ibuprofen 200 mg every 8 hours as needed for discomfort -Follow up with Dentist/Denturist

## 2022-03-10 ENCOUNTER — Ambulatory Visit: Payer: PPO | Admitting: Clinical

## 2022-03-16 ENCOUNTER — Ambulatory Visit: Payer: PPO | Admitting: Clinical

## 2022-03-22 ENCOUNTER — Other Ambulatory Visit: Payer: Self-pay | Admitting: Family Medicine

## 2022-03-23 NOTE — Telephone Encounter (Signed)
Prescription sent to Pharmacy.  

## 2022-04-07 ENCOUNTER — Encounter: Payer: Self-pay | Admitting: Family Medicine

## 2022-04-07 ENCOUNTER — Ambulatory Visit (INDEPENDENT_AMBULATORY_CARE_PROVIDER_SITE_OTHER): Payer: PPO | Admitting: Family Medicine

## 2022-04-07 VITALS — BP 118/80 | HR 67 | Temp 98.0°F | Ht 68.0 in | Wt 192.0 lb

## 2022-04-07 DIAGNOSIS — F4323 Adjustment disorder with mixed anxiety and depressed mood: Secondary | ICD-10-CM

## 2022-04-07 DIAGNOSIS — F39 Unspecified mood [affective] disorder: Secondary | ICD-10-CM | POA: Diagnosis not present

## 2022-04-07 DIAGNOSIS — C61 Malignant neoplasm of prostate: Secondary | ICD-10-CM | POA: Diagnosis not present

## 2022-04-07 DIAGNOSIS — I700 Atherosclerosis of aorta: Secondary | ICD-10-CM | POA: Diagnosis not present

## 2022-04-07 DIAGNOSIS — E785 Hyperlipidemia, unspecified: Secondary | ICD-10-CM

## 2022-04-07 MED ORDER — DULOXETINE HCL 30 MG PO CPEP: 90.0000 mg | ORAL_CAPSULE | Freq: Every day | ORAL | 3 refills | Status: DC

## 2022-04-07 NOTE — Progress Notes (Signed)
   SUBJECTIVE:   Chief Complaint  Patient presents with   Establish Care    Transfer of Care   HPI Patient presents to clinic to transfer care.  No acute concerns today.  Mood disorder Doing well on current dose of duloxetine.  Sleeping better, appetite is increased.  Reports since s completed more financially stable which has been a huge relief.  Has started self therapy and doing well.  Continues to grind teeth at night.  Follow-up with dentistry did not seem to have any concerns with new dentures.  Patient reports he does notice increasing repetitive movements of hands.  Had been without 30 mg of duloxetine for 2 or 3 weeks but since restarting back to 90 mg has noticed a decrease in fidgety movements.  Hyperlipidemia Not currently on statin therapy.  Does not want to start medications at this time.  ED History of prostate CA, status post radical prostatectomy.  Previously taking Revatio and follows with urology.  Reports PSA normal.   PERTINENT PMH / PSH: Mood disorder Hyperlipidemia Prostate CA status post radical prostatectomy   OBJECTIVE:  BP 118/80   Pulse 67   Temp 98 F (36.7 C)   Ht '5\' 8"'$  (1.727 m)   Wt 192 lb (87.1 kg)   SpO2 99%   BMI 29.19 kg/m    Physical Exam Constitutional:      General: He is not in acute distress.    Appearance: He is normal weight. He is not ill-appearing.  HENT:     Head: Normocephalic.  Eyes:     Conjunctiva/sclera: Conjunctivae normal.  Cardiovascular:     Rate and Rhythm: Normal rate and regular rhythm.     Pulses: Normal pulses.  Pulmonary:     Effort: Pulmonary effort is normal.     Breath sounds: Normal breath sounds.  Abdominal:     General: Bowel sounds are normal.     Palpations: Abdomen is soft.  Skin:    General: Skin is warm and dry.  Neurological:     Mental Status: He is alert. Mental status is at baseline.  Psychiatric:        Mood and Affect: Mood normal.        Behavior: Behavior normal.         Thought Content: Thought content normal.        Judgment: Judgment normal.     ASSESSMENT/PLAN:  Mood disorder (HCC) Assessment & Plan: Chronic.  Stable.  Significantly improved with increase in Cymbalta. Denies any SI/HI. Continue CBT Continue Cymbalta 90 mg daily Follow-up as needed.  Orders: -     DULoxetine HCl; Take 3 capsules (90 mg total) by mouth daily.  Dispense: 270 capsule; Refill: 3  Prostate cancer [facility]) Assessment & Plan: Chronic.  Stable.  Doing well.  Status post radical prostatectomy Follows with urology   Atherosclerosis of aorta [facility]) Assessment & Plan: Chronic.  Stable.  Not interested in statin therapy. Continue healthy diet and increased activity   HCM Recommend second dose shingles vaccine.   PDMP reviewed  Return in about 4 weeks (around 05/05/2022) for PCP, involunatry hand movement.  Carollee Leitz, MD

## 2022-04-07 NOTE — Patient Instructions (Addendum)
It was a pleasure meeting you today. Thank you for allowing me to take part in your health care.  Our goals for today as we discussed include:  Continue Cymbalta 90 mg daily.  Take  three 30 mg tablets daily. Continue to monitor for and movements and if not improved then schedule appointment with me in the next 2-3 weeks and will further evaluate.   Recommend Shingles vaccine.  This is a 2 dose series and can be given at your local pharmacy.  Please talk to your pharmacist about this.   If you have any questions or concerns, please do not hesitate to call the office at [phone].  I look forward to our next visit and until then take care and stay safe.  Regards,   Carollee Leitz, MD   [facility]

## 2022-04-19 ENCOUNTER — Encounter: Payer: Self-pay | Admitting: Family Medicine

## 2022-04-19 DIAGNOSIS — C61 Malignant neoplasm of prostate: Secondary | ICD-10-CM | POA: Insufficient documentation

## 2022-04-19 DIAGNOSIS — F39 Unspecified mood [affective] disorder: Secondary | ICD-10-CM | POA: Insufficient documentation

## 2022-04-19 NOTE — Assessment & Plan Note (Signed)
Chronic.  Stable.  Not interested in statin therapy. Continue healthy diet and increased activity 

## 2022-04-19 NOTE — Assessment & Plan Note (Signed)
Chronic.  Stable.  Doing well.  Status post radical prostatectomy Follows with urology

## 2022-04-19 NOTE — Assessment & Plan Note (Signed)
Chronic.  Stable.  Significantly improved with increase in Cymbalta. Denies any SI/HI. Continue CBT Continue Cymbalta 90 mg daily Follow-up as needed.

## 2022-05-25 DIAGNOSIS — M2012 Hallux valgus (acquired), left foot: Secondary | ICD-10-CM | POA: Diagnosis not present

## 2022-05-25 DIAGNOSIS — M2042 Other hammer toe(s) (acquired), left foot: Secondary | ICD-10-CM | POA: Diagnosis not present

## 2022-05-26 ENCOUNTER — Encounter: Payer: Self-pay | Admitting: Family Medicine

## 2022-05-31 ENCOUNTER — Telehealth: Payer: PPO | Admitting: Family Medicine

## 2022-05-31 DIAGNOSIS — F39 Unspecified mood [affective] disorder: Secondary | ICD-10-CM

## 2022-05-31 NOTE — Progress Notes (Signed)
Called the patient and lvm for him to get on caregility.  I called twice and got no answer.  The VM stated for him to call back at the front.  Tyrell Brereton,cma

## 2022-06-01 NOTE — Patient Instructions (Incomplete)
It was a pleasure meeting you today. Thank you for allowing me to take part in your health care.  Our goals for today as we discussed include:  Increase Duloxetine to 60 mg two times a day Start Celexa 10 mg daily  Monitor for any increase in blood pressure or increase in heart rate  Follow up in 2 weeks   If you have any questions or concerns, please do not hesitate to call the office at [phone].  I look forward to our next visit and until then take care and stay safe.  Regards,   Dana Allan, MD   [facility]

## 2022-06-01 NOTE — Progress Notes (Signed)
SUBJECTIVE:   Chief Complaint  Patient presents with   Medical Management of Chronic Issues    Anxiety medicaiton discussion   HPI Patient presents to clinic for discuss of mood  Reports was recently seen by Podiatry and noticed on AVS diagnosis of Adjustment disorder with anxiety and depressed mood.  Feels like this reflects his mood now.  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  Has not followed through with weekly therapy and see therapist only when things are about to explode.  Tried to let adult daughters help but feels guilty.  Has episodes of agitation and increased anxiety.  Compliant with medication ut feels like needs adjusting.  Not open to referral to psychiatry yet.  PERTINENT PMH / PSH: Mood disorder  OBJECTIVE:  BP 120/78   Pulse 77   Temp 97.8 F (36.6 C) (Oral)   Ht '5\' 8"'$  (1.727 m)   Wt 184 lb 12.8 oz (83.8 kg)   SpO2 99%   BMI 28.10 kg/m    Physical Exam Vitals reviewed.  Constitutional:      General: He is not in acute distress.    Appearance: Normal appearance. He is normal weight. He is not ill-appearing, toxic-appearing or diaphoretic.  Eyes:     General:        Right eye: No discharge.        Left eye: No discharge.  Cardiovascular:     Rate and Rhythm: Normal rate.  Pulmonary:     Effort: Pulmonary effort is normal.  Musculoskeletal:        General: Normal range of motion.     Cervical back: Normal range of motion.  Skin:    General: Skin is warm and dry.  Neurological:     Mental Status: He is alert and oriented to person, place, and time. Mental status is at baseline.  Psychiatric:        Attention and Perception: Attention normal.        Mood and Affect: Mood is anxious and depressed.        Behavior: Behavior normal.        Thought Content: Thought content normal.        Cognition and Memory: Cognition and memory normal.        Judgment: Judgment normal.    [place] [facility] Visit from  06/02/2022 in [place] at [facility] Visit from 04/07/2022 in [place] at [facility] Visit from 02/11/2022 in [place] at [facility]  Thoughts that you would be better off dead, or of hurting yourself in some way Not at all Not at all Not at all  PHQ-9 Total Score '7 5 9          '$ 06/02/2022   10:38 AM 04/07/2022    1:03 PM 02/11/2022   11:54 AM 02/04/2022   12:10 PM  GAD 7 : Generalized Anxiety Score  Nervous, Anxious, on Edge '2 1 1 3  '$ Control/stop worrying '1 1 1 3  '$ Worry too much - different things '1 1 1 3  '$ Trouble relaxing '1 1 1 2  '$ Restless 0 '1 1 2  '$ Easily annoyed or irritable '3 2 2 3  '$ Afraid - awful might happen 0 '1 2 3  '$ Total GAD 7 Score '8 8 9 19  '$ Anxiety Difficulty Somewhat difficult Somewhat difficult Somewhat difficult Somewhat difficult      ASSESSMENT/PLAN:  Mood disorder ([place]) Assessment &  Plan: Chronic.  Stable.  Increase in PHQ9, no change in GAD7 Denies any SI/HI. Continue CBT Offered psychiatry referral, patient politely declined Increase Cymbalta 60 mg BID Start Celexa 10 mg daily Follow-up 2 weeks Strict return precautions provided  Orders: -     DULoxetine HCl; Take 1 capsule (60 mg total) by mouth 2 (two) times daily.  Dispense: 180 capsule; Refill: 3 -     Citalopram Hydrobromide; Take 1 tablet (10 mg total) by mouth daily.  Dispense: 90 tablet; Refill: 0  Atherosclerosis of aorta (HCC) Assessment & Plan: Chronic.  Stable.  Not interested in statin therapy. Continue healthy diet and increased activity    PDMP reviewed  Return in about 2 weeks (around 06/16/2022) for PCP.  Carollee Leitz, MD

## 2022-06-02 ENCOUNTER — Ambulatory Visit (INDEPENDENT_AMBULATORY_CARE_PROVIDER_SITE_OTHER): Payer: PPO | Admitting: Family Medicine

## 2022-06-02 ENCOUNTER — Encounter: Payer: Self-pay | Admitting: Family Medicine

## 2022-06-02 VITALS — BP 120/78 | HR 77 | Temp 97.8°F | Ht 68.0 in | Wt 184.8 lb

## 2022-06-02 DIAGNOSIS — F39 Unspecified mood [affective] disorder: Secondary | ICD-10-CM | POA: Diagnosis not present

## 2022-06-02 DIAGNOSIS — I700 Atherosclerosis of aorta: Secondary | ICD-10-CM | POA: Diagnosis not present

## 2022-06-02 MED ORDER — CITALOPRAM HYDROBROMIDE 10 MG PO TABS: 10.0000 mg | ORAL_TABLET | Freq: Every day | ORAL | 0 refills | Status: DC

## 2022-06-02 MED ORDER — DULOXETINE HCL 60 MG PO CPEP: 60.0000 mg | ORAL_CAPSULE | Freq: Two times a day (BID) | ORAL | 3 refills | Status: DC

## 2022-06-02 NOTE — Assessment & Plan Note (Signed)
Chronic.  Stable.  Not interested in statin therapy. Continue healthy diet and increased activity

## 2022-06-02 NOTE — Assessment & Plan Note (Signed)
Chronic.  Stable.  Increase in PHQ9, no change in GAD7 Denies any SI/HI. Continue CBT Offered psychiatry referral, patient politely declined Increase Cymbalta 60 mg BID Start Celexa 10 mg daily Follow-up 2 weeks Strict return precautions provided

## 2022-06-03 ENCOUNTER — Other Ambulatory Visit: Payer: Self-pay | Admitting: Podiatry

## 2022-06-04 ENCOUNTER — Encounter: Payer: Self-pay | Admitting: Family Medicine

## 2022-06-05 ENCOUNTER — Encounter: Payer: Self-pay | Admitting: Family Medicine

## 2022-06-07 ENCOUNTER — Encounter: Payer: Self-pay | Admitting: Family Medicine

## 2022-06-09 ENCOUNTER — Encounter: Payer: Self-pay | Admitting: Podiatry

## 2022-06-09 NOTE — Discharge Instructions (Signed)
[facility]  POST OPERATIVE INSTRUCTIONS FOR DR. FOWLER AND [facility]   Take your medication as prescribed.  Pain medication should be taken only as needed.  Keep the dressing clean, dry and intact.  Keep your foot elevated above the heart level for the first 48 hours.  Walking to the bathroom and brief periods of walking are acceptable, unless we have instructed you to be non-weight bearing.  Always wear your post-op shoe when walking.  Always use your crutches if you are to be non-weight bearing.  Do not take a shower. Baths are permissible as long as the foot is kept out of the water.   Every hour you are awake:  Bend your knee 15 times. Flex foot 15 times Massage calf 15 times  Call [facility] ([phone]) if any of the following problems occur: You develop a temperature or fever. The bandage becomes saturated with blood. Medication does not stop your pain. Injury of the foot occurs. Any symptoms of infection including redness, odor, or red streaks running from wound. 

## 2022-06-15 ENCOUNTER — Telehealth: Payer: Self-pay | Admitting: Family Medicine

## 2022-06-15 ENCOUNTER — Ambulatory Visit (INDEPENDENT_AMBULATORY_CARE_PROVIDER_SITE_OTHER): Payer: PPO | Admitting: Family Medicine

## 2022-06-15 ENCOUNTER — Encounter: Payer: Self-pay | Admitting: Family Medicine

## 2022-06-15 VITALS — BP 120/80 | HR 82 | Temp 98.0°F | Ht 68.0 in | Wt 184.2 lb

## 2022-06-15 DIAGNOSIS — F39 Unspecified mood [affective] disorder: Secondary | ICD-10-CM | POA: Diagnosis not present

## 2022-06-15 DIAGNOSIS — R251 Tremor, unspecified: Secondary | ICD-10-CM | POA: Diagnosis not present

## 2022-06-15 MED ORDER — CITALOPRAM HYDROBROMIDE 10 MG PO TABS: 10.0000 mg | ORAL_TABLET | Freq: Every day | ORAL | 1 refills | Status: DC

## 2022-06-15 MED ORDER — DULOXETINE HCL 60 MG PO CPEP: 60.0000 mg | ORAL_CAPSULE | Freq: Two times a day (BID) | ORAL | 3 refills | Status: DC

## 2022-06-15 NOTE — Telephone Encounter (Signed)
During check out today, pt mentioned that he's having surgery tomorrow, and as per provider request, pt needs to F/U in a week, however, theres nothing available for me to put pt in.Benjamin Mills

## 2022-06-15 NOTE — Patient Instructions (Signed)
It was a pleasure meeting you today. Thank you for allowing me to take part in your health care.  Our goals for today as we discussed include:  Glad you are feeling better  Follow up after March 12 to discuss medication for essential tremor   If you have any questions or concerns, please do not hesitate to call the office at [phone].  I look forward to our next visit and until then take care and stay safe.  Regards,   Carollee Leitz, MD   [facility]

## 2022-06-15 NOTE — Progress Notes (Signed)
   SUBJECTIVE:   Chief Complaint  Patient presents with   Medical Management of Chronic Issues    2 week f/up    HPI Follow up mood disorder. Doing much better since last visit.  He reports feeling less frustrated and agitated.  Denies any SI/HI. Had increased Duloxetine to 60 mg twice a day and added Celexa 10 mg daily.  Tolerating medications.  Has noticed increase in appetite and vivid dreams.  Not disturbing. Would like to continue current dose of Citalopram and Duloxetine as this seems to be working for him now.   Essential Tremor Reports continues to have mild tremors of both hands.  Has made appointment with Neurology.  No resting tremor.  Appears more noticeable when trying to complete tasks, writing, reaching for coffee mug. Now becoming more bothersome daily.  PERTINENT PMH / PSH: Mood disorder Essential tremor  OBJECTIVE:  BP 120/80   Pulse 82   Temp 98 F (36.7 C) (Oral)   Ht '5\' 8"'$  (1.727 m)   Wt 184 lb 3.2 oz (83.6 kg)   SpO2 98%   BMI 28.01 kg/m    Physical Exam Vitals reviewed.  Constitutional:      General: He is not in acute distress.    Appearance: Normal appearance. He is normal weight. He is not ill-appearing, toxic-appearing or diaphoretic.  Eyes:     General:        Right eye: No discharge.        Left eye: No discharge.  Cardiovascular:     Rate and Rhythm: Normal rate.  Pulmonary:     Effort: Pulmonary effort is normal.  Neurological:     Mental Status: He is alert and oriented to person, place, and time. Mental status is at baseline.  Psychiatric:        Mood and Affect: Mood normal.        Behavior: Behavior normal.        Thought Content: Thought content normal.        Judgment: Judgment normal.     ASSESSMENT/PLAN:  Mood disorder (HCC) Assessment & Plan: Chronic.  Stable.  Increase in PHQ9, no change in GAD7 Denies any SI/HI. Continue to encourage CBT Continue Cymbalta 60 mg BID Continue Celexa 10 mg daily Strict return  precautions provided  Orders: -     DULoxetine HCl; Take 1 capsule (60 mg total) by mouth 2 (two) times daily.  Dispense: 180 capsule; Refill: 3 -     Citalopram Hydrobromide; Take 1 tablet (10 mg total) by mouth daily.  Dispense: 90 tablet; Refill: 1  Tremor of both hands Assessment & Plan: Likely essential tremor given no resting tremor and worsening with voluntary movement.  No shuffling of gait, falls or resting tremor.  Has been ongoing for years. History of brother with Parkinson's. -Plan to initiate Propranolol after his surgical procedure tomorrow and will follow up with him in 1 week to ensure BP and HR appropriate for medication -Follow up with Neurology as scheduled.    PDMP reviewed  Return in about 1 week (around 06/22/2022) for PCP.  Carollee Leitz, MD

## 2022-06-16 ENCOUNTER — Ambulatory Visit: Payer: PPO | Admitting: Family Medicine

## 2022-06-16 ENCOUNTER — Ambulatory Visit: Payer: PPO | Admitting: Anesthesiology

## 2022-06-16 ENCOUNTER — Ambulatory Visit
Admission: RE | Admit: 2022-06-16 | Discharge: 2022-06-16 | Disposition: A | Discharge status: Home / Self Care | Payer: PPO | Attending: Podiatry | Admitting: Podiatry

## 2022-06-16 ENCOUNTER — Other Ambulatory Visit: Payer: Self-pay

## 2022-06-16 ENCOUNTER — Encounter: Payer: Self-pay | Admitting: Podiatry

## 2022-06-16 ENCOUNTER — Ambulatory Visit: Payer: Self-pay

## 2022-06-16 ENCOUNTER — Encounter
Admission: RE | Disposition: A | Discharge status: Home / Self Care | Payer: Self-pay | Source: Home / Self Care | Attending: Podiatry

## 2022-06-16 DIAGNOSIS — F32A Depression, unspecified: Secondary | ICD-10-CM | POA: Diagnosis not present

## 2022-06-16 DIAGNOSIS — F4323 Adjustment disorder with mixed anxiety and depressed mood: Secondary | ICD-10-CM | POA: Insufficient documentation

## 2022-06-16 DIAGNOSIS — K219 Gastro-esophageal reflux disease without esophagitis: Secondary | ICD-10-CM | POA: Diagnosis not present

## 2022-06-16 DIAGNOSIS — M2012 Hallux valgus (acquired), left foot: Secondary | ICD-10-CM | POA: Diagnosis not present

## 2022-06-16 DIAGNOSIS — Z8546 Personal history of malignant neoplasm of prostate: Secondary | ICD-10-CM | POA: Diagnosis not present

## 2022-06-16 DIAGNOSIS — Z87891 Personal history of nicotine dependence: Secondary | ICD-10-CM | POA: Insufficient documentation

## 2022-06-16 DIAGNOSIS — M2042 Other hammer toe(s) (acquired), left foot: Secondary | ICD-10-CM | POA: Diagnosis not present

## 2022-06-16 DIAGNOSIS — G709 Myoneural disorder, unspecified: Secondary | ICD-10-CM | POA: Insufficient documentation

## 2022-06-16 DIAGNOSIS — M216X2 Other acquired deformities of left foot: Secondary | ICD-10-CM | POA: Diagnosis not present

## 2022-06-16 HISTORY — DX: Presence of dental prosthetic device (complete) (partial): Z97.2

## 2022-06-16 HISTORY — PX: BUNIONECTOMY: SHX129

## 2022-06-16 HISTORY — PX: WEIL OSTEOTOMY: SHX5044

## 2022-06-16 HISTORY — PX: HAMMER TOE SURGERY: SHX385

## 2022-06-16 SURGERY — BUNIONECTOMY
Anesthesia: General | Site: Toe | Laterality: Left

## 2022-06-16 MED ORDER — FENTANYL CITRATE (PF) 100 MCG/2ML IJ SOLN
INTRAMUSCULAR | Status: DC | PRN
  Administered 2022-06-16: 75 ug via INTRAVENOUS
  Administered 2022-06-16: 25 ug via INTRAVENOUS

## 2022-06-16 MED ORDER — ONDANSETRON HCL 4 MG/2ML IJ SOLN: 4.0000 mg | Freq: Four times a day (QID) | INTRAMUSCULAR | Status: DC | PRN

## 2022-06-16 MED ORDER — LACTATED RINGERS IV SOLN: INTRAVENOUS | Status: DC

## 2022-06-16 MED ORDER — OXYCODONE-ACETAMINOPHEN 5-325 MG PO TABS: 1.0000 | ORAL_TABLET | Freq: Four times a day (QID) | ORAL | 0 refills | Status: DC | PRN

## 2022-06-16 MED ORDER — OXYCODONE HCL 5 MG/5ML PO SOLN: 5.0000 mg | Freq: Once | ORAL | Status: DC | PRN

## 2022-06-16 MED ORDER — OXYCODONE HCL 5 MG PO TABS: 5.0000 mg | ORAL_TABLET | Freq: Once | ORAL | Status: DC | PRN

## 2022-06-16 MED ORDER — BUPIVACAINE HCL (PF) 0.25 % IJ SOLN
INTRAMUSCULAR | Status: DC | PRN
  Administered 2022-06-16: 10 mL

## 2022-06-16 MED ORDER — ONDANSETRON HCL 4 MG PO TABS: 4.0000 mg | ORAL_TABLET | Freq: Four times a day (QID) | ORAL | Status: DC | PRN

## 2022-06-16 MED ORDER — METOCLOPRAMIDE HCL 5 MG PO TABS: 5.0000 mg | ORAL_TABLET | Freq: Three times a day (TID) | ORAL | Status: DC | PRN

## 2022-06-16 MED ORDER — KETOROLAC TROMETHAMINE 15 MG/ML IJ SOLN
INTRAMUSCULAR | Status: DC | PRN
  Administered 2022-06-16: 15 mg via INTRAVENOUS

## 2022-06-16 MED ORDER — PROPOFOL 10 MG/ML IV BOLUS
INTRAVENOUS | Status: DC | PRN
  Administered 2022-06-16: 160 mg via INTRAVENOUS

## 2022-06-16 MED ORDER — MIDAZOLAM HCL 5 MG/5ML IJ SOLN
INTRAMUSCULAR | Status: DC | PRN
  Administered 2022-06-16: 2 mg via INTRAVENOUS

## 2022-06-16 MED ORDER — DEXAMETHASONE SODIUM PHOSPHATE 4 MG/ML IJ SOLN
INTRAMUSCULAR | Status: DC | PRN
  Administered 2022-06-16: 4 mg via INTRAVENOUS

## 2022-06-16 MED ORDER — FENTANYL CITRATE PF 50 MCG/ML IJ SOSY: 25.0000 ug | PREFILLED_SYRINGE | INTRAMUSCULAR | Status: DC | PRN

## 2022-06-16 MED ORDER — LIDOCAINE HCL (CARDIAC) PF 100 MG/5ML IV SOSY
PREFILLED_SYRINGE | INTRAVENOUS | Status: DC | PRN
  Administered 2022-06-16: 100 mg via INTRATRACHEAL

## 2022-06-16 MED ORDER — METOCLOPRAMIDE HCL 5 MG/ML IJ SOLN: 5.0000 mg | Freq: Three times a day (TID) | INTRAMUSCULAR | Status: DC | PRN

## 2022-06-16 MED ORDER — CEFAZOLIN SODIUM-DEXTROSE 2-4 GM/100ML-% IV SOLN
2.0000 g | INTRAVENOUS | Status: AC
  Administered 2022-06-16: 2 g via INTRAVENOUS

## 2022-06-16 MED ORDER — ACETAMINOPHEN 10 MG/ML IV SOLN
INTRAVENOUS | Status: DC | PRN
  Administered 2022-06-16: 1000 mg via INTRAVENOUS

## 2022-06-16 MED ORDER — SODIUM CHLORIDE 0.9 % IR SOLN
Status: DC | PRN
  Administered 2022-06-16: 1

## 2022-06-16 MED ORDER — BUPIVACAINE LIPOSOME 1.3 % IJ SUSP
INTRAMUSCULAR | Status: DC | PRN
  Administered 2022-06-16: 10 mL

## 2022-06-16 MED ORDER — ONDANSETRON HCL 4 MG/2ML IJ SOLN
INTRAMUSCULAR | Status: DC | PRN
  Administered 2022-06-16: 4 mg via INTRAVENOUS

## 2022-06-16 SURGICAL SUPPLY — 60 items
APL SKNCLS STERI-STRIP NONHPOA (GAUZE/BANDAGES/DRESSINGS) ×1
BENZOIN TINCTURE PRP APPL 2/3 (GAUZE/BANDAGES/DRESSINGS) ×1 IMPLANT
BIT DRILL 1.7 LNG CANN (DRILL) IMPLANT
BLADE MED AGGRESSIVE (BLADE) IMPLANT
BLADE OSC/SAGITTAL 5.5X25 (BLADE) IMPLANT
BLADE OSC/SAGITTAL MD 5.5X18 (BLADE) IMPLANT
BLADE SURG 15 STRL LF DISP TIS (BLADE) IMPLANT
BLADE SURG 15 STRL SS (BLADE) ×2
BNDG CMPR 5X4 CHSV STRCH STRL (GAUZE/BANDAGES/DRESSINGS) ×1
BNDG CMPR 75X41 PLY HI ABS (GAUZE/BANDAGES/DRESSINGS) ×1
BNDG CMPR STD VLCR NS LF 5.8X4 (GAUZE/BANDAGES/DRESSINGS) ×1
BNDG COHESIVE 4X5 TAN STRL LF (GAUZE/BANDAGES/DRESSINGS) ×1 IMPLANT
BNDG ELASTIC 4X5.8 VLCR NS LF (GAUZE/BANDAGES/DRESSINGS) ×1 IMPLANT
BNDG ELASTIC 4X5.8 VLCR STR LF (GAUZE/BANDAGES/DRESSINGS) ×1 IMPLANT
BNDG ESMARCH 4 X 12 STRL LF (GAUZE/BANDAGES/DRESSINGS) ×1
BNDG ESMARCH 4X12 STRL LF (GAUZE/BANDAGES/DRESSINGS) ×1 IMPLANT
BNDG GAUZE DERMACEA FLUFF 4 (GAUZE/BANDAGES/DRESSINGS) ×1 IMPLANT
BNDG GZE DERMACEA 4 6PLY (GAUZE/BANDAGES/DRESSINGS) ×1
BNDG STRETCH 4X75 STRL LF (GAUZE/BANDAGES/DRESSINGS) ×1 IMPLANT
CANISTER SUCT 1200ML W/VALVE (MISCELLANEOUS) ×1 IMPLANT
CNTRSNK DRL 2 SCR (MISCELLANEOUS) IMPLANT
COUNTERSINK 2.0 (MISCELLANEOUS) ×1
COUNTERSINK HEADED 2.5 (ORTHOPEDIC DISPOSABLE SUPPLIES) ×1
COUNTERSINK HEADLESS 2.5 (ORTHOPEDIC DISPOSABLE SUPPLIES) ×1
COVER LIGHT HANDLE UNIVERSAL (MISCELLANEOUS) ×2 IMPLANT
DRAPE FLUOR MINI C-ARM 54X84 (DRAPES) ×1 IMPLANT
DURAPREP 26ML APPLICATOR (WOUND CARE) ×1 IMPLANT
ELECT REM PT RETURN 9FT ADLT (ELECTROSURGICAL) ×1
ELECTRODE REM PT RTRN 9FT ADLT (ELECTROSURGICAL) ×1 IMPLANT
GAUZE SPONGE 4X4 12PLY STRL (GAUZE/BANDAGES/DRESSINGS) ×1 IMPLANT
GAUZE XEROFORM 1X8 LF (GAUZE/BANDAGES/DRESSINGS) ×1 IMPLANT
GLOVE SRG 8 PF TXTR STRL LF DI (GLOVE) ×2 IMPLANT
GLOVE SURG ENC MOIS LTX SZ7.5 (GLOVE) ×2 IMPLANT
GLOVE SURG UNDER POLY LF SZ8 (GLOVE) ×2
GOWN STRL REUS W/ TWL LRG LVL3 (GOWN DISPOSABLE) ×2 IMPLANT
GOWN STRL REUS W/TWL LRG LVL3 (GOWN DISPOSABLE) ×2
K-WIRE DBL END TROCAR 6X.045 (WIRE) ×1
K-WIRE DBL END TROCAR 6X.062 (WIRE)
KIT TURNOVER KIT A (KITS) ×1 IMPLANT
KWIRE DBL END TROCAR 6X.045 (WIRE) IMPLANT
KWIRE DBL END TROCAR 6X.062 (WIRE) IMPLANT
MICROAIRE KWIRE 0.045 (Wire) IMPLANT
NS IRRIG 500ML POUR BTL (IV SOLUTION) ×1 IMPLANT
PACK EXTREMITY ARMC (MISCELLANEOUS) ×1 IMPLANT
PIN BALLS 3/8 F/.045 WIRE (MISCELLANEOUS) ×1 IMPLANT
RASP SM TEAR CROSS CUT (RASP) IMPLANT
SCREW CANN HDLS ST 2.5X20 (Screw) IMPLANT
SCREW COUNTERSINK HEADED 2.5 (ORTHOPEDIC DISPOSABLE SUPPLIES) IMPLANT
SCREW COUNTERSINK HEADLESS 2.5 (ORTHOPEDIC DISPOSABLE SUPPLIES) IMPLANT
SCREW HEADLESS SHRT THRD 2X12 (Screw) IMPLANT
STOCKINETTE IMPERVIOUS LG (DRAPES) ×1 IMPLANT
STRIP CLOSURE SKIN 1/4X4 (GAUZE/BANDAGES/DRESSINGS) ×1 IMPLANT
SUT MNCRL 4-0 (SUTURE) ×1
SUT MNCRL 4-0 27XMFL (SUTURE) ×1
SUT VIC AB 3-0 SH 27 (SUTURE) ×1
SUT VIC AB 3-0 SH 27X BRD (SUTURE) IMPLANT
SUT VIC AB 4-0 SH 27 (SUTURE) ×2
SUT VIC AB 4-0 SH 27XANBCTRL (SUTURE) IMPLANT
SUTURE MNCRL 4-0 27XMF (SUTURE) ×1 IMPLANT
WIRE SMOOTH TROCAR .9MMX150MML (WIRE) IMPLANT

## 2022-06-16 NOTE — Transfer of Care (Signed)
Immediate Anesthesia Transfer of Care Note  Patient: Benjamin Mills  Procedure(s) Performed: Lillard Anes Liane Comber MITCHELL (Left: Toe) WEIL OSTEOTOMY (Left: Toe) HAMMER TOE CORRECTION (Left: Toe)  Patient Location: PACU  Anesthesia Type: General LMA  Level of Consciousness: awake, alert  and patient cooperative  Airway and Oxygen Therapy: Patient Spontanous Breathing and Patient connected to supplemental oxygen  Post-op Assessment: Post-op Vital signs reviewed, Patient's Cardiovascular Status Stable, Respiratory Function Stable, Patent Airway and No signs of Nausea or vomiting  Post-op Vital Signs: Reviewed and stable  Complications: No notable events documented.

## 2022-06-16 NOTE — Telephone Encounter (Signed)
Patient was called and is surgery is today and he stated the appointment that is set is appropriate to see Dr. Volanda Napoleon.  Benjamin Mills,cma

## 2022-06-16 NOTE — Op Note (Signed)
Operative note   Surgeon:Kathlyne Loud Lawyer: None    Preop diagnosis: 1.  Hallux valgus deformity left foot 2.  Hammertoe contracture left second toe 3.  Plantarflexed left second metatarsal    Postop diagnosis: Same    Procedure: 1.  Austin hallux valgus correction left foot 2.  PIPJ arthrodesis left second toe with K wire stabilization 3.  Weil second metatarsal osteotomy left second metatarsal 4.  Intraoperative fluoroscopy use without assistance of radiologist    EBL: Minimal    Anesthesia:local and general.  Local consisted of a total of 20 cc of a one-to-one mixture of 0.25% plain bupivacaine and Exparel long-acting anesthetic    Hemostasis: Midcalf tourniquet inflated to 200 mmHg for approximately 75 minutes    Specimen: None    Complications: None    Operative indications:Benjamin Mills is an 77 y.o. that presents today for surgical intervention.  The risks/benefits/alternatives/complications have been discussed and consent has been given.    Procedure:  Patient was brought into the OR and placed on the operating table in thesupine position. After anesthesia was obtained theleft lower extremity was prepped and draped in usual sterile fashion.  Attention was directed to the dorsomedial left first MTPJ where a dorsomedial incision was performed.  Sharp and blunt dissection carried down to the capsule.  The intermetatarsal space was entered.  The DTI L was transected.  The conjoined tendon of the abductor was noted and transected off the base of the proximal phalanx.  Better flexibility of the first MTPJ was noted at this time.  Attention was redirected to the medial aspect where the T capsulotomy was performed.  The dorsomedial eminence was noted and transected with a power saw.  At this time a V osteotomy was created.  The metatarsal head was translocated laterally.  This was then stabilized with a 2.5 millimeter screw from the Paragon cannulated screw set.  Good stability  and realignment was noted.  The ensuing overhanging ledge was transected with a power saw and smoothed with a power rasp.  At this time marked realignment of the first MTPJ was noted.  No need for Aiken osteotomy at this time.  Attention was directed to the second toe where a curvilinear incision was performed from the PIPJ of the second toe crossing the MTPJ.  Sharp and blunt dissection carried down to the long extensor tendon.  This was transected at the PIPJ and reflected proximally.  At this time the dorsal medial and lateral capsule of the MTPJ was released.  A McGlamery elevator was used to release the plantar plate.  Dorsal positioning of the second toe was still noted.  A Weil osteotomy was created that the metatarsal head to reflect slightly more proximal.  This was then stabilized with a 2.0 mm headless Paragon 28 cannulated screw.  Good stability and realignment was noted on fluoroscopy.  At this time the head of the proximal phalanx and base of the middle phalanx were removed.  A 0.045 K wire was driven from the base of the middle phalanx to the tip of the toe and retrograded back across the PIPJ to the base of the second toe.  Good alignment was noted in all planes both clinically and on fluoroscopy.  Wounds were flushed with copious amounts of irrigation.  An extensor lengthening procedure was performed and this was stabilized with a 4-0 Vicryl.  The subcutaneous tissue was reapproximated with a 4-0 Vicryl.  The capsule of the MTPJ was reapproximated  with a 3-0 Vicryl and a small capsulorrhaphy had been performed.  The skin was reapproximated to the first MTPJ with a Monocryl and a 3-0 nylon for the second toe and joint.  The K wire was cut and a Jurgens ball was applied.  The tourniquet was released.  Good perfusion of the toes were noted.  A bulky sterile dressing was applied.    Patient tolerated the procedure and anesthesia well.  Was transported from the OR to the PACU with all vital signs  stable and vascular status intact. To be discharged per routine protocol.  Will follow up in approximately 1 week in the outpatient clinic.

## 2022-06-16 NOTE — Anesthesia Procedure Notes (Addendum)
Procedure Name: LMA Insertion Date/Time: 06/16/2022 2:14 PM  Performed by: Grant Swager, [place], CRNAPre-anesthesia Checklist: Patient identified, Patient being monitored, Timeout performed, Emergency Drugs available and Suction available Patient Re-evaluated:Patient Re-evaluated prior to induction Oxygen Delivery Method: Circle system utilized Preoxygenation: Pre-oxygenation with 100% oxygen Induction Type: IV induction Ventilation: Mask ventilation without difficulty LMA: LMA inserted LMA Size: 4.0 Tube type: Oral Number of attempts: 1 Placement Confirmation: positive ETCO2 and breath sounds checked- equal and bilateral Tube secured with: Tape Dental Injury: Teeth and Oropharynx as per pre-operative assessment

## 2022-06-16 NOTE — Anesthesia Preprocedure Evaluation (Signed)
Anesthesia Evaluation  Patient identified by MRN, date of birth, ID band Patient awake    Reviewed: Allergy & Precautions, NPO status , Patient's Chart, lab work & pertinent test results  History of Anesthesia Complications Negative for: history of anesthetic complications  Airway Mallampati: III  TM Distance: >3 FB Neck ROM: full    Dental  (+) Missing   Pulmonary neg shortness of breath, former smoker   Pulmonary exam normal        Cardiovascular Exercise Tolerance: Good (-) angina (-) Past MI negative cardio ROS Normal cardiovascular exam     Neuro/Psych  PSYCHIATRIC DISORDERS  Depression     Neuromuscular disease    GI/Hepatic Neg liver ROS, PUD,GERD  Controlled,,  Endo/Other  negative endocrine ROS    Renal/GU Renal disease     Musculoskeletal   Abdominal   Peds  Hematology negative hematology ROS (+)   Anesthesia Other Findings Past Medical History: 04/08/2020: Abnormal MRI, shoulder 08/06/2014: Adjustment disorder with mixed anxiety and depressed mood No date: Amputation of finger, left     Comment:  5th finger  02/17/2018: Arthralgia No date: Arthritis     Comment:  hands, back lumbar sacral  11/05/2015: Benign skin lesion 07/31/2019: Bilateral carotid artery stenosis 01/30/2019: Bilateral impacted cerumen 08/16/2017: Bunion of great toe of left foot 2006: Cancer ([place])     Comment:  Prostate cancer with resection radical prostate removal.              Urologist Dr. Hollice Espy locally; prostate ca dx'ed               in 2007 and 2015  No date: Carotid artery stenosis     Comment:  07/26/16 US carotid b/l 1-39%  No date: Cataract     Comment:  s/p surgery b/l Dr. Oralia Manis [facility] 2018 No date: Depression 04/08/2020: Disorder of left rotator cuff No date: Diverticulosis No date: Erectile dysfunction 12/10/2014: Erectile dysfunction following radical prostatectomy No date: Gastric  ulcer No date: GERD (gastroesophageal reflux disease)     Comment:  occ no meds 06/05/2014: H/O cold sores No date: Hepatic steatosis No date: History of kidney stones     Comment:  h/o 06/05/2014: History of prostate cancer No date: History of splenectomy No date: Hyperlipidemia 02/17/2018: Kidney stone on right side 10/17/2020: Left foot pain 02/15/2020: Nontraumatic complete tear of left rotator cuff 02/01/2020: Osteoarthritis of left shoulder 02/26/2019: Primary localized osteoarthrosis, hand 05/11/2019: Primary osteoarthritis of left knee 10/17/2020: Rupture of left biceps tendon 08/16/2017: Seborrheic keratoses No date: Spinal stenosis No date: Spinal stenosis at L4-L5 level 08/16/2017: Spinal stenosis at L4-L5 level 05/05/2020: Status post total knee replacement using cement, left 12/10/2014: SUI (stress urinary incontinence), male 02/15/2020: Tendinitis of upper biceps tendon of left shoulder No date: Thoracic aortic atherosclerosis (HCC) No date: Urinary stress incontinence, male No date: Wears dentures     Comment:  full upper and lower  Past Surgical History: No date: COLONOSCOPY No date: INCONTINENCE SURGERY 1969: KNEE ARTHROSCOPY; N/A     Comment:  meniscal tear. left  2007: PROSTATECTOMY     Comment:  removal of prostate No date: REPAIR OF PERFORATED ULCER     Comment:  2009 1953: SPLENECTOMY     Comment:  after trauma, fell out tree as kid 04/29/2020: TOTAL KNEE ARTHROPLASTY; Left     Comment:  Procedure: TOTAL KNEE ARTHROPLASTY;  Surgeon: Milagros Evener  J, MD;  Location: [facility] ORS;  Service: Orthopedics;                Laterality: Left;  BMI    Body Mass Index: 27.60 kg/m      Reproductive/Obstetrics negative OB ROS                             Anesthesia Physical Anesthesia Plan  ASA: 3  Anesthesia Plan: General LMA   Post-op Pain Management:    Induction: Intravenous  PONV Risk Score and Plan:  Dexamethasone, Ondansetron, Midazolam and Treatment may vary due to age or medical condition  Airway Management Planned: LMA  Additional Equipment:   Intra-op Plan:   Post-operative Plan: Extubation in OR  Informed Consent: I have reviewed the patients History and Physical, chart, labs and discussed the procedure including the risks, benefits and alternatives for the proposed anesthesia with the patient or authorized representative who has indicated his/her understanding and acceptance.     Dental Advisory Given  Plan Discussed with: Anesthesiologist, CRNA and Surgeon  Anesthesia Plan Comments: (Patient consented for risks of anesthesia including but not limited to:  - adverse reactions to medications - damage to eyes, teeth, lips or other oral mucosa - nerve damage due to positioning  - sore throat or hoarseness - Damage to heart, brain, nerves, lungs, other parts of body or loss of life  Patient voiced understanding.)       Anesthesia Quick Evaluation

## 2022-06-16 NOTE — Anesthesia Postprocedure Evaluation (Signed)
Anesthesia Post Note  Patient: Jasmine Awe Oatis  Procedure(s) Performed: Lillard Anes Liane Comber MITCHELL (Left: Toe) WEIL OSTEOTOMY (Left: Toe) HAMMER TOE CORRECTION (Left: Toe)  Patient location during evaluation: PACU Anesthesia Type: General Level of consciousness: awake and alert Pain management: pain level controlled Vital Signs Assessment: post-procedure vital signs reviewed and stable Respiratory status: spontaneous breathing, nonlabored ventilation, respiratory function stable and patient connected to nasal cannula oxygen Cardiovascular status: blood pressure returned to baseline and stable Postop Assessment: no apparent nausea or vomiting Anesthetic complications: no   No notable events documented.   Last Vitals:  Vitals:   06/16/22 1600 06/16/22 1605  BP: 130/79   Pulse: 79 79  Resp: 18 19  Temp:  (!) 36.3 C  SpO2: 96% 96%    Last Pain:  Vitals:   06/16/22 1605  TempSrc:   PainSc: 0-No pain                 Precious Haws [place] Korver

## 2022-06-16 NOTE — H&P (Signed)
HISTORY AND PHYSICAL INTERVAL NOTE:  06/16/2022  1:55 PM  Benjamin Mills  has presented today for surgery, with the diagnosis of M20.42 - Hammertoe of left foot M20.12 - Hallux valgus of left foot.  The various methods of treatment have been discussed with the patient.  No guarantees were given.  After consideration of risks, benefits and other options for treatment, the patient has consented to surgery.  I have reviewed the patients' chart and labs.     A history and physical examination was performed in my office.  The patient was reexamined.  There have been no changes to this history and physical examination.  Samara Deist A

## 2022-06-17 NOTE — Telephone Encounter (Signed)
Medication has not been called in and dosage not  in chart in provider note please advise dosage?

## 2022-06-18 ENCOUNTER — Encounter: Payer: Self-pay | Admitting: Family Medicine

## 2022-06-20 ENCOUNTER — Encounter: Payer: Self-pay | Admitting: Family Medicine

## 2022-06-20 NOTE — Assessment & Plan Note (Signed)
Likely essential tremor given no resting tremor and worsening with voluntary movement.  No shuffling of gait, falls or resting tremor.  Has been ongoing for years. History of brother with Parkinson's. -Plan to initiate Propranolol after his surgical procedure tomorrow and will follow up with him in 1 week to ensure BP and HR appropriate for medication -Follow up with Neurology as scheduled.

## 2022-06-20 NOTE — Assessment & Plan Note (Signed)
Chronic.  Stable.  Increase in PHQ9, no change in GAD7 Denies any SI/HI. Continue to encourage CBT Continue Cymbalta 60 mg BID Continue Celexa 10 mg daily Strict return precautions provided

## 2022-06-21 ENCOUNTER — Encounter: Payer: Self-pay | Admitting: Podiatry

## 2022-06-22 ENCOUNTER — Ambulatory Visit (INDEPENDENT_AMBULATORY_CARE_PROVIDER_SITE_OTHER): Payer: PPO | Admitting: Family Medicine

## 2022-06-22 ENCOUNTER — Encounter: Payer: Self-pay | Admitting: Family Medicine

## 2022-06-22 VITALS — BP 130/70 | HR 94 | Temp 97.5°F | Ht 68.0 in | Wt 184.8 lb

## 2022-06-22 DIAGNOSIS — M2042 Other hammer toe(s) (acquired), left foot: Secondary | ICD-10-CM | POA: Diagnosis not present

## 2022-06-22 DIAGNOSIS — G250 Essential tremor: Secondary | ICD-10-CM

## 2022-06-22 DIAGNOSIS — M2012 Hallux valgus (acquired), left foot: Secondary | ICD-10-CM | POA: Diagnosis not present

## 2022-06-22 DIAGNOSIS — M79672 Pain in left foot: Secondary | ICD-10-CM | POA: Diagnosis not present

## 2022-06-22 NOTE — Patient Instructions (Addendum)
It was a pleasure meeting you today. Thank you for allowing me to take part in your health care.  Our goals for today as we discussed include:  Start Propranolol 20 mg three times a day Monitor blood pressure and heart rate at home   If you have any questions or concerns, please do not hesitate to call the office at [phone].  I look forward to our next visit and until then take care and stay safe.  Regards,   Carollee Leitz, MD   [facility]

## 2022-06-22 NOTE — Progress Notes (Signed)
   SUBJECTIVE:   Chief Complaint  Patient presents with   Medical Management of Chronic Issues    Folloiw up   HPI Patient returns to clinic to discuss essential tremor treatment.  At last visit discussed initiation of propranolol if BP and heart rate tolerated postsurgery. Has been doing well status post bunionectomy. Reports tremors interfering with activities of daily.  Difficulty writing, reaching for coughing of and completing tasks.   PERTINENT PMH / PSH: Mood disorder  OBJECTIVE:  BP 130/70   Pulse 94   Temp (!) 97.5 F (36.4 C) (Oral)   Ht 5\' 8"  (1.727 m)   Wt 184 lb 12.8 oz (83.8 kg)   SpO2 99%   BMI 28.10 kg/m    Physical Exam Vitals reviewed.  Constitutional:      General: He is not in acute distress.    Appearance: Normal appearance. He is not ill-appearing, toxic-appearing or diaphoretic.  Eyes:     General:        Right eye: No discharge.        Left eye: No discharge.  Cardiovascular:     Rate and Rhythm: Normal rate.  Pulmonary:     Effort: Pulmonary effort is normal.  Musculoskeletal:        General: Normal range of motion.  Skin:    General: Skin is warm and dry.  Neurological:     Mental Status: He is alert and oriented to person, place, and time. Mental status is at baseline.     Cranial Nerves: Cranial nerves 2-12 are intact.     Sensory: Sensation is intact.     Motor: Tremor present. No weakness, atrophy or abnormal muscle tone.     Coordination: Coordination is intact.     Gait: Gait and tandem walk normal.  Psychiatric:        Mood and Affect: Mood normal.        Behavior: Behavior normal.        Thought Content: Thought content normal.        Judgment: Judgment normal.     ASSESSMENT/PLAN:  Essential tremor Assessment & Plan: New problem. Start propranolol 20 mg 3 times daily Monitor BP and heart rate at home.  If less than 100/60 or heart rate less than 60 stop medication. Follow-up if no improvement in symptoms Follow-up  with neurology as scheduled.  Orders: -     Propranolol HCl; Take 1 tablet (20 mg total) by mouth 3 (three) times daily.  Dispense: 60 tablet; Refill: 0   PDMP reviewed  Return if symptoms worsen or fail to improve.  Carollee Leitz, MD

## 2022-06-23 ENCOUNTER — Telehealth: Payer: Self-pay

## 2022-06-23 MED ORDER — PROPRANOLOL HCL 20 MG PO TABS: 20.0000 mg | ORAL_TABLET | Freq: Three times a day (TID) | ORAL | 0 refills | Status: DC

## 2022-06-23 NOTE — Telephone Encounter (Signed)
Patient's wife called and husbands prescription is still not called into pharmacy.

## 2022-06-23 NOTE — Telephone Encounter (Signed)
Patient's wife, Surya Oppedisano, called to state patient just saw Dr. Carollee Leitz yesterday and she wanted him to start taking a new medication.  Sherri states she doesn't remember the name of the medication, but patient is at pharmacy now and the medication has not been called in.  Sherri states patient just had surgery on his foot, so she doesn't want him to have to make another trip to the [facility] on [address].  I was unable to reach Fulton Mole, CMA, during call, so I let Sherri know that I cannot promise that the medication will be called in while patient is at pharmacy.  I let Sherri know that I will send a note to Dr. Carollee Leitz.

## 2022-06-26 ENCOUNTER — Encounter: Payer: Self-pay | Admitting: Family Medicine

## 2022-06-26 DIAGNOSIS — G250 Essential tremor: Secondary | ICD-10-CM | POA: Insufficient documentation

## 2022-06-26 NOTE — Assessment & Plan Note (Signed)
New problem. Start propranolol 20 mg 3 times daily Monitor BP and heart rate at home.  If less than 100/60 or heart rate less than 60 stop medication. Follow-up if no improvement in symptoms Follow-up with neurology as scheduled.

## 2022-06-28 NOTE — Telephone Encounter (Signed)
-----   Message from Carollee Leitz, MD sent at 06/20/2022  1:32 PM EDT ----- Can we please get him scheduled in on march 12 or sometime close after that?  I had some virtual visits but looks like everything is booked now and I need to see him.  I'm ok with a double booking as lok as he knows that his appointment is double booked and he may have to wait.  A telephone visit may be offered as well  thanks

## 2022-06-28 NOTE — Telephone Encounter (Signed)
Patient scheduled and seen.  Kali Deadwyler,cma

## 2022-06-30 DIAGNOSIS — H40013 Open angle with borderline findings, low risk, bilateral: Secondary | ICD-10-CM | POA: Diagnosis not present

## 2022-06-30 DIAGNOSIS — Z961 Presence of intraocular lens: Secondary | ICD-10-CM | POA: Diagnosis not present

## 2022-06-30 DIAGNOSIS — H35362 Drusen (degenerative) of macula, left eye: Secondary | ICD-10-CM | POA: Diagnosis not present

## 2022-06-30 DIAGNOSIS — H04123 Dry eye syndrome of bilateral lacrimal glands: Secondary | ICD-10-CM | POA: Diagnosis not present

## 2022-06-30 DIAGNOSIS — H43813 Vitreous degeneration, bilateral: Secondary | ICD-10-CM | POA: Diagnosis not present

## 2022-07-02 DIAGNOSIS — Z9889 Other specified postprocedural states: Secondary | ICD-10-CM | POA: Diagnosis not present

## 2022-07-02 DIAGNOSIS — M10072 Idiopathic gout, left ankle and foot: Secondary | ICD-10-CM | POA: Diagnosis not present

## 2022-07-13 ENCOUNTER — Ambulatory Visit (INDEPENDENT_AMBULATORY_CARE_PROVIDER_SITE_OTHER): Payer: PPO | Admitting: Family Medicine

## 2022-07-13 DIAGNOSIS — G250 Essential tremor: Secondary | ICD-10-CM

## 2022-07-13 DIAGNOSIS — D2261 Melanocytic nevi of right upper limb, including shoulder: Secondary | ICD-10-CM | POA: Diagnosis not present

## 2022-07-13 DIAGNOSIS — D2262 Melanocytic nevi of left upper limb, including shoulder: Secondary | ICD-10-CM | POA: Diagnosis not present

## 2022-07-13 DIAGNOSIS — Z85828 Personal history of other malignant neoplasm of skin: Secondary | ICD-10-CM | POA: Diagnosis not present

## 2022-07-13 DIAGNOSIS — Z09 Encounter for follow-up examination after completed treatment for conditions other than malignant neoplasm: Secondary | ICD-10-CM | POA: Diagnosis not present

## 2022-07-13 DIAGNOSIS — D2271 Melanocytic nevi of right lower limb, including hip: Secondary | ICD-10-CM | POA: Diagnosis not present

## 2022-07-13 DIAGNOSIS — D225 Melanocytic nevi of trunk: Secondary | ICD-10-CM | POA: Diagnosis not present

## 2022-07-13 DIAGNOSIS — Z872 Personal history of diseases of the skin and subcutaneous tissue: Secondary | ICD-10-CM | POA: Diagnosis not present

## 2022-07-13 MED ORDER — PROPRANOLOL HCL 20 MG PO TABS: 20.0000 mg | ORAL_TABLET | Freq: Three times a day (TID) | ORAL | 1 refills | Status: DC

## 2022-07-13 NOTE — Patient Instructions (Addendum)
It was a pleasure seeing you today. Thank you for allowing me to take part in your health care.  Our goals for today as we discussed include:  Continue Propranolol 20 mg three times a day Refill has been sent  Continue all other medications as prescribed  Hemoglobin mildly low.  Likely from recent surgery.  Will repeat at next visit  Medicare Annual well visit due in August.  Can schedule for Tues or Thurs   Recommend Shingles vaccine.  This is a 2 dose series and can be given at your local pharmacy.  Please talk to your pharmacist about this.   Follow up in 3 months  If you have any questions or concerns, please do not hesitate to call the office at [phone].  I look forward to our next visit and until then take care and stay safe.  Regards,   Carollee Leitz, MD   [employer]    Sleep Hygiene  - Try the following to help you sleep better:  - limit naps during the day  - no screens (TV, phone, tablet, computer) at least 1-2 hours before bedtime.  - have a quiet and dark sleeping environment.  - no large meals or drinks about 1 hour before bed.  - Avoid taking diuretics (hydrochlorothiazide, furosemide) in the evenings.  - Avoid caffeine after 3pm.  - Exercise or move your body regularly every day.  - You can also try melatonin 10 mg over the counter. Take this 1-2 hours before bed. You can increase to 20mg  if this is not helpful. - If you are lying in bed for 30 mins-1 hour and aren't falling asleep, get out of bed and do something relaxing like reading (NO TV!) until you are tired.

## 2022-07-13 NOTE — Progress Notes (Signed)
   SUBJECTIVE:   Chief Complaint  Patient presents with   Medical Management of Chronic Issues    Involuntary hand movement X 4 months   HPI Patient presents to clinic for follow up bilateral hand tremor  Last seen in clinic on 03/12 and Propranolol 20 mg TID initiated.  Since then patient reports tremor have significantly decreased.  Has not seen Neurology and canceled appointment as symptoms have improved with propranolol.  Denies any chest pain, dizziness, weakness, headaches or fatigue.  Requesting refill for propranolol.   PERTINENT PMH / PSH: Mood disorder Essential tremor  OBJECTIVE:  BP 115/78   Pulse (!) 53   Temp (!) 97.4 F (36.3 C) (Oral)   Ht 5\' 8"  (1.727 m)   Wt 189 lb 9.6 oz (86 kg)   SpO2 97%   BMI 28.83 kg/m    Physical Exam Neurological:     General: No focal deficit present.     Mental Status: He is alert and oriented to person, place, and time. Mental status is at baseline.     Cranial Nerves: Cranial nerves 2-12 are intact.     Coordination: Coordination is intact.     Gait: Gait is intact.  Psychiatric:        Attention and Perception: Attention normal.        Mood and Affect: Mood normal.        Speech: Speech normal.        Behavior: Behavior normal.        Thought Content: Thought content does not include homicidal or suicidal ideation. Thought content does not include homicidal or suicidal plan.        Cognition and Memory: Cognition and memory normal.        Judgment: Judgment normal.     ASSESSMENT/PLAN:  Essential tremor Assessment & Plan: Improving with initiation of propranolol. Self discontinued neurology appointment Refill propranolol 20 mg 3 times daily Monitor BP and heart rate at home.  If less than 100/60 or heart rate less than 60 stop medication. Follow-up in 3 months   Orders: -     Propranolol HCl; Take 1 tablet (20 mg total) by mouth 3 (three) times daily.  Dispense: 270 tablet; Refill: 1   PDMP reviewed  Return  in about 3 months (around 10/12/2022) for PCP.  Dana Allan, MD

## 2022-07-13 NOTE — Assessment & Plan Note (Addendum)
Improving with initiation of propranolol. Self discontinued neurology appointment Refill propranolol 20 mg 3 times daily Monitor BP and heart rate at home.  If less than 100/60 or heart rate less than 60 stop medication. Follow-up in 3 months

## 2022-07-17 ENCOUNTER — Encounter: Payer: Self-pay | Admitting: Family Medicine

## 2022-07-29 DIAGNOSIS — Z9889 Other specified postprocedural states: Secondary | ICD-10-CM | POA: Diagnosis not present

## 2022-07-29 DIAGNOSIS — M79672 Pain in left foot: Secondary | ICD-10-CM | POA: Diagnosis not present

## 2022-07-29 DIAGNOSIS — M10072 Idiopathic gout, left ankle and foot: Secondary | ICD-10-CM | POA: Diagnosis not present

## 2022-09-21 DIAGNOSIS — M2042 Other hammer toe(s) (acquired), left foot: Secondary | ICD-10-CM | POA: Diagnosis not present

## 2022-09-21 DIAGNOSIS — M2012 Hallux valgus (acquired), left foot: Secondary | ICD-10-CM | POA: Diagnosis not present

## 2022-10-12 ENCOUNTER — Encounter: Payer: Self-pay | Admitting: Family Medicine

## 2022-10-12 ENCOUNTER — Ambulatory Visit: Payer: PPO | Admitting: Family Medicine

## 2022-10-12 VITALS — BP 128/70 | HR 58 | Temp 97.9°F | Resp 16 | Ht 68.0 in | Wt 193.4 lb

## 2022-10-12 DIAGNOSIS — G250 Essential tremor: Secondary | ICD-10-CM | POA: Diagnosis not present

## 2022-10-12 MED ORDER — PROPRANOLOL HCL 40 MG PO TABS: 40.0000 mg | ORAL_TABLET | Freq: Two times a day (BID) | ORAL | 3 refills | Status: DC

## 2022-10-12 NOTE — Progress Notes (Signed)
   SUBJECTIVE:   Chief Complaint  Patient presents with   Medical Management of Chronic Issues    3 month follow up on tremors   HPI Patient presents to clinic for follow up bilateral hand tremor  He reports significant improvement in tremor since initiation of Propranolol. Current dose 20 mg TID.  Tolerating well and would like to increase dose.  He has signed up for Essential Tremor Study.  Denies any weakness, dizziness, gait disturbance, resting tremor.    PERTINENT PMH / PSH: Mood disorder Essential tremor  OBJECTIVE:  BP 128/70   Pulse (!) 58   Temp 97.9 F (36.6 C)   Resp 16   Ht 5\' 8"  (1.727 m)   Wt 193 lb 6 oz (87.7 kg)   SpO2 98%   BMI 29.40 kg/m    Physical Exam Constitutional:      General: He is not in acute distress.    Appearance: He is normal weight. He is not ill-appearing or toxic-appearing.  HENT:     Head: Normocephalic.     Mouth/Throat:     Mouth: Mucous membranes are moist.  Eyes:     Conjunctiva/sclera: Conjunctivae normal.  Cardiovascular:     Rate and Rhythm: Bradycardia present.     Heart sounds: Normal heart sounds.  Pulmonary:     Effort: Pulmonary effort is normal.     Breath sounds: Normal breath sounds.  Skin:    General: Skin is warm.  Neurological:     General: No focal deficit present.     Mental Status: He is alert and oriented to person, place, and time. Mental status is at baseline.     Cranial Nerves: Cranial nerves 2-12 are intact.     Motor: Tremor (mild, bilateral hand when perforing tasks not at rest) present. No weakness.     Coordination: Coordination is intact.     Gait: Gait is intact. Gait normal.  Psychiatric:        Attention and Perception: Attention normal.        Mood and Affect: Mood normal.        Speech: Speech normal.        Behavior: Behavior normal.        Thought Content: Thought content normal. Thought content does not include homicidal or suicidal ideation. Thought content does not include  homicidal or suicidal plan.        Cognition and Memory: Cognition and memory normal.        Judgment: Judgment normal.     ASSESSMENT/PLAN:  Essential tremor Assessment & Plan: Chronic.  Has been slowing improving with initiation of propranolol. Self discontinued neurology appointment previously. Increase propranolol 40 mg BID Monitor BP and heart rate at home.  Can refer to Neurology if not improving.   Orders: -     Propranolol HCl; Take 1 tablet (40 mg total) by mouth 2 (two) times daily.  Dispense: 180 tablet; Refill: 3   PDMP reviewed  Return in about 6 months (around 04/14/2023) for PCP.  Dana Allan, MD

## 2022-10-12 NOTE — Patient Instructions (Addendum)
It was a pleasure meeting you today. Thank you for allowing me to take part in your health care.  Our goals for today as we discussed include:  Increase Propranolol to 40 mg two times a  day. Take 2 tablets 2 times a day Your next prescription will be  40 mg 1 tablet two times a day  You will need to call clinic for next refill.  Follow up in 6 months  Recommend Shingles vaccine.  This is a 2 dose series and can be given at your local pharmacy.  Please talk to your pharmacist about this.   Schedule Medicare Annual Wellness Visit   I value your feedback and you entrusting [place] with your care. If you get a [facility] patient survey, I would appreciate you taking the time to let us know about your experience today. Thank you!        If you have any questions or concerns, please do not hesitate to call the office at [phone].  I look forward to our next visit and until then take care and stay safe.  Regards,   Dana Allan, MD   [email or web address]

## 2022-10-27 ENCOUNTER — Encounter: Payer: Self-pay | Admitting: Family Medicine

## 2022-10-28 NOTE — Telephone Encounter (Signed)
Not mentioned in last office note

## 2022-11-07 ENCOUNTER — Encounter: Payer: Self-pay | Admitting: Family Medicine

## 2022-11-07 NOTE — Assessment & Plan Note (Addendum)
Chronic.  Has been slowing improving with initiation of propranolol. Self discontinued neurology appointment previously. Increase propranolol 40 mg BID Monitor BP and heart rate at home.  Can refer to Neurology if not improving.

## 2022-11-10 ENCOUNTER — Encounter (INDEPENDENT_AMBULATORY_CARE_PROVIDER_SITE_OTHER): Payer: Self-pay

## 2022-11-26 ENCOUNTER — Ambulatory Visit (INDEPENDENT_AMBULATORY_CARE_PROVIDER_SITE_OTHER): Payer: PPO | Admitting: *Deleted

## 2022-11-26 VITALS — Ht 68.0 in | Wt 197.0 lb

## 2022-11-26 DIAGNOSIS — Z0000 Encounter for general adult medical examination without abnormal findings: Secondary | ICD-10-CM | POA: Diagnosis not present

## 2022-11-26 NOTE — Progress Notes (Signed)
Subjective:   Benjamin Mills is a 77 y.o. male who presents for Medicare Annual/Subsequent preventive examination.  Visit Complete: Virtual  I connected with  Benjamin Mills on 11/26/22 by a audio enabled telemedicine application and verified that I am speaking with the correct person using two identifiers.  Patient Location: Home  Provider Location: Home Office  I discussed the limitations of evaluation and management by telemedicine. The patient expressed understanding and agreed to proceed.  Patient Medicare AWV questionnaire was completed by the patient on 11/26/22; I have confirmed that all information answered by patient is correct and no changes since this date.  Vital Signs: Unable to obtain new vitals due to this being a telehealth visit.   Review of Systems     Cardiac Risk Factors include: advanced age (>35men, >32 women);male gender;Other (see comment), Risk factor comments: atherosclerosis of Aorta     Objective:    Today's Vitals   11/26/22 0802 11/26/22 0803  Weight: 197 lb (89.4 kg)   Height: 5\' 8"  (1.727 m)   PainSc:  4    Body mass index is 29.95 kg/m.     11/26/2022    8:20 AM 06/16/2022   12:15 PM 11/24/2021    3:50 PM 04/29/2020    6:26 AM 04/21/2020    2:14 PM 09/14/2019    6:15 PM 01/03/2019    9:08 AM  Advanced Directives  Does Patient Have a Medical Advance Directive? No No No No Yes No No  Does patient want to make changes to medical advance directive?    No - Patient declined     Would patient like information on creating a medical advance directive? No - Patient declined No - Patient declined No - Patient declined No - Patient declined   No - Patient declined    Current Medications (verified) Outpatient Encounter Medications as of 11/26/2022  Medication Sig   citalopram (CELEXA) 10 MG tablet Take 1 tablet (10 mg total) by mouth daily.   DULoxetine (CYMBALTA) 60 MG capsule Take 1 capsule (60 mg total) by mouth 2 (two) times daily.   Multiple  Vitamin (MULTIVITAMIN) capsule Take 1 capsule by mouth daily.   propranolol (INDERAL) 40 MG tablet Take 1 tablet (40 mg total) by mouth 2 (two) times daily.   valACYclovir (VALTREX) 1000 MG tablet Take 1 tablet (1,000 mg total) by mouth 2 (two) times daily. X 3-7 days outbreak with food   No facility-administered encounter medications on file as of 11/26/2022.    Allergies (verified) Patient has no known allergies.   History: Past Medical History:  Diagnosis Date   Abnormal MRI, shoulder 04/08/2020   Adjustment disorder with mixed anxiety and depressed mood 08/06/2014   Amputation of finger, left    5th finger    Arthralgia 02/17/2018   Arthritis    hands, back lumbar sacral    Benign skin lesion 11/05/2015   Bilateral carotid artery stenosis 07/31/2019   Bilateral impacted cerumen 01/30/2019   Bunion of great toe of left foot 08/16/2017   Cancer (HCC) 2006   Prostate cancer with resection radical prostate removal. Urologist Dr. Vanna [place] locally; prostate ca dx'ed in 2007 and 2015    Carotid artery stenosis    07/26/16 US carotid b/l 1-39%    Cataract    s/p surgery b/l Dr. Lindi [facility] 2018   Depression    Disorder of left rotator cuff 04/08/2020   Diverticulosis    Erectile dysfunction  Erectile dysfunction following radical prostatectomy 12/10/2014   Gastric ulcer    GERD (gastroesophageal reflux disease)    occ no meds   H/O cold sores 06/05/2014   Hepatic steatosis    History of kidney stones    h/o   History of prostate cancer 06/05/2014   History of splenectomy    Hyperlipidemia    Kidney stone on right side 02/17/2018   Left foot pain 10/17/2020   Nontraumatic complete tear of left rotator cuff 02/15/2020   Osteoarthritis of left shoulder 02/01/2020   Primary localized osteoarthrosis, hand 02/26/2019   Primary osteoarthritis of left knee 05/11/2019   Rupture of left biceps tendon 10/17/2020   Seborrheic keratoses 08/16/2017   Spinal stenosis     Spinal stenosis at L4-L5 level    Spinal stenosis at L4-L5 level 08/16/2017   Status post total knee replacement using cement, left 05/05/2020   SUI (stress urinary incontinence), male 12/10/2014   Tendinitis of upper biceps tendon of left shoulder 02/15/2020   Thoracic aortic atherosclerosis (HCC)    Urinary stress incontinence, male    Wears dentures    full upper and lower   Past Surgical History:  Procedure Laterality Date   BUNIONECTOMY Left 06/16/2022   Procedure: BUNIONECTOMY Guinevere Scarlet;  Surgeon: Gwyneth Revels, DPM;  Location: [facility];  Service: Podiatry;  Laterality: Left;   COLONOSCOPY     HAMMER TOE SURGERY Left 06/16/2022   Procedure: HAMMER TOE CORRECTION;  Surgeon: Gwyneth Revels, DPM;  Location: [facility];  Service: Podiatry;  Laterality: Left;   INCONTINENCE SURGERY     KNEE ARTHROSCOPY N/A 1969   meniscal tear. left    PROSTATECTOMY  2007   removal of prostate   REPAIR OF PERFORATED ULCER     2009   SPLENECTOMY  1953   after trauma, fell out tree as kid   TOTAL KNEE ARTHROPLASTY Left 04/29/2020   Procedure: TOTAL KNEE ARTHROPLASTY;  Surgeon: Christena Flake, MD;  Location: [facility];  Service: Orthopedics;  Laterality: Left;   WEIL OSTEOTOMY Left 06/16/2022   Procedure: WEIL OSTEOTOMY;  Surgeon: Gwyneth Revels, DPM;  Location: [facility];  Service: Podiatry;  Laterality: Left;   Family History  Problem Relation Age of Onset   Dementia Mother    Glaucoma Mother    Alcohol abuse Father    Glaucoma Sister    Heart disease Brother        heart valve issue   Multiple sclerosis Brother    Kidney disease Neg Hx    Prostate cancer Neg Hx    Social History   Socioeconomic History   Marital status: Married    Spouse name: Not on file   Number of children: Not on file   Years of education: Not on file   Highest education level: Not on file  Occupational History   Not on file  Tobacco Use   Smoking status: Former    Current  packs/day: 0.00    Average packs/day: 1 pack/day for 7.0 years (7.0 ttl pk-yrs)    Types: Cigarettes    Start date: 06/06/1971    Quit date: 06/05/1978    Years since quitting: 44.5   Smokeless tobacco: Never   Tobacco comments:    quit in 1980s duration 6-7 YRS 1 ppd no FH lung cancer   Vaping Use   Vaping status: Never Used  Substance and Sexual Activity   Alcohol use: Yes    Alcohol/week: 4.0 standard drinks of  alcohol    Types: 4 Glasses of wine per week   Drug use: No   Sexual activity: Not Currently  Other Topics Concern   Not on file  Social History Narrative   Born in [place].   Lived in [place].    Moved to [facility], brother lives in [place]       Has 2 brothers and 1 sister.      Lives with wife in [place]. Has 3 daughters.      Work - retired, Insurance risk surveyor      Diet - regular      Exercise - walks occasionally   Social Determinants of Health   Financial Resource Strain: Low Risk  (11/26/2022)   Overall Financial Resource Strain (CARDIA)    Difficulty of Paying Living Expenses: Not very hard  Food Insecurity: No Food Insecurity (11/26/2022)   Hunger Vital Sign    Worried About Running Out of Food in the Last Year: Never true    Ran Out of Food in the Last Year: Never true  Transportation Needs: No Transportation Needs (11/26/2022)   PRAPARE - Administrator, Civil Service (Medical): No    Lack of Transportation (Non-Medical): No  Physical Activity: Insufficiently Active (11/26/2022)   Exercise Vital Sign    Days of Exercise per Week: 3 days    Minutes of Exercise per Session: 40 min  Stress: Stress Concern Present (11/26/2022)   [employer] of Occupational Health - Occupational Stress Questionnaire    Feeling of Stress : To some extent  Social Connections: Unknown (11/26/2022)   Social Connection and Isolation Panel [NHANES]    Frequency of Communication with Friends and Family: Twice a week    Frequency of Social Gatherings  with Friends and Family: Once a week    Attends Religious Services: Not on Marketing executive or Organizations: No    Attends Banker Meetings: Never    Marital Status: Married    Tobacco Counseling Counseling given: Not Answered Tobacco comments: quit in 1980s duration 6-7 YRS 1 ppd no FH lung cancer    Clinical Intake:  Pre-visit preparation completed: Yes  Pain : 0-10 Pain Score: 4  Pain Type: Chronic pain Pain Location: Hand Pain Orientation: Right, Left Pain Descriptors / Indicators: Aching Pain Onset: More than a month ago Pain Frequency: Intermittent     BMI - recorded: 29.95 Nutritional Risks: None Diabetes: No  How often do you need to have someone help you when you read instructions, pamphlets, or other written materials from your doctor or pharmacy?: 1 - Never  Interpreter Needed?: No  Information entered by :: R. Shonya Sumida LPN   Activities of Daily Living    11/26/2022    8:06 AM 11/26/2022    6:49 AM  In your present state of health, do you have any difficulty performing the following activities:  Hearing? 0 0  Vision? 0 0  Comment readers   Difficulty concentrating or making decisions? 1 0  Comment some remembering things   Walking or climbing stairs? 0 0  Dressing or bathing? 0 0  Doing errands, shopping? 0 0  Preparing Food and eating ? N N  Using the Toilet? N N  In the past six months, have you accidently leaked urine? Y Y  Comment some times when he coughs hard   Do you have problems with loss of bowel control? N N  Managing your Medications? N N  Managing your Finances? N N  Housekeeping or managing your Housekeeping? N N    Patient Care Team: Dana Allan, MD as PCP - General (Family Medicine)  Indicate any recent Medical Services you may have received from other than [facility] providers in the past year (date may be approximate).     Assessment:   This is a routine wellness examination for Sreekar.  Hearing/Vision  screen Hearing Screening - Comments:: No issues Vision Screening - Comments:: readers  Dietary issues and exercise activities discussed:     Goals Addressed             This Visit's Progress    Patient Stated       Wants to get out and walk more       Depression Screen    11/26/2022    8:14 AM 10/12/2022    8:45 AM 06/15/2022    3:34 PM 06/02/2022   10:37 AM 04/07/2022    1:03 PM 02/11/2022   11:53 AM 02/11/2022   11:39 AM  PHQ 2/9 Scores  PHQ - 2 Score 1 0 2 1 2 2 2   PHQ- 9 Score 5 2 5 7 5 9      Fall Risk    11/26/2022    6:49 AM 10/12/2022    8:45 AM 06/02/2022   10:37 AM 04/07/2022    1:02 PM 02/11/2022   11:38 AM  Fall Risk   Falls in the past year? 1 0 0 0 0  Number falls in past yr: 0 0 0 0 0  Injury with Fall? 0 0 0 0 0  Risk for fall due to : Impaired balance/gait No Fall Risks No Fall Risks No Fall Risks No Fall Risks  Follow up Falls evaluation completed;Falls prevention discussed Falls evaluation completed Falls evaluation completed Falls evaluation completed Falls evaluation completed    MEDICARE RISK AT HOME:  Medicare Risk at Home - 11/26/22 0835     Any stairs in or around the home? No    If so, are there any without handrails? No    Home free of loose throw rugs in walkways, pet beds, electrical cords, etc? Yes    Adequate lighting in your home to reduce risk of falls? Yes    Life alert? No    Use of a cane, walker or w/c? No    Grab bars in the bathroom? Yes    Shower chair or bench in shower? No    Elevated toilet seat or a handicapped toilet? No              Cognitive Function:    04/08/2016   11:25 AM 04/09/2015   11:30 AM  MMSE - Mini Mental State Exam  Orientation to time 5 5  Orientation to Place 5 5  Registration 3 3  Attention/ Calculation 5 5  Recall 3 3  Language- name 2 objects 2 2  Language- repeat 1 1  Language- follow 3 step command 3 3  Language- read & follow direction 1 1  Write a sentence 1 1  Copy design 1 1   Total score 30 30        11/26/2022    8:21 AM  6CIT Screen  What Year? 0 points  What month? 0 points  What time? 0 points  Count back from 20 0 points  Months in reverse 0 points  Repeat phrase 2 points  Total Score 2 points    Immunizations Immunization History  Administered Date(s)  Administered   Fluad Quad(high Dose 65+) 02/01/2020, 01/28/2022   Influenza, High Dose Seasonal PF 01/27/2014, 01/19/2016, 01/26/2017, 02/17/2018, 12/28/2018   Influenza,inj,Quad PF,6+ Mos 01/30/2015   Influenza-Unspecified 02/02/2014, 01/30/2015, 01/26/2017   PFIZER(Purple Top)SARS-COV-2 Vaccination 05/24/2019, 06/20/2019   PNEUMOCOCCAL CONJUGATE-20 01/28/2022   Pneumococcal Conjugate-13 04/08/2016   Pneumococcal Polysaccharide-23 06/05/2012   Tdap 04/12/2013    TDAP status: Up to date  Flu Vaccine status: Up to date  Pneumococcal vaccine status: Up to date  Covid-19 vaccine status: Completed vaccines  Qualifies for Shingles Vaccine? Yes   Zostavax completed No   Shingrix Completed?: No.    Education has been provided regarding the importance of this vaccine. Patient has been advised to call insurance company to determine out of pocket expense if they have not yet received this vaccine. Advised may also receive vaccine at local pharmacy or Health Dept. Verbalized acceptance and understanding.  Screening Tests Health Maintenance  Topic Date Due   Zoster Vaccines- Shingrix (1 of 2) Never done   COVID-19 Vaccine (3 - Pfizer risk series) 07/18/2019   INFLUENZA VACCINE  11/11/2022   DTaP/Tdap/Td (2 - Td or Tdap) 04/13/2023   Medicare Annual Wellness (AWV)  11/26/2023   Pneumonia Vaccine 55+ Years old  Completed   Hepatitis C Screening  Completed   HPV VACCINES  Aged Out   Colonoscopy  Discontinued    Health Maintenance  Health Maintenance Due  Topic Date Due   Zoster Vaccines- Shingrix (1 of 2) Never done   COVID-19 Vaccine (3 - Pfizer risk series) 07/18/2019   INFLUENZA  VACCINE  11/11/2022    Colorectal cancer screening: No longer required.   Lung Cancer Screening: (Low Dose CT Chest recommended if Age 43-80 years, 20 pack-year currently smoking OR have quit w/in 15years.) does not qualify.   Additional Screening:  Hepatitis C Screening: does qualify; Completed 12/16  Vision Screening: Recommended annual ophthalmology exams for early detection of glaucoma and other disorders of the eye. Is the patient up to date with their annual eye exam?  Yes  Who is the provider or what is the name of the office in which the patient attends annual eye exams? [facility] If pt is not established with a provider, would they like to be referred to a provider to establish care? No .   Dental Screening: Recommended annual dental exams for proper oral hygiene   Community Resource Referral / Chronic Care Management: CRR required this visit?  No   CCM required this visit?  No     Plan:     I have personally reviewed and noted the following in the patient's chart:   Medical and social history Use of alcohol, tobacco or illicit drugs  Current medications and supplements including opioid prescriptions. Patient is not currently taking opioid prescriptions. Functional ability and status Nutritional status Physical activity Advanced directives List of other physicians Hospitalizations, surgeries, and ER visits in previous 12 months Vitals Screenings to include cognitive, depression, and falls Referrals and appointments  In addition, I have reviewed and discussed with patient certain preventive protocols, quality metrics, and best practice recommendations. A written personalized care plan for preventive services as well as general preventive health recommendations were provided to patient.     Sydell Axon, LPN   1/61/0960   After Visit Summary: (MyChart) Due to this being a telephonic visit, the after visit summary with patients personalized plan was  offered to patient via MyChart   Nurse Notes: None

## 2022-11-26 NOTE — Patient Instructions (Signed)
Benjamin Mills , Thank you for taking time to come for your Medicare Wellness Visit. I appreciate your ongoing commitment to your health goals. Please review the following plan we discussed and let me know if I can assist you in the future.   Referrals/Orders/Follow-Ups/Clinician Recommendations: None  This is a list of the screening recommended for you and due dates:  Health Maintenance  Topic Date Due   Zoster (Shingles) Vaccine (1 of 2) Never done   COVID-19 Vaccine (3 - Pfizer risk series) 07/18/2019   Flu Shot  11/11/2022   DTaP/Tdap/Td vaccine (2 - Td or Tdap) 04/13/2023   Medicare Annual Wellness Visit  11/26/2023   Pneumonia Vaccine  Completed   Hepatitis C Screening  Completed   HPV Vaccine  Aged Out   Colon Cancer Screening  Discontinued    Advanced directives: (Declined) Advance directive discussed with you today. Even though you declined this today, please call our office should you change your mind, and we can give you the proper paperwork for you to fill out. Will pick paperwork up at the office  Next Medicare Annual Wellness Visit scheduled for next year: Yes 11/29/23 @ 9:00  Preventive Care 65 Years and Older, Male  Preventive care refers to lifestyle choices and visits with your health care provider that can promote health and wellness. What does preventive care include? A yearly physical exam. This is also called an annual well check. Dental exams once or twice a year. Routine eye exams. Ask your health care provider how often you should have your eyes checked. Personal lifestyle choices, including: Daily care of your teeth and gums. Regular physical activity. Eating a healthy diet. Avoiding tobacco and drug use. Limiting alcohol use. Practicing safe sex. Taking low doses of aspirin every day. Taking vitamin and mineral supplements as recommended by your health care provider. What happens during an annual well check? The services and screenings done by your  health care provider during your annual well check will depend on your age, overall health, lifestyle risk factors, and family history of disease. Counseling  Your health care provider may ask you questions about your: Alcohol use. Tobacco use. Drug use. Emotional well-being. Home and relationship well-being. Sexual activity. Eating habits. History of falls. Memory and ability to understand (cognition). Work and work Astronomer. Screening  You may have the following tests or measurements: Height, weight, and BMI. Blood pressure. Lipid and cholesterol levels. These may be checked every 5 years, or more frequently if you are over 24 years old. Skin check. Lung cancer screening. You may have this screening every year starting at age 74 if you have a 30-pack-year history of smoking and currently smoke or have quit within the past 15 years. Fecal occult blood test (FOBT) of the stool. You may have this test every year starting at age 3. Flexible sigmoidoscopy or colonoscopy. You may have a sigmoidoscopy every 5 years or a colonoscopy every 10 years starting at age 23. Prostate cancer screening. Recommendations will vary depending on your family history and other risks. Hepatitis C blood test. Hepatitis B blood test. Sexually transmitted disease (STD) testing. Diabetes screening. This is done by checking your blood sugar (glucose) after you have not eaten for a while (fasting). You may have this done every 1-3 years. Abdominal aortic aneurysm (AAA) screening. You may need this if you are a current or former smoker. Osteoporosis. You may be screened starting at age 34 if you are at high risk. Talk with your health  care provider about your test results, treatment options, and if necessary, the need for more tests. Vaccines  Your health care provider may recommend certain vaccines, such as: Influenza vaccine. This is recommended every year. Tetanus, diphtheria, and acellular pertussis  (Tdap, Td) vaccine. You may need a Td booster every 10 years. Zoster vaccine. You may need this after age 32. Pneumococcal 13-valent conjugate (PCV13) vaccine. One dose is recommended after age 26. Pneumococcal polysaccharide (PPSV23) vaccine. One dose is recommended after age 41. Talk to your health care provider about which screenings and vaccines you need and how often you need them. This information is not intended to replace advice given to you by your health care provider. Make sure you discuss any questions you have with your health care provider. Document Released: 04/25/2015 Document Revised: 12/17/2015 Document Reviewed: 01/28/2015 Elsevier Interactive Patient Education  2017 [employer].  Fall Prevention in the Home Falls can cause injuries. They can happen to people of all ages. There are many things you can do to make your home safe and to help prevent falls. What can I do on the outside of my home? Regularly fix the edges of walkways and driveways and fix any cracks. Remove anything that might make you trip as you walk through a door, such as a raised step or threshold. Trim any bushes or trees on the path to your home. Use bright outdoor lighting. Clear any walking paths of anything that might make someone trip, such as rocks or tools. Regularly check to see if handrails are loose or broken. Make sure that both sides of any steps have handrails. Any raised decks and porches should have guardrails on the edges. Have any leaves, snow, or ice cleared regularly. Use sand or salt on walking paths during winter. Clean up any spills in your garage right away. This includes oil or grease spills. What can I do in the bathroom? Use night lights. Install grab bars by the toilet and in the tub and shower. Do not use towel bars as grab bars. Use non-skid mats or decals in the tub or shower. If you need to sit down in the shower, use a plastic, non-slip stool. Keep the floor dry. Clean  up any water that spills on the floor as soon as it happens. Remove soap buildup in the tub or shower regularly. Attach bath mats securely with double-sided non-slip rug tape. Do not have throw rugs and other things on the floor that can make you trip. What can I do in the bedroom? Use night lights. Make sure that you have a light by your bed that is easy to reach. Do not use any sheets or blankets that are too big for your bed. They should not hang down onto the floor. Have a firm chair that has side arms. You can use this for support while you get dressed. Do not have throw rugs and other things on the floor that can make you trip. What can I do in the kitchen? Clean up any spills right away. Avoid walking on wet floors. Keep items that you use a lot in easy-to-reach places. If you need to reach something above you, use a strong step stool that has a grab bar. Keep electrical cords out of the way. Do not use floor polish or wax that makes floors slippery. If you must use wax, use non-skid floor wax. Do not have throw rugs and other things on the floor that can make you trip. What can  I do with my stairs? Do not leave any items on the stairs. Make sure that there are handrails on both sides of the stairs and use them. Fix handrails that are broken or loose. Make sure that handrails are as long as the stairways. Check any carpeting to make sure that it is firmly attached to the stairs. Fix any carpet that is loose or worn. Avoid having throw rugs at the top or bottom of the stairs. If you do have throw rugs, attach them to the floor with carpet tape. Make sure that you have a light switch at the top of the stairs and the bottom of the stairs. If you do not have them, ask someone to add them for you. What else can I do to help prevent falls? Wear shoes that: Do not have high heels. Have rubber bottoms. Are comfortable and fit you well. Are closed at the toe. Do not wear sandals. If you  use a stepladder: Make sure that it is fully opened. Do not climb a closed stepladder. Make sure that both sides of the stepladder are locked into place. Ask someone to hold it for you, if possible. Clearly mark and make sure that you can see: Any grab bars or handrails. First and last steps. Where the edge of each step is. Use tools that help you move around (mobility aids) if they are needed. These include: Canes. Walkers. Scooters. Crutches. Turn on the lights when you go into a dark area. Replace any light bulbs as soon as they burn out. Set up your furniture so you have a clear path. Avoid moving your furniture around. If any of your floors are uneven, fix them. If there are any pets around you, be aware of where they are. Review your medicines with your doctor. Some medicines can make you feel dizzy. This can increase your chance of falling. Ask your doctor what other things that you can do to help prevent falls. This information is not intended to replace advice given to you by your health care provider. Make sure you discuss any questions you have with your health care provider. Document Released: 01/23/2009 Document Revised: 09/04/2015 Document Reviewed: 05/03/2014 Elsevier Interactive Patient Education  2017 [employer].

## 2022-12-14 ENCOUNTER — Other Ambulatory Visit: Payer: PPO

## 2022-12-14 DIAGNOSIS — C61 Malignant neoplasm of prostate: Secondary | ICD-10-CM

## 2022-12-15 LAB — PSA: Prostate Specific Ag, Serum: <0.1 ng/mL (ref 0.0–4.0)

## 2022-12-21 ENCOUNTER — Encounter: Payer: Self-pay | Admitting: Family Medicine

## 2022-12-21 ENCOUNTER — Ambulatory Visit: Payer: PPO | Admitting: Urology

## 2022-12-21 VITALS — BP 139/88 | HR 56 | Ht 68.0 in | Wt 194.0 lb

## 2022-12-21 DIAGNOSIS — N5231 Erectile dysfunction following radical prostatectomy: Secondary | ICD-10-CM | POA: Diagnosis not present

## 2022-12-21 DIAGNOSIS — N393 Stress incontinence (female) (male): Secondary | ICD-10-CM | POA: Diagnosis not present

## 2022-12-21 DIAGNOSIS — R3129 Other microscopic hematuria: Secondary | ICD-10-CM

## 2022-12-21 DIAGNOSIS — G250 Essential tremor: Secondary | ICD-10-CM

## 2022-12-21 DIAGNOSIS — C61 Malignant neoplasm of prostate: Secondary | ICD-10-CM | POA: Diagnosis not present

## 2022-12-21 NOTE — Progress Notes (Signed)
I,Amy L Pierron,acting as a scribe for Vanna Scotland, MD.,have documented all relevant documentation on the behalf of Vanna Scotland, MD,as directed by  Vanna Scotland, MD while in the presence of Vanna Scotland, MD.  12/21/2022 9:21 AM   Benjamin Mills, Benjamin Mills [id number]  Referring provider: McLean-Scocuzza, Pasty Spillers, MD No address on file  Chief Complaint  Patient presents with   Prostate Cancer    HPI: 77 year-old male with a personal history of prostate cancer presents today for annual follow-up.  He was diagnosed with prostate cancer and rising PSA in 2007 at [facility] in [place]. He underwent laparoscopic radical prostatectomy. His pathology showed Gleason 3+4, [id number]. He developed evidence of biochemical recurrence with negative staging in 2/207 with a rise in his PSA of 0.2. He underwent salvage radiation with Dr. Rushie Chestnut in 4.2017 and started on Lupron x1 year.   He also has a personal history of stress incontinence and underwent mid urethral sling placement in 2014.  His PSA remains undetectable as of 12/14/2022.   He is doing well overall besides some leakage, especially if he coughs. He hasn't tried pelvic floor exercises.   He has not seen any blood in his urine and does not need a refill of Sildenafil.   PMH: Past Medical History:  Diagnosis Date   Abnormal MRI, shoulder 04/08/2020   Adjustment disorder with mixed anxiety and depressed mood 08/06/2014   Amputation of finger, left    5th finger    Arthralgia 02/17/2018   Arthritis    hands, back lumbar sacral    Benign skin lesion 11/05/2015   Bilateral carotid artery stenosis 07/31/2019   Bilateral impacted cerumen 01/30/2019   Bunion of great toe of left foot 08/16/2017   Cancer (HCC) 2006   Prostate cancer with resection radical prostate removal. Urologist Dr. Vanna Scotland locally; prostate ca dx'ed in 2007 and 2015    Carotid artery stenosis    07/26/16 US carotid b/l 1-39%    Cataract     s/p surgery b/l Dr. Lindi [facility] 2018   Depression    Disorder of left rotator cuff 04/08/2020   Diverticulosis    Erectile dysfunction    Erectile dysfunction following radical prostatectomy 12/10/2014   Gastric ulcer    GERD (gastroesophageal reflux disease)    occ no meds   H/O cold sores 06/05/2014   Hepatic steatosis    History of kidney stones    h/o   History of prostate cancer 06/05/2014   History of splenectomy    Hyperlipidemia    Kidney stone on right side 02/17/2018   Left foot pain 10/17/2020   Nontraumatic complete tear of left rotator cuff 02/15/2020   Osteoarthritis of left shoulder 02/01/2020   Primary localized osteoarthrosis, hand 02/26/2019   Primary osteoarthritis of left knee 05/11/2019   Rupture of left biceps tendon 10/17/2020   Seborrheic keratoses 08/16/2017   Spinal stenosis    Spinal stenosis at L4-L5 level    Spinal stenosis at L4-L5 level 08/16/2017   Status post total knee replacement using cement, left 05/05/2020   SUI (stress urinary incontinence), male 12/10/2014   Tendinitis of upper biceps tendon of left shoulder 02/15/2020   Thoracic aortic atherosclerosis (HCC)    Urinary stress incontinence, male    Wears dentures    full upper and lower    Surgical History: Past Surgical History:  Procedure Laterality Date   BUNIONECTOMY Left 06/16/2022   Procedure: BUNIONECTOMY Guinevere Scarlet;  Surgeon:  Gwyneth Revels, DPM;  Location: [facility];  Service: Podiatry;  Laterality: Left;   COLONOSCOPY     HAMMER TOE SURGERY Left 06/16/2022   Procedure: HAMMER TOE CORRECTION;  Surgeon: Gwyneth Revels, DPM;  Location: [facility];  Service: Podiatry;  Laterality: Left;   INCONTINENCE SURGERY     KNEE ARTHROSCOPY N/A 1969   meniscal tear. left    PROSTATECTOMY  2007   removal of prostate   REPAIR OF PERFORATED ULCER     2009   SPLENECTOMY  1953   after trauma, fell out tree as kid   TOTAL KNEE ARTHROPLASTY Left  04/29/2020   Procedure: TOTAL KNEE ARTHROPLASTY;  Surgeon: Christena Flake, MD;  Location: [facility];  Service: Orthopedics;  Laterality: Left;   WEIL OSTEOTOMY Left 06/16/2022   Procedure: WEIL OSTEOTOMY;  Surgeon: Gwyneth Revels, DPM;  Location: [facility];  Service: Podiatry;  Laterality: Left;    Home Medications:  Allergies as of 12/21/2022   No Known Allergies      Medication List        Accurate as of December 21, 2022  9:21 AM. If you have any questions, ask your nurse or doctor.          citalopram 10 MG tablet Commonly known as: CeleXA Take 1 tablet (10 mg total) by mouth daily.   DULoxetine 60 MG capsule Commonly known as: CYMBALTA Take 1 capsule (60 mg total) by mouth 2 (two) times daily.   multivitamin capsule Take 1 capsule by mouth daily.   propranolol 40 MG tablet Commonly known as: INDERAL Take 1 tablet (40 mg total) by mouth 2 (two) times daily.   valACYclovir 1000 MG tablet Commonly known as: VALTREX Take 1 tablet (1,000 mg total) by mouth 2 (two) times daily. X 3-7 days outbreak with food        Family History: Family History  Problem Relation Age of Onset   Dementia Mother    Glaucoma Mother    Alcohol abuse Father    Glaucoma Sister    Heart disease Brother        heart valve issue   Multiple sclerosis Brother    Kidney disease Neg Hx    Prostate cancer Neg Hx     Social History:  reports that he quit smoking about 44 years ago. His smoking use included cigarettes. He started smoking about 51 years ago. He has a 7 pack-year smoking history. He has never used smokeless tobacco. He reports current alcohol use of about 4.0 standard drinks of alcohol per week. He reports that he does not use drugs.   Physical Exam: BP 139/88   Pulse (!) 56   Ht 5\' 8"  (1.727 m)   Wt 194 lb (88 kg)   BMI 29.50 kg/m   Constitutional:  Alert and oriented, No acute distress. HEENT: [place] AT, moist mucus membranes.  Trachea midline, no  masses. Neurologic: Grossly intact, no focal deficits, moving all 4 extremities. Psychiatric: Normal mood and affect.   Assessment & Plan:    Prostate cancer  - No evidence of disease. Continue checking PSA annually. Offered to have him follow up with his primary care given he has been very stable, but elected to follow here.  2. Stress incontinence  - Minimal leakage with straining / sneezing. Encouraged pelvic floor exercises to strengthen muscles. Gave information to read regarding this.   3. History of microscopic hematuria  - Plan to check a urinalysis next year.  4.  Erectile Dysfunction  - Declined to discuss today.   Return in about 1 year (around 12/21/2023) for PSA, UA.  I have reviewed the above documentation for accuracy and completeness, and I agree with the above.   Vanna Scotland, MD    [facility] [address] [place], [place] [address] [phone]

## 2022-12-23 MED ORDER — PROPRANOLOL HCL 40 MG PO TABS: 40.0000 mg | ORAL_TABLET | Freq: Two times a day (BID) | ORAL | 1 refills | Status: DC

## 2023-01-02 ENCOUNTER — Encounter: Payer: Self-pay | Admitting: Family Medicine

## 2023-01-03 ENCOUNTER — Other Ambulatory Visit: Payer: Self-pay | Admitting: Family Medicine

## 2023-01-03 ENCOUNTER — Other Ambulatory Visit: Payer: Self-pay | Admitting: Surgery

## 2023-01-03 ENCOUNTER — Ambulatory Visit
Admission: RE | Admit: 2023-01-03 | Discharge: 2023-01-03 | Disposition: A | Discharge status: Home / Self Care | Payer: PPO | Source: Ambulatory Visit | Attending: Surgery | Admitting: Surgery

## 2023-01-03 DIAGNOSIS — M79605 Pain in left leg: Secondary | ICD-10-CM | POA: Diagnosis not present

## 2023-01-03 DIAGNOSIS — R600 Localized edema: Secondary | ICD-10-CM | POA: Diagnosis not present

## 2023-01-03 DIAGNOSIS — M7989 Other specified soft tissue disorders: Secondary | ICD-10-CM | POA: Insufficient documentation

## 2023-01-03 DIAGNOSIS — S86812A Strain of other muscle(s) and tendon(s) at lower leg level, left leg, initial encounter: Secondary | ICD-10-CM | POA: Diagnosis not present

## 2023-01-03 DIAGNOSIS — Z96652 Presence of left artificial knee joint: Secondary | ICD-10-CM | POA: Diagnosis not present

## 2023-01-03 DIAGNOSIS — B009 Herpesviral infection, unspecified: Secondary | ICD-10-CM

## 2023-01-03 MED ORDER — VALACYCLOVIR HCL 1 G PO TABS: 1000.0000 mg | ORAL_TABLET | Freq: Two times a day (BID) | ORAL | 3 refills | Status: DC

## 2023-01-23 ENCOUNTER — Emergency Department: Payer: PPO

## 2023-01-23 ENCOUNTER — Encounter: Payer: Self-pay | Admitting: Family Medicine

## 2023-01-23 ENCOUNTER — Emergency Department
Admission: EM | Admit: 2023-01-23 | Discharge: 2023-01-23 | Disposition: A | Discharge status: Home / Self Care | Payer: PPO | Attending: Emergency Medicine | Admitting: Emergency Medicine

## 2023-01-23 ENCOUNTER — Other Ambulatory Visit: Payer: Self-pay

## 2023-01-23 DIAGNOSIS — W19XXXA Unspecified fall, initial encounter: Secondary | ICD-10-CM

## 2023-01-23 DIAGNOSIS — R42 Dizziness and giddiness: Secondary | ICD-10-CM | POA: Diagnosis not present

## 2023-01-23 DIAGNOSIS — W01198A Fall on same level from slipping, tripping and stumbling with subsequent striking against other object, initial encounter: Secondary | ICD-10-CM | POA: Insufficient documentation

## 2023-01-23 DIAGNOSIS — R590 Localized enlarged lymph nodes: Secondary | ICD-10-CM | POA: Diagnosis not present

## 2023-01-23 DIAGNOSIS — S299XXA Unspecified injury of thorax, initial encounter: Secondary | ICD-10-CM | POA: Diagnosis present

## 2023-01-23 DIAGNOSIS — K449 Diaphragmatic hernia without obstruction or gangrene: Secondary | ICD-10-CM | POA: Diagnosis not present

## 2023-01-23 DIAGNOSIS — S22088A Other fracture of T11-T12 vertebra, initial encounter for closed fracture: Secondary | ICD-10-CM | POA: Insufficient documentation

## 2023-01-23 DIAGNOSIS — S069X9A Unspecified intracranial injury with loss of consciousness of unspecified duration, initial encounter: Secondary | ICD-10-CM | POA: Diagnosis not present

## 2023-01-23 DIAGNOSIS — R0602 Shortness of breath: Secondary | ICD-10-CM | POA: Diagnosis not present

## 2023-01-23 DIAGNOSIS — M5030 Other cervical disc degeneration, unspecified cervical region: Secondary | ICD-10-CM | POA: Diagnosis not present

## 2023-01-23 DIAGNOSIS — M51369 Other intervertebral disc degeneration, lumbar region without mention of lumbar back pain or lower extremity pain: Secondary | ICD-10-CM | POA: Diagnosis not present

## 2023-01-23 DIAGNOSIS — N281 Cyst of kidney, acquired: Secondary | ICD-10-CM | POA: Diagnosis not present

## 2023-01-23 DIAGNOSIS — Z1152 Encounter for screening for COVID-19: Secondary | ICD-10-CM | POA: Diagnosis not present

## 2023-01-23 DIAGNOSIS — S22080A Wedge compression fracture of T11-T12 vertebra, initial encounter for closed fracture: Secondary | ICD-10-CM

## 2023-01-23 DIAGNOSIS — R791 Abnormal coagulation profile: Secondary | ICD-10-CM | POA: Insufficient documentation

## 2023-01-23 DIAGNOSIS — S22089A Unspecified fracture of T11-T12 vertebra, initial encounter for closed fracture: Secondary | ICD-10-CM | POA: Diagnosis not present

## 2023-01-23 DIAGNOSIS — R5383 Other fatigue: Secondary | ICD-10-CM | POA: Insufficient documentation

## 2023-01-23 DIAGNOSIS — R9431 Abnormal electrocardiogram [ECG] [EKG]: Secondary | ICD-10-CM | POA: Diagnosis not present

## 2023-01-23 DIAGNOSIS — Z20822 Contact with and (suspected) exposure to covid-19: Secondary | ICD-10-CM | POA: Insufficient documentation

## 2023-01-23 DIAGNOSIS — M4802 Spinal stenosis, cervical region: Secondary | ICD-10-CM | POA: Diagnosis not present

## 2023-01-23 DIAGNOSIS — R7989 Other specified abnormal findings of blood chemistry: Secondary | ICD-10-CM | POA: Diagnosis not present

## 2023-01-23 DIAGNOSIS — S0990XA Unspecified injury of head, initial encounter: Secondary | ICD-10-CM | POA: Diagnosis present

## 2023-01-23 DIAGNOSIS — I700 Atherosclerosis of aorta: Secondary | ICD-10-CM | POA: Insufficient documentation

## 2023-01-23 DIAGNOSIS — Z043 Encounter for examination and observation following other accident: Secondary | ICD-10-CM | POA: Diagnosis not present

## 2023-01-23 DIAGNOSIS — M48061 Spinal stenosis, lumbar region without neurogenic claudication: Secondary | ICD-10-CM | POA: Diagnosis not present

## 2023-01-23 LAB — TROPONIN I (HIGH SENSITIVITY)
Troponin I (High Sensitivity): 5 ng/L (ref <18)
Troponin I (High Sensitivity): 4 ng/L (ref <18)

## 2023-01-23 LAB — COMPREHENSIVE METABOLIC PANEL
ALT: 22 U/L (ref 0–44)
AST: 23 U/L (ref 15–41)
Albumin: 3.9 g/dL (ref 3.5–5.0)
Alkaline Phosphatase: 100 U/L (ref 38–126)
Anion gap: 9 (ref 5–15)
BUN: 23 mg/dL (ref 8–23)
CO2: 25 mmol/L (ref 22–32)
Calcium: 9.4 mg/dL (ref 8.9–10.3)
Chloride: 101 mmol/L (ref 98–111)
Creatinine, Ser: 0.98 mg/dL (ref 0.61–1.24)
GFR, Estimated: >60 mL/min (ref >60)
Glucose, Bld: 120 mg/dL — ABNORMAL HIGH (ref 70–99)
Potassium: 4.5 mmol/L (ref 3.5–5.1)
Sodium: 135 mmol/L (ref 135–145)
Total Bilirubin: 1.2 mg/dL (ref 0.3–1.2)
Total Protein: 7.1 g/dL (ref 6.5–8.1)

## 2023-01-23 LAB — CBC
HCT: 42.1 % (ref 39.0–52.0)
Hemoglobin: 14.1 g/dL (ref 13.0–17.0)
MCH: 31.9 pg (ref 26.0–34.0)
MCHC: 33.5 g/dL (ref 30.0–36.0)
MCV: 95.2 fL (ref 80.0–100.0)
Platelets: 253 10*3/uL (ref 150–400)
RBC: 4.42 MIL/uL (ref 4.22–5.81)
RDW: 12.5 % (ref 11.5–15.5)
WBC: 9.5 10*3/uL (ref 4.0–10.5)
nRBC: 0 % (ref 0.0–0.2)

## 2023-01-23 LAB — RESP PANEL BY RT-PCR (RSV, FLU A&B, COVID)  RVPGX2
Influenza A by PCR: NEGATIVE
Influenza B by PCR: NEGATIVE
Resp Syncytial Virus by PCR: NEGATIVE
SARS Coronavirus 2 by RT PCR: NEGATIVE

## 2023-01-23 LAB — D-DIMER, QUANTITATIVE: D-Dimer, Quant: 1.97 ug{FEU}/mL — ABNORMAL HIGH (ref 0.00–0.50)

## 2023-01-23 MED ORDER — IOHEXOL 350 MG/ML SOLN
75.0000 mL | Freq: Once | INTRAVENOUS | Status: AC | PRN
  Administered 2023-01-23: 75 mL via INTRAVENOUS

## 2023-01-23 NOTE — ED Provider Notes (Signed)
[facility] Provider Note    Event Date/Time   First MD Initiated Contact with Patient 01/23/23 [phone]     (approximate)   History   Shortness of Breath   HPI Benjamin Mills is a 77 y.o. male presenting today for shortness of breath.  Patient states that since early September he has had intermittent shortness of breath.  He is also noted fatigue over this time.  Today he stated having an episode where he was up on his feet and also and felt like his legs gave out and he went down.  He denies loss of consciousness.  Notes pain to the back of his head, neck, upper back, and lower back.  Denies pain elsewhere on his extremities, front of chest, or abdomen.  Denies any recent fevers, chills, cough, nausea, vomiting, chest pain, palpitations.  No prior episodes of syncope or loss of consciousness.  Not on blood thinners.  Per chart review: Patient was seen on 01/03/2023 for left calf pain.  X-rays were negative at that time.  Then had outpatient ultrasound of the left lower extremity on the same day which showed no evidence of a DVT.     Physical Exam   Triage Vital Signs: ED Triage Vitals  Encounter Vitals Group     BP 01/23/23 0848 129/83     Systolic BP Percentile --      Diastolic BP Percentile --      Pulse Rate 01/23/23 0848 74     Resp 01/23/23 0848 17     Temp 01/23/23 0848 98.3 F (36.8 C)     Temp Source 01/23/23 0848 Oral     SpO2 01/23/23 0848 99 %     Weight 01/23/23 0849 194 lb 0.1 oz (88 kg)     Height 01/23/23 0849 5\' 8"  (1.727 m)     Head Circumference --      Peak Flow --      Pain Score 01/23/23 0849 7     Pain Loc --      Pain Education --      Exclude from Growth Chart --     Most recent vital signs: Vitals:   01/23/23 1030 01/23/23 1145  BP:    Pulse: 64 61  Resp: 16 15  Temp:    SpO2: 100% 100%   Physical Exam: I have reviewed the vital signs and nursing notes. General: Awake, alert, no acute distress.  Nontoxic  appearing. Head:  Atraumatic, normocephalic.   ENT:  EOM intact, PERRL. Oral mucosa is pink and moist with no lesions. Neck: Neck is supple with full range of motion, No meningeal signs. Cardiovascular:  RRR, No murmurs. Peripheral pulses palpable and equal bilaterally. Respiratory:  Symmetrical chest wall expansion.  No rhonchi, rales, or wheezes.  Good air movement throughout.  No use of accessory muscles.   Musculoskeletal:  No cyanosis or edema. Moving extremities with full ROM.  Mild tenderness palpation of C-spine.  Mild tenderness palpation to left upper thoracic region on the back, L-spine tenderness palpation. Abdomen:  Soft, nontender, nondistended. Neuro:  GCS 15, moving all four extremities, interacting appropriately. Speech clear. Psych:  Calm, appropriate.   Skin:  Warm, dry, no rash.    ED Results / Procedures / Treatments   Labs (all labs ordered are listed, but only abnormal results are displayed) Labs Reviewed  COMPREHENSIVE METABOLIC PANEL - Abnormal; Notable for the following components:      Result Value   Glucose,  Bld 120 (*)    All other components within normal limits  D-DIMER, QUANTITATIVE - Abnormal; Notable for the following components:   D-Dimer, Quant 1.97 (*)    All other components within normal limits  RESP PANEL BY RT-PCR (RSV, FLU A&B, COVID)  RVPGX2  CBC  TROPONIN I (HIGH SENSITIVITY)  TROPONIN I (HIGH SENSITIVITY)     EKG My EKG interpretation: Right bundle branch block.  Rate of 76.  Normal sinus rhythm.  No acute ST elevations or depressions.  Right bundle branch block was present on EKG on 04/21/2020.   RADIOLOGY Independently interpreted CT imaging and agree with radiology read   PROCEDURES:  Critical Care performed: No  Procedures   MEDICATIONS ORDERED IN ED: Medications  iohexol (OMNIPAQUE) 350 MG/ML injection 75 mL (75 mLs Intravenous Contrast Given 01/23/23 1203)     IMPRESSION / MDM / ASSESSMENT AND PLAN / ED COURSE  I  reviewed the triage vital signs and the nursing notes.                              Differential diagnosis includes, but is not limited to, ICH, cervical spine injury, thoracic spine injury, lumbar spine injury, rib fractures, orthostatic hypotension, dehydration, cardiac arrhythmia.  Patient's presentation is most consistent with acute complicated illness / injury requiring diagnostic workup.  Patient is a 77 year old male presenting today for lightheaded episode associated with a fall.  Reportedly been weak and short of breath over the past 6 weeks.  Vital signs and physical exam are stable and unremarkable on arrival here.  Given fall with bony tenderness, further imaging was performed to rule out traumatic pathology.  This was notable for possible acute T12 compression fracture.  No other acute neurological symptoms below the level of the injury.  Discussed case with neurosurgery who recommends TLSO brace and follow-up outpatient.  Separately, evaluated lightheaded spells.  EKG unremarkable.  Troponins negative x 2.  CBC and CMP largely reassuring.  Negative viral panel.  Patient did have an elevated D-dimer and had a recent outpatient evaluation for potential blood clots.  CTA was ordered for further evaluation.  This shows no evidence of a PE.  Patient was reassessed with no ongoing symptoms.  Prefers to go home at this time.  TLSO brace was obtained and patient was safe for discharge with outpatient follow-up for continued evaluation by his PCP and neurosurgery.  Given strict return precautions for new syncopal episodes.  The patient is on the cardiac monitor to evaluate for evidence of arrhythmia and/or significant heart rate changes. Clinical Course as of 01/23/23 1418  Sun Jan 23, 2023  1107 D-Dimer, Quant(!): 1.97 Will follow up with CTA chest [DW]  1129 Spoke with neurosurgery who recommends likely TLSO brace.  He will review images and update me on final plan. [DW]    Clinical Course  User Index [DW] Janith Lima, MD     FINAL CLINICAL IMPRESSION(S) / ED DIAGNOSES   Final diagnoses:  Compression fracture of T12 vertebra, initial encounter [facility])  Fall, initial encounter     Rx / DC Orders   ED Discharge Orders     None        Note:  This document was prepared using Dragon voice recognition software and may include unintentional dictation errors.   Janith Lima, MD 01/23/23 [phone]

## 2023-01-23 NOTE — Discharge Instructions (Signed)
Please follow-up with your primary care provider within the next week for further evaluation of potential syncopal episode.  You may discussed with him things like ultrasound of your heart called an echocardiogram or ultrasounds of your neck to look at the arteries.  Please wear your brace at all times when ambulating to help for protection of your back.  Please follow-up with neurosurgery outpatient for ongoing management.

## 2023-01-23 NOTE — Progress Notes (Signed)
Orthopedic Tech Progress Note Patient Details:  Benjamin Mills 04/06/1946 [id number]  Order for a TLSO brace called into [facility]. Pt is being discharged home.  Patient ID: Benjamin Mills, male   DOB: [birth year], 77 y.o.   MRN: [id number]  Docia Furl 01/23/2023, 12:26 PM

## 2023-01-23 NOTE — ED Triage Notes (Signed)
Pt here with SOB. Pt states he had a episode of LOC last night and hit his head, pt is not on a blood thinner. Pt states he has not felt well for the past couple of days. Wife states pt has been fatigued more than normal lately. Pt also states he was evaluated for a blood clot in his left leg that was negative but he was concerned about his calf having bruising as well. PT c/o lower back pain from the fall.

## 2023-01-23 NOTE — ED Notes (Signed)
Secretary notified of patient needing a TLSO brace placed

## 2023-01-24 ENCOUNTER — Encounter: Payer: Self-pay | Admitting: Family Medicine

## 2023-01-24 NOTE — Telephone Encounter (Signed)
Patients wife called and is very concerned and would like provider to give her a call.

## 2023-01-25 ENCOUNTER — Ambulatory Visit (INDEPENDENT_AMBULATORY_CARE_PROVIDER_SITE_OTHER): Payer: PPO | Admitting: Family Medicine

## 2023-01-25 ENCOUNTER — Encounter: Payer: Self-pay | Admitting: Family Medicine

## 2023-01-25 VITALS — BP 118/82 | HR 106 | Temp 97.9°F | Resp 16 | Ht 68.0 in | Wt 191.1 lb

## 2023-01-25 DIAGNOSIS — S22080A Wedge compression fracture of T11-T12 vertebra, initial encounter for closed fracture: Secondary | ICD-10-CM

## 2023-01-25 DIAGNOSIS — G250 Essential tremor: Secondary | ICD-10-CM | POA: Diagnosis not present

## 2023-01-25 DIAGNOSIS — R55 Syncope and collapse: Secondary | ICD-10-CM

## 2023-01-25 DIAGNOSIS — R0602 Shortness of breath: Secondary | ICD-10-CM

## 2023-01-25 MED ORDER — TRAMADOL HCL 50 MG PO TABS
25.0000 mg | ORAL_TABLET | Freq: Three times a day (TID) | ORAL | 0 refills | Status: AC | PRN
Start: 2023-01-25 — End: 2023-01-30

## 2023-01-25 MED ORDER — PROPRANOLOL HCL 40 MG PO TABS
40.0000 mg | ORAL_TABLET | Freq: Every day | ORAL | Status: DC
Start: 2023-01-25 — End: 2023-04-26

## 2023-01-25 NOTE — Patient Instructions (Addendum)
It was a pleasure meeting you today. Thank you for allowing me to take part in your health care.  Our goals for today as we discussed include:  Abd-El-Barr, Jerolyn Center, MD.  Specialty: Neurosurgery Why: Call for neurosurgery follow-up in the next 1 to 2 weeks. Contact information: [address] [place] [address] [phone]  Tramadol 25 mg as needed for pain  Can take Tylenol 650 mg three times a day for pain. Recommend using TSLO brace at all times until able to see Neurosurgery  Referral sent to cardiology ECHO and carotid dopplers ordered. They will call to schedule appointment.  Decreased Propranolol to 40 mg daily  Follow up as needed  If you have any questions or concerns, please do not hesitate to call the office at [phone].  I look forward to our next visit and until then take care and stay safe.  Regards,   Dana Allan, MD   [facility]

## 2023-01-25 NOTE — Progress Notes (Unsigned)
SUBJECTIVE:   Chief Complaint  Patient presents with   Follow-up   HPI Presents to clinic with wife for hospital follow up  Discussed the use of AI scribe software for clinical note transcription with the patient, who gave verbal consent to proceed.  History of Present Illness The patient, with a history of essential tremor managed with propranolol, presented with a recent fall and ongoing shortness of breath. The fall occurred suddenly, without preceding dizziness or other symptoms, and resulted in two episodes of collapse. The patient reported feeling as if an "off switch" had been pushed, leading to an immediate loss of body control. The fall resulted in a back injury, specifically a T12 compression fracture, diagnosed at a recent hospital visit.  In addition to the fall, the patient has been experiencing significant fatigue and a general feeling of unwellness for the past five to six weeks. He reports no chest pain, no changes in appetite, and no significant weight loss. However, he has noticed an increase in tiredness and a decrease in his usual energy levels.  The patient also reported a recent episode of knee swelling and bruising, which resolved on its own. A D-dimer test conducted during a recent hospital visit was positive, but subsequent CT scans did not reveal any blood clots.  The patient's essential tremor has been well-managed with propranolol, but he expressed a desire to reduce the dosage due to the ongoing fatigue. He has not seen a neurologist for the tremor as previously planned.  PERTINENT PMH / PSH: As above  OBJECTIVE:  BP 118/82   Pulse (!) 106   Temp 97.9 F (36.6 C)   Resp 16   Ht 5\' 8"  (1.727 m)   Wt 191 lb 2 oz (86.7 kg)   SpO2 98%   BMI 29.06 kg/m    Physical Exam Vitals reviewed.  Constitutional:      General: He is not in acute distress.    Appearance: Normal appearance. He is normal weight. He is not ill-appearing, toxic-appearing or  diaphoretic.  Eyes:     General:        Right eye: No discharge.        Left eye: No discharge.  Cardiovascular:     Rate and Rhythm: Regular rhythm. Tachycardia present.     Heart sounds: Normal heart sounds.  Pulmonary:     Effort: Pulmonary effort is normal.     Breath sounds: Normal breath sounds.  Abdominal:     General: Bowel sounds are normal.  Musculoskeletal:        General: Normal range of motion.     Cervical back: Normal range of motion.  Skin:    General: Skin is warm and dry.  Neurological:     Mental Status: He is alert and oriented to person, place, and time. Mental status is at baseline.  Psychiatric:        Mood and Affect: Mood normal.        Behavior: Behavior normal.        Thought Content: Thought content normal.        Judgment: Judgment normal.        01/25/2023    1:40 PM 11/26/2022    8:14 AM 10/12/2022    8:45 AM 06/15/2022    3:34 PM 06/02/2022   10:37 AM  Depression screen PHQ 2/9  Decreased Interest 0 0 0 1 1  Down, Depressed, Hopeless 0 1 0 1 0  PHQ - 2 Score 0  1 0 2 1  Altered sleeping 0 2 1 1 2   Tired, decreased energy 0 2 1 1 2   Change in appetite 0 0 0 0 1  Feeling bad or failure about yourself  0 0 0 1 1  Trouble concentrating 0 0 0 0 0  Moving slowly or fidgety/restless 0 0 0 0 0  Suicidal thoughts 0 0 0 0 0  PHQ-9 Score 0 5 2 5 7   Difficult doing work/chores Not difficult at all Somewhat difficult Not difficult at all Somewhat difficult Somewhat difficult      01/25/2023    1:40 PM 10/12/2022    8:46 AM 06/15/2022    3:35 PM 06/02/2022   10:38 AM  GAD 7 : Generalized Anxiety Score  Nervous, Anxious, on Edge 0 0 1 2  Control/stop worrying 0 0 0 1  Worry too much - different things 0 0 0 1  Trouble relaxing 0 0 0 1  Restless 0 0 1 0  Easily annoyed or irritable 0 1 1 3   Afraid - awful might happen 0 0 0 0  Total GAD 7 Score 0 1 3 8   Anxiety Difficulty Not difficult at all Not difficult at all Somewhat difficult Somewhat  difficult    ASSESSMENT/PLAN:  Compression fracture of T12 vertebra, initial encounter [facility]) Assessment & Plan: Recent fall with subsequent back pain. CT scan confirmed T12 compression fracture. Patient unable to tolerate brace due to shortness of breath. Severe, intermittent pain related to vertebral fracture. Current analgesia (Tylenol) not providing sufficient relief. -Tramadol 25 mg daily as needed for pain x 8 tabs. -Consider alternative or additional analgesics, balancing pain control with potential for respiratory depression. -Encourage use of brace as tolerated to prevent further damage. -Follow up with neurosurgery as soon as possible. Patient to call and schedule appointment with Abd-El-Barr, Jerolyn Center, MD   Orders: -     traMADol HCl; Take 0.5 tablets (25 mg total) by mouth every 8 (eight) hours as needed for up to 5 days.  Dispense: 8 tablet; Refill: 0  Syncope, unspecified syncope type Assessment & Plan: Two episodes of syncope associated with shortness of breath and fatigue. No preceding dizziness or other symptoms. No recurrence since the initial event. Recent ECG showing RBB, unchanged from  2022. -Decrease Propranolol  Orders: -     Ambulatory referral to Cardiology -     ECHOCARDIOGRAM COMPLETE; Future -     US Carotid Bilateral; Future  Essential tremor Assessment & Plan: Well controlled on Propranolol. Patient decided not to pursue rescheduling Neurology appointment. -Reduce Propranolol from 80mg  to 40mg  daily. Monitor for potential return of tremor symptoms.    Orders: -     Propranolol HCl; Take 1 tablet (40 mg total) by mouth daily.  Shortness of breath Assessment & Plan: Ongoing for several weeks, exacerbated by wearing back brace. No associated chest pain or wheezing. Euvolemic on exam -Refer to cardiology -Obtain ECHO         PDMP reviewed  Return if symptoms worsen or fail to improve, for PCP.  Dana Allan, MD

## 2023-01-26 ENCOUNTER — Encounter: Payer: Self-pay | Admitting: Family Medicine

## 2023-01-26 DIAGNOSIS — M25511 Pain in right shoulder: Secondary | ICD-10-CM | POA: Diagnosis not present

## 2023-01-26 DIAGNOSIS — M7581 Other shoulder lesions, right shoulder: Secondary | ICD-10-CM | POA: Diagnosis not present

## 2023-01-26 DIAGNOSIS — W010XXD Fall on same level from slipping, tripping and stumbling without subsequent striking against object, subsequent encounter: Secondary | ICD-10-CM | POA: Diagnosis not present

## 2023-01-26 DIAGNOSIS — M7531 Calcific tendinitis of right shoulder: Secondary | ICD-10-CM | POA: Diagnosis not present

## 2023-01-26 DIAGNOSIS — R0602 Shortness of breath: Secondary | ICD-10-CM | POA: Insufficient documentation

## 2023-01-26 DIAGNOSIS — S22080A Wedge compression fracture of T11-T12 vertebra, initial encounter for closed fracture: Secondary | ICD-10-CM | POA: Diagnosis not present

## 2023-01-26 NOTE — Assessment & Plan Note (Signed)
Ongoing for several weeks, exacerbated by wearing back brace. No associated chest pain or wheezing. Euvolemic on exam -Refer to cardiology -Obtain ECHO

## 2023-01-26 NOTE — Assessment & Plan Note (Addendum)
Well controlled on Propranolol. Patient decided not to pursue rescheduling Neurology appointment. -Reduce Propranolol from 80mg  to 40mg  daily. Monitor for potential return of tremor symptoms.

## 2023-01-26 NOTE — Assessment & Plan Note (Signed)
Recent fall with subsequent back pain. CT scan confirmed T12 compression fracture. Patient unable to tolerate brace due to shortness of breath. Severe, intermittent pain related to vertebral fracture. Current analgesia (Tylenol) not providing sufficient relief. -Tramadol 25 mg daily as needed for pain x 8 tabs. -Consider alternative or additional analgesics, balancing pain control with potential for respiratory depression. -Encourage use of brace as tolerated to prevent further damage. -Follow up with neurosurgery as soon as possible. Patient to call and schedule appointment with Abd-El-Barr, Jerolyn Center, MD

## 2023-01-26 NOTE — Assessment & Plan Note (Signed)
Two episodes of syncope associated with shortness of breath and fatigue. No preceding dizziness or other symptoms. No recurrence since the initial event. Recent ECG showing RBB, unchanged from  2022. -Decrease Propranolol

## 2023-01-27 ENCOUNTER — Encounter: Payer: Self-pay | Admitting: Family Medicine

## 2023-01-31 ENCOUNTER — Telehealth: Payer: Self-pay | Admitting: Family Medicine

## 2023-01-31 NOTE — Telephone Encounter (Signed)
[place] [facility] called regarding the active request. They stated which order was needed a Radiology or a vascular study. Please call [phone]

## 2023-02-01 NOTE — Telephone Encounter (Signed)
error 

## 2023-02-01 NOTE — Telephone Encounter (Signed)
Called and they had no idea why the call was made.

## 2023-02-06 ENCOUNTER — Other Ambulatory Visit: Payer: Self-pay | Admitting: Family Medicine

## 2023-02-06 DIAGNOSIS — F39 Unspecified mood [affective] disorder: Secondary | ICD-10-CM

## 2023-02-11 ENCOUNTER — Ambulatory Visit
Admission: RE | Admit: 2023-02-11 | Discharge: 2023-02-11 | Disposition: A | Discharge status: Home / Self Care | Payer: PPO | Source: Ambulatory Visit | Attending: Family Medicine

## 2023-02-11 ENCOUNTER — Ambulatory Visit: Payer: PPO | Attending: Family Medicine

## 2023-02-11 DIAGNOSIS — R55 Syncope and collapse: Secondary | ICD-10-CM | POA: Diagnosis not present

## 2023-02-11 DIAGNOSIS — I6523 Occlusion and stenosis of bilateral carotid arteries: Secondary | ICD-10-CM | POA: Diagnosis not present

## 2023-02-11 LAB — ECHOCARDIOGRAM COMPLETE
AR max vel: 3.1 cm2
AV Area VTI: 2.79 cm2
AV Area mean vel: 2.85 cm2
AV Mean grad: 2 mm[Hg]
AV Peak grad: 3.3 mm[Hg]
Ao pk vel: 0.9 m/s
Area-P 1/2: 3.32 cm2
Calc EF: 55.4 %
S' Lateral: 2.5 cm
Single Plane A2C EF: 52.5 %
Single Plane A4C EF: 56.1 %

## 2023-02-15 ENCOUNTER — Telehealth: Payer: Self-pay | Admitting: Family Medicine

## 2023-02-15 NOTE — Telephone Encounter (Signed)
Patient just called and wanted to know his results for his Artery test. He said he has not heard anything about it yet. His number is [phone].

## 2023-02-15 NOTE — Telephone Encounter (Signed)
Called and let pt know that the results have not been resulted yet.

## 2023-02-17 ENCOUNTER — Encounter: Payer: Self-pay | Admitting: Family Medicine

## 2023-02-18 ENCOUNTER — Other Ambulatory Visit: Payer: Self-pay

## 2023-02-18 NOTE — Telephone Encounter (Signed)
Called radiology and they are sending over stat to get read. I called and informed pt of this information. I did advised him that he might see the results before Dr. Clent Ridges does and we will give a call once we have the results.

## 2023-02-19 ENCOUNTER — Encounter: Payer: Self-pay | Admitting: Family Medicine

## 2023-02-24 ENCOUNTER — Telehealth: Payer: Self-pay

## 2023-02-24 NOTE — Telephone Encounter (Signed)
Transition Care Management Unsuccessful Follow-up Telephone Call  Date of discharge and from where:  01/23/2023 [facility]  Attempts:  1st Attempt  Reason for unsuccessful TCM follow-up call:  Unable to reach patient  Abdikadir Fohl [facility]  [facility], [facility] Guide Direct Dial: [phone]  Website: [email or web address]

## 2023-03-07 DIAGNOSIS — M7581 Other shoulder lesions, right shoulder: Secondary | ICD-10-CM | POA: Diagnosis not present

## 2023-03-07 DIAGNOSIS — S22080S Wedge compression fracture of T11-T12 vertebra, sequela: Secondary | ICD-10-CM | POA: Diagnosis not present

## 2023-03-07 DIAGNOSIS — M546 Pain in thoracic spine: Secondary | ICD-10-CM | POA: Diagnosis not present

## 2023-03-07 DIAGNOSIS — M7531 Calcific tendinitis of right shoulder: Secondary | ICD-10-CM | POA: Diagnosis not present

## 2023-04-11 ENCOUNTER — Ambulatory Visit: Payer: PPO

## 2023-04-11 ENCOUNTER — Ambulatory Visit: Payer: PPO | Attending: Cardiology | Admitting: Cardiology

## 2023-04-11 ENCOUNTER — Encounter: Payer: Self-pay | Admitting: Cardiology

## 2023-04-11 VITALS — BP 128/80 | HR 55 | Ht 68.0 in | Wt 190.8 lb

## 2023-04-11 DIAGNOSIS — R55 Syncope and collapse: Secondary | ICD-10-CM

## 2023-04-11 DIAGNOSIS — E7800 Pure hypercholesterolemia, unspecified: Secondary | ICD-10-CM | POA: Diagnosis not present

## 2023-04-11 DIAGNOSIS — I77810 Thoracic aortic ectasia: Secondary | ICD-10-CM | POA: Diagnosis not present

## 2023-04-11 MED ORDER — ASPIRIN 81 MG PO TBEC: 81.0000 mg | DELAYED_RELEASE_TABLET | Freq: Every day | ORAL | Status: AC

## 2023-04-11 MED ORDER — ATORVASTATIN CALCIUM 40 MG PO TABS: 40.0000 mg | ORAL_TABLET | Freq: Every day | ORAL | 3 refills | Status: DC

## 2023-04-11 NOTE — Progress Notes (Signed)
Patient had a fall in 01/2023.  The fall was sudden without dizziness or other symptoms.

## 2023-04-11 NOTE — Patient Instructions (Signed)
Medication Instructions:   START Atorvastatin - Take one tablet ( 40mg ) by mouth daily.  START Aspirin - Take one tablet ( 81mg ) by mouth daily.    *If you need a refill on your cardiac medications before your next appointment, please call your pharmacy*   Lab Work:  Your provider would like for you to return 1 week before your follow up appt to have the following labs drawn: LIPID.   Please go to [facility] [address] ([facility] [address], [place] [address] You do not need an appointment.  They are open from 7:30 am-4 pm.  Lunch from 1:00 pm- 2:00 pm You WILL need to be fasting.   If you have labs (blood work) drawn today and your tests are completely normal, you will receive your results only by: MyChart Message (if you have MyChart) OR A paper copy in the mail If you have any lab test that is abnormal or we need to change your treatment, we will call you to review the results.   Testing/Procedures:  Your physician has recommended that you wear a Zio monitor.   This monitor is a medical device that records the heart's electrical activity. Doctors most often use these monitors to diagnose arrhythmias. Arrhythmias are problems with the speed or rhythm of the heartbeat. The monitor is a small device applied to your chest. You can wear one while you do your normal daily activities. While wearing this monitor if you have any symptoms to push the button and record what you felt. Once you have worn this monitor for the period of time provider prescribed (Usually 14 days), you will return the monitor device in the postage paid box. Once it is returned they will download the data collected and provide [place] with a report which the provider will then review and we will call you with those results. Important tips:  Avoid showering during the first 24 hours of wearing the monitor. Avoid excessive sweating to help maximize wear time. Do not submerge the device, no hot  tubs, and no swimming pools. Keep any lotions or oils away from the patch. After 24 hours you may shower with the patch on. Take brief showers with your back facing the shower head.  Do not remove patch once it has been placed because that will interrupt data and decrease adhesive wear time. Push the button when you have any symptoms and write down what you were feeling. Once you have completed wearing your monitor, remove and place into box which has postage paid and place in your outgoing mailbox.  If for some reason you have misplaced your box then call our office and we can provide another box and/or mail it off for you.   Follow-Up: At [facility], you and your health needs are our priority.  As part of our continuing mission to provide you with exceptional heart care, we have created designated Provider Care Teams.  These Care Teams include your primary Cardiologist (physician) and Advanced Practice Providers (APPs -  Physician Assistants and Nurse Practitioners) who all work together to provide you with the care you need, when you need it.  We recommend signing up for the patient portal called "MyChart".  Sign up information is provided on this After Visit Summary.  MyChart is used to connect with patients for Virtual Visits (Telemedicine).  Patients are able to view lab/test results, encounter notes, upcoming appointments, etc.  Non-urgent messages can be sent to your provider as well.  To learn more about what you can do with MyChart, go to [email or web address].    Your next appointment:   2 month(s)  Provider:   You may see Debbe Odea, MD or one of the following Advanced Practice Providers on your designated Care Team:   Nicolasa Ducking, NP Eula Listen, PA-C Cadence Fransico Michael, PA-C Charlsie Quest, NP Carlos Levering, NP

## 2023-04-11 NOTE — Progress Notes (Signed)
Cardiology Office Note:    Date:  04/11/2023   ID:  Benjamin Mills, DOB [birth year], MRN [id number]  PCP:  Dana Allan, MD   [facility] Providers Cardiologist:  Debbe Odea, MD     Referring MD: Dana Allan, MD   Chief Complaint  Patient presents with   New Patient (Initial Visit)    Referred for cardiac evaluation of Syncope.  Patient had a fall in 01/2023.  The fall was sudden without dizziness or other symptoms.  Reports no further episodes.     Benjamin Mills is a 77 y.o. male who is being seen today for the evaluation of syncope at the request of Dana Allan, MD.   History of Present Illness:    Benjamin Mills is a 77 y.o. male with a hx of lumbar spinal stenosis, prostate cancer s/p prostatectomy 2016, ED secondary to prostatectomy, presenting with syncope.  He states standing on his doorway 2 months ago when he suddenly passed out.  He did not lose consciousness, denies chest pain, palpitations.  He just felt weak and fatigue prior to passing out.  His wife helped him up.  He sat for couple of minutes, symptoms resolved spontaneously.  Has not had any further episodes since.  Denies any prior episodes of syncope.  PCP obtained carotid ultrasound and echocardiogram.  Workup with carotid ultrasound 11/24 showed minimal calcified plaque of internal carotid artery origins bilaterally.  Echocardiogram 02/2023 EF 55 to 60%, impaired relaxation, mild aortic root dilatation 39 mm no significant structural abnormalities.  Past Medical History:  Diagnosis Date   Abnormal MRI, shoulder 04/08/2020   Adjustment disorder with mixed anxiety and depressed mood 08/06/2014   Amputation of finger, left    5th finger    Arthralgia 02/17/2018   Arthritis    hands, back lumbar sacral    Benign skin lesion 11/05/2015   Bilateral carotid artery stenosis 07/31/2019   Bilateral impacted cerumen 01/30/2019   Bunion of great toe of left foot 08/16/2017   Cancer (HCC)  2006   Prostate cancer with resection radical prostate removal. Urologist Dr. Vanna Scotland locally; prostate ca dx'ed in 2007 and 2015    Carotid artery stenosis    07/26/16 US carotid b/l 1-39%    Cataract    s/p surgery b/l Dr. Lindi [facility] 2018   Depression    Disorder of left rotator cuff 04/08/2020   Diverticulosis    Erectile dysfunction    Erectile dysfunction following radical prostatectomy 12/10/2014   Gastric ulcer    GERD (gastroesophageal reflux disease)    occ no meds   H/O cold sores 06/05/2014   Hepatic steatosis    History of kidney stones    h/o   History of prostate cancer 06/05/2014   History of splenectomy    Hyperlipidemia    Kidney stone on right side 02/17/2018   Left foot pain 10/17/2020   Nontraumatic complete tear of left rotator cuff 02/15/2020   Osteoarthritis of left shoulder 02/01/2020   Primary localized osteoarthrosis, hand 02/26/2019   Primary osteoarthritis of left knee 05/11/2019   Rupture of left biceps tendon 10/17/2020   Seborrheic keratoses 08/16/2017   Spinal stenosis    Spinal stenosis at L4-L5 level    Spinal stenosis at L4-L5 level 08/16/2017   Status post total knee replacement using cement, left 05/05/2020   SUI (stress urinary incontinence), male 12/10/2014   Tendinitis of upper biceps tendon of left shoulder 02/15/2020   Thoracic aortic  atherosclerosis (HCC)    Urinary stress incontinence, male    Wears dentures    full upper and lower    Past Surgical History:  Procedure Laterality Date   BUNIONECTOMY Left 06/16/2022   Procedure: BUNIONECTOMY Guinevere Scarlet;  Surgeon: Gwyneth Revels, DPM;  Location: [facility];  Service: Podiatry;  Laterality: Left;   COLONOSCOPY     HAMMER TOE SURGERY Left 06/16/2022   Procedure: HAMMER TOE CORRECTION;  Surgeon: Gwyneth Revels, DPM;  Location: [facility];  Service: Podiatry;  Laterality: Left;   INCONTINENCE SURGERY     KNEE ARTHROSCOPY N/A 1969   meniscal  tear. left    PROSTATECTOMY  2007   removal of prostate   REPAIR OF PERFORATED ULCER     2009   SPLENECTOMY  1953   after trauma, fell out tree as kid   TOTAL KNEE ARTHROPLASTY Left 04/29/2020   Procedure: TOTAL KNEE ARTHROPLASTY;  Surgeon: Christena Flake, MD;  Location: [facility];  Service: Orthopedics;  Laterality: Left;   WEIL OSTEOTOMY Left 06/16/2022   Procedure: WEIL OSTEOTOMY;  Surgeon: Gwyneth Revels, DPM;  Location: [facility];  Service: Podiatry;  Laterality: Left;    Current Medications: Current Meds  Medication Sig   aspirin EC 81 MG tablet Take 1 tablet (81 mg total) by mouth daily. Swallow whole.   atorvastatin (LIPITOR) 40 MG tablet Take 1 tablet (40 mg total) by mouth daily.   citalopram (CELEXA) 10 MG tablet Take 1 tablet by mouth once daily   DULoxetine (CYMBALTA) 60 MG capsule Take 1 capsule (60 mg total) by mouth 2 (two) times daily. (Patient taking differently: Take 60 mg by mouth daily.)   Multiple Vitamin (MULTIVITAMIN) capsule Take 1 capsule by mouth daily.   propranolol (INDERAL) 40 MG tablet Take 1 tablet (40 mg total) by mouth daily.   valACYclovir (VALTREX) 1000 MG tablet Take 1 tablet (1,000 mg total) by mouth 2 (two) times daily. X 3-7 days outbreak with food     Allergies:   Patient has no known allergies.   Social History   Socioeconomic History   Marital status: Married    Spouse name: Not on file   Number of children: Not on file   Years of education: Not on file   Highest education level: Not on file  Occupational History   Not on file  Tobacco Use   Smoking status: Former    Current packs/day: 0.00    Average packs/day: 1 pack/day for 7.0 years (7.0 ttl pk-yrs)    Types: Cigarettes    Start date: 06/06/1971    Quit date: 06/05/1978    Years since quitting: 44.8   Smokeless tobacco: Never   Tobacco comments:    quit in 1980s duration 6-7 YRS 1 ppd no FH lung cancer   Vaping Use   Vaping status: Never Used  Substance and Sexual  Activity   Alcohol use: Yes    Alcohol/week: 4.0 standard drinks of alcohol    Types: 4 Glasses of wine per week   Drug use: No   Sexual activity: Not Currently  Other Topics Concern   Not on file  Social History Narrative   Born in [place].   Lived in [place].    Moved to [facility], brother lives in [place]       Has 2 brothers and 1 sister.      Lives with wife in [place]. Has 3 daughters.      Work -  retired, Insurance risk surveyor      Diet - regular      Exercise - walks occasionally   Social Drivers of Health   Financial Resource Strain: Low Risk  (11/26/2022)   Overall Financial Resource Strain (CARDIA)    Difficulty of Paying Living Expenses: Not very hard  Food Insecurity: No Food Insecurity (11/26/2022)   Hunger Vital Sign    Worried About Running Out of Food in the Last Year: Never true    Ran Out of Food in the Last Year: Never true  Transportation Needs: No Transportation Needs (11/26/2022)   PRAPARE - Administrator, Civil Service (Medical): No    Lack of Transportation (Non-Medical): No  Physical Activity: Insufficiently Active (11/26/2022)   Exercise Vital Sign    Days of Exercise per Week: 3 days    Minutes of Exercise per Session: 40 min  Stress: Stress Concern Present (11/26/2022)   [employer] of Occupational Health - Occupational Stress Questionnaire    Feeling of Stress : To some extent  Social Connections: Unknown (11/26/2022)   Social Connection and Isolation Panel [NHANES]    Frequency of Communication with Friends and Family: Twice a week    Frequency of Social Gatherings with Friends and Family: Once a week    Attends Religious Services: Not on Marketing executive or Organizations: No    Attends Banker Meetings: Never    Marital Status: Married     Family History: The patient's family history includes Alcohol abuse in his father; Dementia in his mother; Glaucoma in his mother and sister; Heart  disease in his brother; Multiple sclerosis in his brother. There is no history of Kidney disease or Prostate cancer.  ROS:   Please see the history of present illness.     All other systems reviewed and are negative.  EKGs/Labs/Other Studies Reviewed:    The following studies were reviewed today:  EKG Interpretation Date/Time:  Monday April 11 2023 08:50:49 EST Ventricular Rate:  55 PR Interval:  166 QRS Duration:  142 QT Interval:  438 QTC Calculation: 419 R Axis:   -38  Text Interpretation: Sinus bradycardia Left axis deviation Right bundle branch block Confirmed by Debbe Odea ([id number]) on 04/11/2023 9:02:12 AM    Recent Labs: 01/23/2023: ALT 22; BUN 23; Creatinine, Ser 0.98; Hemoglobin 14.1; Platelets 253; Potassium 4.5; Sodium 135  Recent Lipid Panel    Component Value Date/Time   CHOL 242 (H) 12/25/2021 1112   CHOL 213 (H) 11/07/2018 0706   TRIG 97.0 12/25/2021 1112   HDL 68.40 12/25/2021 1112   HDL 69 11/07/2018 0706   CHOLHDL 4 12/25/2021 1112   VLDL 19.4 12/25/2021 1112   LDLCALC 154 (H) 12/25/2021 1112   LDLCALC 122 (H) 11/07/2018 0706     Risk Assessment/Calculations:             Physical Exam:    VS:  BP 128/80 (BP Location: Right Arm, Patient Position: Sitting)   Pulse (!) 55   Ht 5\' 8"  (1.727 m)   Wt 190 lb 12.8 oz (86.5 kg)   SpO2 99%   BMI 29.01 kg/m     Wt Readings from Last 3 Encounters:  04/11/23 190 lb 12.8 oz (86.5 kg)  01/25/23 191 lb 2 oz (86.7 kg)  01/23/23 194 lb 0.1 oz (88 kg)     GEN:  Well nourished, well developed in no acute distress HEENT: Normal NECK: No JVD; No carotid bruits  CARDIAC: RRR, no murmurs, rubs, gallops RESPIRATORY:  Clear to auscultation without rales, wheezing or rhonchi  ABDOMEN: Soft, non-tender, non-distended MUSCULOSKELETAL:  No edema; No deformity  SKIN: Warm and dry NEUROLOGIC:  Alert and oriented x 3 PSYCHIATRIC:  Normal affect   ASSESSMENT:    1. Syncope, unspecified syncope type    2. Pure hypercholesterolemia   3. Aortic root dilatation (HCC)    PLAN:    In order of problems listed above:  Syncope, etiology appears unclear, likely vasovagal.  Has not had any further episode since.  Echo showed normal EF, no significant structural abnormalities.  Carotid ultrasound showed no significant stenosis.  Place cardiac monitor to rule out any significant arrhythmias. Hyperlipidemia, minimal nonobstructive carotid atherosclerosis start aspirin 81 mg, Lipitor 40 mg daily, obtain lipid panel in 2 months. Mild aortic root dilatation, 39 mm, monitor serially with echoes.     Follow-up after cardiac testing  Medication Adjustments/Labs and Tests Ordered: Current medicines are reviewed at length with the patient today.  Concerns regarding medicines are outlined above.  Orders Placed This Encounter  Procedures   Lipid panel   LONG TERM MONITOR (3-14 DAYS)   EKG 12-Lead   Meds ordered this encounter  Medications   atorvastatin (LIPITOR) 40 MG tablet    Sig: Take 1 tablet (40 mg total) by mouth daily.    Dispense:  90 tablet    Refill:  3   aspirin EC 81 MG tablet    Sig: Take 1 tablet (81 mg total) by mouth daily. Swallow whole.    Patient Instructions  Medication Instructions:   START Atorvastatin - Take one tablet ( 40mg ) by mouth daily.  START Aspirin - Take one tablet ( 81mg ) by mouth daily.    *If you need a refill on your cardiac medications before your next appointment, please call your pharmacy*   Lab Work:  Your provider would like for you to return 1 week before your follow up appt to have the following labs drawn: LIPID.   Please go to [facility] [address] ([facility] [address], [place] [address] You do not need an appointment.  They are open from 7:30 am-4 pm.  Lunch from 1:00 pm- 2:00 pm You WILL need to be fasting.   If you have labs (blood work) drawn today and your tests are completely normal, you will receive  your results only by: MyChart Message (if you have MyChart) OR A paper copy in the mail If you have any lab test that is abnormal or we need to change your treatment, we will call you to review the results.   Testing/Procedures:  Your physician has recommended that you wear a Zio monitor.   This monitor is a medical device that records the heart's electrical activity. Doctors most often use these monitors to diagnose arrhythmias. Arrhythmias are problems with the speed or rhythm of the heartbeat. The monitor is a small device applied to your chest. You can wear one while you do your normal daily activities. While wearing this monitor if you have any symptoms to push the button and record what you felt. Once you have worn this monitor for the period of time provider prescribed (Usually 14 days), you will return the monitor device in the postage paid box. Once it is returned they will download the data collected and provide [place] with a report which the provider will then review and we will call you with those results. Important tips:  Avoid showering  during the first 24 hours of wearing the monitor. Avoid excessive sweating to help maximize wear time. Do not submerge the device, no hot tubs, and no swimming pools. Keep any lotions or oils away from the patch. After 24 hours you may shower with the patch on. Take brief showers with your back facing the shower head.  Do not remove patch once it has been placed because that will interrupt data and decrease adhesive wear time. Push the button when you have any symptoms and write down what you were feeling. Once you have completed wearing your monitor, remove and place into box which has postage paid and place in your outgoing mailbox.  If for some reason you have misplaced your box then call our office and we can provide another box and/or mail it off for you.   Follow-Up: At [facility], you and your health needs are our priority.  As part  of our continuing mission to provide you with exceptional heart care, we have created designated Provider Care Teams.  These Care Teams include your primary Cardiologist (physician) and Advanced Practice Providers (APPs -  Physician Assistants and Nurse Practitioners) who all work together to provide you with the care you need, when you need it.  We recommend signing up for the patient portal called "MyChart".  Sign up information is provided on this After Visit Summary.  MyChart is used to connect with patients for Virtual Visits (Telemedicine).  Patients are able to view lab/test results, encounter notes, upcoming appointments, etc.  Non-urgent messages can be sent to your provider as well.   To learn more about what you can do with MyChart, go to [email or web address].    Your next appointment:   2 month(s)  Provider:   You may see Debbe Odea, MD or one of the following Advanced Practice Providers on your designated Care Team:   Nicolasa Ducking, NP Eula Listen, PA-C Cadence Fransico Michael, PA-C Charlsie Quest, NP Carlos Levering, NP    Signed, Debbe Odea, MD  04/11/2023 10:01 AM    [facility]

## 2023-04-14 ENCOUNTER — Ambulatory Visit: Payer: PPO | Admitting: Family Medicine

## 2023-04-15 DIAGNOSIS — R55 Syncope and collapse: Secondary | ICD-10-CM

## 2023-04-19 ENCOUNTER — Ambulatory Visit: Payer: PPO | Admitting: Family Medicine

## 2023-04-26 ENCOUNTER — Encounter: Payer: Self-pay | Admitting: Family Medicine

## 2023-04-26 ENCOUNTER — Ambulatory Visit: Payer: PPO | Admitting: Family Medicine

## 2023-04-26 ENCOUNTER — Ambulatory Visit (INDEPENDENT_AMBULATORY_CARE_PROVIDER_SITE_OTHER): Payer: PPO

## 2023-04-26 VITALS — BP 130/70 | HR 72 | Temp 98.2°F | Resp 18 | Ht 68.0 in | Wt 187.1 lb

## 2023-04-26 DIAGNOSIS — F39 Unspecified mood [affective] disorder: Secondary | ICD-10-CM

## 2023-04-26 DIAGNOSIS — J22 Unspecified acute lower respiratory infection: Secondary | ICD-10-CM | POA: Diagnosis not present

## 2023-04-26 DIAGNOSIS — R55 Syncope and collapse: Secondary | ICD-10-CM | POA: Diagnosis not present

## 2023-04-26 DIAGNOSIS — G250 Essential tremor: Secondary | ICD-10-CM | POA: Diagnosis not present

## 2023-04-26 LAB — POCT INFLUENZA A/B
Influenza A, POC: NEGATIVE
Influenza B, POC: NEGATIVE

## 2023-04-26 LAB — CBC WITH DIFFERENTIAL/PLATELET
Basophils Absolute: 0.1 10*3/uL (ref 0.0–0.1)
Basophils Relative: 0.6 % (ref 0.0–3.0)
Eosinophils Absolute: 0.5 10*3/uL (ref 0.0–0.7)
Eosinophils Relative: 3.9 % (ref 0.0–5.0)
HCT: 36 % — ABNORMAL LOW (ref 39.0–52.0)
Hemoglobin: 11.5 g/dL — ABNORMAL LOW (ref 13.0–17.0)
Lymphocytes Relative: 11 % — ABNORMAL LOW (ref 12.0–46.0)
Lymphs Abs: 1.5 10*3/uL (ref 0.7–4.0)
MCHC: 32 g/dL (ref 30.0–36.0)
MCV: 94.3 fL (ref 78.0–100.0)
Monocytes Absolute: 1.2 10*3/uL — ABNORMAL HIGH (ref 0.1–1.0)
Monocytes Relative: 8.6 % (ref 3.0–12.0)
Neutro Abs: 10.3 10*3/uL — ABNORMAL HIGH (ref 1.4–7.7)
Neutrophils Relative %: 75.9 % (ref 43.0–77.0)
Platelets: 486 10*3/uL — ABNORMAL HIGH (ref 150.0–400.0)
RBC: 3.82 Mil/uL — ABNORMAL LOW (ref 4.22–5.81)
RDW: 13.3 % (ref 11.5–15.5)
WBC: 13.6 10*3/uL — ABNORMAL HIGH (ref 4.0–10.5)

## 2023-04-26 LAB — COMPREHENSIVE METABOLIC PANEL
ALT: 57 U/L — ABNORMAL HIGH (ref 0–53)
AST: 34 U/L (ref 0–37)
Albumin: 3.4 g/dL — ABNORMAL LOW (ref 3.5–5.2)
Alkaline Phosphatase: 168 U/L — ABNORMAL HIGH (ref 39–117)
BUN: 16 mg/dL (ref 6–23)
CO2: 29 meq/L (ref 19–32)
Calcium: 8.9 mg/dL (ref 8.4–10.5)
Chloride: 102 meq/L (ref 96–112)
Creatinine, Ser: 0.77 mg/dL (ref 0.40–1.50)
GFR: 86.06 mL/min (ref >60.00)
Glucose, Bld: 92 mg/dL (ref 70–99)
Potassium: 4.3 meq/L (ref 3.5–5.1)
Sodium: 140 meq/L (ref 135–145)
Total Bilirubin: 0.5 mg/dL (ref 0.2–1.2)
Total Protein: 6 g/dL (ref 6.0–8.3)

## 2023-04-26 LAB — POC COVID19 BINAXNOW: SARS Coronavirus 2 Ag: NEGATIVE

## 2023-04-26 MED ORDER — PROPRANOLOL HCL 40 MG PO TABS: 40.0000 mg | ORAL_TABLET | Freq: Every day | ORAL | 11 refills | Status: DC

## 2023-04-26 MED ORDER — DULOXETINE HCL 60 MG PO CPEP: 60.0000 mg | ORAL_CAPSULE | Freq: Two times a day (BID) | ORAL | 3 refills | Status: AC

## 2023-04-26 MED ORDER — DOXYCYCLINE HYCLATE 100 MG PO TABS
100.0000 mg | ORAL_TABLET | Freq: Two times a day (BID) | ORAL | 0 refills | Status: DC
Start: 2023-04-26 — End: 2023-05-01

## 2023-04-26 MED ORDER — SACCHAROMYCES BOULARDII 250 MG PO CAPS
250.0000 mg | ORAL_CAPSULE | Freq: Every day | ORAL | 0 refills | Status: AC
Start: 2023-04-26 — End: ?

## 2023-04-26 NOTE — Progress Notes (Signed)
 SUBJECTIVE:   Chief Complaint  Patient presents with   Cough    Dec 16 feels like chest is full of mucus   HPI Presents for acute visit  Discussed the use of AI scribe software for clinical note transcription with the patient, who gave verbal consent to proceed.  History of Present Illness The patient presents with a two-week history of persistent cough productive of green sputum, associated with intermittent fevers as high as 103 degrees Fahrenheit. The patient has been self-medicating with over-the-counter Nyquil and Dayquil every six hours, but reports no significant improvement in symptoms. The patient also reports increased thirst and dryness, leading to increased water intake, but decreased appetite. The cough is associated with chest discomfort, particularly in the back around the rib area, which is exacerbated by coughing. The patient denies any pain with normal breathing but avoids taking deep breaths due to discomfort. The patient also reports positional variation in the cough, which worsens when lying flat and upon standing from a supine position. The patient denies any nasal congestion or runny nose, but notes these symptoms seem to come and go. The patient's spouse was recently treated for pneumonia, but the patient denies any similar symptoms. The patient denies any sore throat. The patient also mentions wearing a heart monitor patch for an unspecified period as ordered by their cardiologist.      PERTINENT PMH / PSH: As above  OBJECTIVE:  BP 130/70   Pulse 72   Temp 98.2 F (36.8 C)   Resp 18   Ht 5' 8 (1.727 m)   Wt 187 lb 2 oz (84.9 kg)   SpO2 100%   BMI 28.45 kg/m    Physical Exam Vitals reviewed.  Constitutional:      General: He is not in acute distress.    Appearance: Normal appearance. He is normal weight. He is not ill-appearing, toxic-appearing or diaphoretic.  Eyes:     General:        Right eye: No discharge.        Left eye: No discharge.   Cardiovascular:     Rate and Rhythm: Normal rate and regular rhythm.     Heart sounds: Normal heart sounds.  Pulmonary:     Effort: Pulmonary effort is normal.     Breath sounds: Wheezing and rhonchi present.  Musculoskeletal:        General: Normal range of motion.     Cervical back: Normal range of motion.  Skin:    General: Skin is warm and dry.  Neurological:     Mental Status: He is alert and oriented to person, place, and time. Mental status is at baseline.  Psychiatric:        Mood and Affect: Mood normal.        Behavior: Behavior normal.        Thought Content: Thought content normal.        Judgment: Judgment normal.           01/25/2023    1:40 PM 11/26/2022    8:14 AM 10/12/2022    8:45 AM 06/15/2022    3:34 PM 06/02/2022   10:37 AM  Depression screen PHQ 2/9  Decreased Interest 0 0 0 1 1  Down, Depressed, Hopeless 0 1 0 1 0  PHQ - 2 Score 0 1 0 2 1  Altered sleeping 0 2 1 1 2   Tired, decreased energy 0 2 1 1 2   Change in appetite 0 0 0 0  1  Feeling bad or failure about yourself  0 0 0 1 1  Trouble concentrating 0 0 0 0 0  Moving slowly or fidgety/restless 0 0 0 0 0  Suicidal thoughts 0 0 0 0 0  PHQ-9 Score 0 5 2 5 7   Difficult doing work/chores Not difficult at all Somewhat difficult Not difficult at all Somewhat difficult Somewhat difficult      01/25/2023    1:40 PM 10/12/2022    8:46 AM 06/15/2022    3:35 PM 06/02/2022   10:38 AM  GAD 7 : Generalized Anxiety Score  Nervous, Anxious, on Edge 0 0 1 2  Control/stop worrying 0 0 0 1  Worry too much - different things 0 0 0 1  Trouble relaxing 0 0 0 1  Restless 0 0 1 0  Easily annoyed or irritable 0 1 1 3   Afraid - awful might happen 0 0 0 0  Total GAD 7 Score 0 1 3 8   Anxiety Difficulty Not difficult at all Not difficult at all Somewhat difficult Somewhat difficult    ASSESSMENT/PLAN:  LRTI (lower respiratory tract infection) Assessment & Plan: Persistent cough with green sputum, fever up to 103F,  and chest discomfort for two weeks. No improvement with over-the-counter medications. Possible exposure to pneumonia. Auscultation revealed abnormal lung sounds. Patient s/p spenectomy -Order chest X-ray. -Order blood work. -COVID and flu tests negative -Prescribe Albuterol  inhaler. -Start Doxycycline  100 mg BID -Advise to take over-the-counter probiotics during and 14 days post-antibiotic treatment.  Addendum Stop Doxycyline Start Z pack x 5 days Start Augmentin  BID x 5 days Labs significant for leukocytosis Plan for repeat labs in future once recovered.  Orders: -     POC COVID-19 BinaxNow -     POCT Influenza A/B -     DG Chest 2 View -     CBC with Differential/Platelet -     Comprehensive metabolic panel -     Saccharomyces boulardii; Take 1 capsule (250 mg total) by mouth daily.  Dispense: 90 capsule; Refill: 0  Essential tremor Assessment & Plan: Improved with BB Refill Propranolol   Orders: -     Propranolol  HCl; Take 1 tablet (40 mg total) by mouth daily.  Dispense: 30 tablet; Refill: 11  Mood disorder (HCC) Assessment & Plan: Doing well Refill Duloxetine   Orders: -     DULoxetine  HCl; Take 1 capsule (60 mg total) by mouth 2 (two) times daily.  Dispense: 180 capsule; Refill: 3  Syncope, unspecified syncope type Assessment & Plan: Patient currently wearing a heart monitor patch ordered by a cardiologist. -No action required at this time. Patient to follow up with cardiologist as planned.     PDMP reviewed  Return if symptoms worsen or fail to improve, for PCP.  Glenys Ferrari, MD

## 2023-04-26 NOTE — Patient Instructions (Signed)
 It was a pleasure meeting you today. Thank you for allowing me to take part in your health care.  Our goals for today as we discussed include:  We will get some labs today.  If they are abnormal or we need to do something about them, I will call you.  If they are normal, I will send you a message on MyChart (if it is active) or a letter in the mail.  If you don't hear from us  in 2 weeks, please call the office at the number below.   Xray today  Start Doxycycline  2 times a day for 5 days Start Probiotics daily and continue for 14 days after antibiotics completed  You can take Tylenol  and/or Ibuprofen as needed for fever reduction and pain relief.   For cough: honey 1/2 to 1 teaspoon (you can dilute the honey in water or another fluid).  You can also use guaifenesin  and dextromethorphan for cough. You can use a humidifier for chest congestion and cough.  If you don't have a humidifier, you can sit in the bathroom with the hot shower running.      For sore throat: try warm salt water gargles, cepacol lozenges, throat spray, warm tea or water with lemon/honey, popsicles or ice, or OTC cold relief medicine for throat discomfort.   It is important to stay hydrated: drink plenty of fluids (water, gatorade/powerade/pedialyte, juices, or teas) to keep your throat moisturized and help further relieve irritation/discomfort.   Refills sent for requested medications   If you have any questions or concerns, please do not hesitate to call the office at [phone].  I look forward to our next visit and until then take care and stay safe.  Regards,   Glenys Ferrari, MD   [facility]

## 2023-04-28 ENCOUNTER — Other Ambulatory Visit: Payer: Self-pay | Admitting: Family Medicine

## 2023-04-28 DIAGNOSIS — D72829 Elevated white blood cell count, unspecified: Secondary | ICD-10-CM

## 2023-04-28 DIAGNOSIS — J22 Unspecified acute lower respiratory infection: Secondary | ICD-10-CM

## 2023-04-28 DIAGNOSIS — R7989 Other specified abnormal findings of blood chemistry: Secondary | ICD-10-CM

## 2023-04-28 MED ORDER — AZITHROMYCIN 250 MG PO TABS
ORAL_TABLET | ORAL | 0 refills | Status: AC
Start: 2023-04-28 — End: 2023-05-03

## 2023-04-28 MED ORDER — AMOXICILLIN-POT CLAVULANATE 875-125 MG PO TABS
1.0000 | ORAL_TABLET | Freq: Two times a day (BID) | ORAL | 0 refills | Status: AC
Start: 2023-04-28 — End: ?

## 2023-05-03 ENCOUNTER — Encounter: Payer: Self-pay | Admitting: Family Medicine

## 2023-05-03 DIAGNOSIS — J22 Unspecified acute lower respiratory infection: Secondary | ICD-10-CM | POA: Insufficient documentation

## 2023-05-03 NOTE — Assessment & Plan Note (Addendum)
Improved with BB Refill Propranolol

## 2023-05-03 NOTE — Assessment & Plan Note (Addendum)
Doing well Refill Duloxetine

## 2023-05-03 NOTE — Assessment & Plan Note (Signed)
Patient currently wearing a heart monitor patch ordered by a cardiologist. -No action required at this time. Patient to follow up with cardiologist as planned.

## 2023-05-03 NOTE — Assessment & Plan Note (Signed)
Persistent cough with green sputum, fever up to 103F, and chest discomfort for two weeks. No improvement with over-the-counter medications. Possible exposure to pneumonia. Auscultation revealed abnormal lung sounds. Patient s/p spenectomy -Order chest X-ray. -Order blood work. -COVID and flu tests negative -Prescribe Albuterol inhaler. -Start Doxycycline 100 mg BID -Advise to take over-the-counter probiotics during and 14 days post-antibiotic treatment.  Addendum Stop Doxycyline Start Z pack x 5 days Start Augmentin BID x 5 days Labs significant for leukocytosis Plan for repeat labs in future once recovered.

## 2023-05-06 ENCOUNTER — Encounter: Payer: Self-pay | Admitting: Cardiology

## 2023-05-12 ENCOUNTER — Other Ambulatory Visit: Payer: Self-pay | Admitting: Family Medicine

## 2023-05-12 DIAGNOSIS — F39 Unspecified mood [affective] disorder: Secondary | ICD-10-CM

## 2023-05-31 ENCOUNTER — Other Ambulatory Visit: Payer: Self-pay | Admitting: Emergency Medicine

## 2023-05-31 DIAGNOSIS — E7800 Pure hypercholesterolemia, unspecified: Secondary | ICD-10-CM

## 2023-06-01 LAB — LIPID PANEL
Chol/HDL Ratio: 2.5 {ratio} (ref 0.0–5.0)
Cholesterol, Total: 122 mg/dL (ref 100–199)
HDL: 48 mg/dL (ref >39)
LDL Chol Calc (NIH): 60 mg/dL (ref 0–99)
Triglycerides: 67 mg/dL (ref 0–149)
VLDL Cholesterol Cal: 14 mg/dL (ref 5–40)

## 2023-06-01 NOTE — Progress Notes (Signed)
 Cardiology Clinic Note   Date: 06/06/2023 ID: Benjamin Mills, DOB Aug 08, 1945, MRN [id number]  Primary Cardiologist:  Debbe Odea, MD  Chief Complaint   Benjamin Mills is a 78 y.o. male who presents to the clinic today for follow up after testing.   Patient Profile   Benjamin Mills is followed by Dr. Azucena Cecil for the history outlined below.      Past medical history significant for: Coronary calcification/aortic atherosclerosis seen on CT. CT chest 01/23/2023: Aortic atherosclerosis.  Left main and two-vessel CAD. Syncope. Echo 02/11/2023: EF 55 to 60%.  No RWMA.  Grade I DD.  Normal RV function.  Mild RVH.  Mild MR.  Mild dilatation of aortic root 39 mm. Carotid ultrasound 02/11/2023: Minimal calcified plaque of the internal carotid artery origins bilaterally with Doppler measurements consistent with <50% stenosis. 14-day ZIO 05/05/2023: HR 44 to 152 bpm, average 67 bpm.  Predominantly sinus rhythm.  BBB/IVCD present.  4 runs of SVT with fastest 9 beats max rate 152 bpm, longest 9 beats average rate 99 bpm.  Some episodes of SVT may be possible A. tach with variable block.  Rare ectopy.  No sustained arrhythmias.  No A-fib/a-flutter.  No findings to suggest etiology of syncope. Hyperlipidemia. Lipid panel 05/31/2023: LDL 60, HDL 48, TG 67, total 122. Prostate cancer. S/p prostatectomy 2016. Splenectomy.  In summary, patient was evaluated in the emergency department on 01/23/2023 for shortness of breath, back pain and syncope.  Patient reported the evening prior he had a syncopal episode at home striking his head.  Patient reported he felt like his legs were going to give out at he fell.  He did not lose consciousness.  Patient was also evaluated for complaints of dyspnea and lightheaded spells with unremarkable EKG and labs and negative troponin x 2.  D-dimer was elevated.  Patient had negative venous ultrasound at the end of September.  CTA negative for PE.  CT of head,  cervical spine, chest were without acute findings other than probable subacute compression type fracture of superior endplate of T12.  Follow-up CT of thoracic and lumbar spine showed acute fracture involving anterior aspect of the T12 superior endplate with loss of 20 to 30% of superior endplate height.  Patient was placed in TLSO brace and discharged to follow-up with PCP and neurosurgery.    Patient was first evaluated by Dr. Azucena Cecil on 04/11/2023 for syncope at the request of Dr. Clent Ridges.  Patient denied further episodes of syncope since October.  He he had already undergone workup with echo and carotid ultrasound as detailed above.  14-day ZIO with no findings to suggest etiology of syncope (further details above).     History of Present Illness    Today, patient is here alone. He reports he is doing well. Patient denies shortness of breath, dyspnea on exertion, lower extremity edema, orthopnea or PND. No chest pain, pressure, or tightness. No palpitations.  No lightheadedness, dizziness, or near syncope. He has not had another syncopal event since October. He is the primary caregiver for his wife. He does light to moderate household activities.    ROS: All other systems reviewed and are otherwise negative except as noted in History of Present Illness.  EKGs/Labs Reviewed        EKG not ordered today.   04/26/2023: ALT 57; AST 34; BUN 16; Creatinine, Ser 0.77; Potassium 4.3; Sodium 140   04/26/2023: Hemoglobin 11.5; WBC 13.6    Physical Exam    VS:  BP 129/76   Pulse (!) 53   Ht 5\' 8"  (1.727 m)   Wt 189 lb 9.6 oz (86 kg)   SpO2 98%   BMI 28.83 kg/m  , BMI Body mass index is 28.83 kg/m.  GEN: Well nourished, well developed, in no acute distress. Neck: No JVD or carotid bruits. Cardiac:  RRR. No murmurs. No rubs or gallops.   Respiratory:  Respirations regular and unlabored. Clear to auscultation without rales, wheezing or rhonchi. GI: Soft, nontender,  nondistended. Extremities: Radials/DP/PT 2+ and equal bilaterally. No clubbing or cyanosis. No edema.  Skin: Warm and dry, no rash. Neuro: Strength intact.  Assessment & Plan   Coronary calcification/aortic atherosclerosis seen on CT CT chest October 2024 showed aortic atherosclerosis and left main/two-vessel CAD.  Patient denies chest pain, pressure or tightness. He is primary caregiver for his wife. He performs light to moderate household activities.  -Continue aspirin, atorvastatin.  Hyperlipidemia LDL February 2025 60, at goal. -Continue atorvastatin.  Syncope Echo showed normal LV/RV function, no RWMA, Grade I DD, mild dilatation of aortic root 39 mm.  Carotid ultrasound with minimal calcified plaque.  14-day ZIO with no findings to suggest etiology of syncope (further details under narrative history).  Patient denies further episodes of syncope. No lightheadedness, dizziness or near-syncope.  -No further workup indicated.  Disposition: Return as needed.          Signed, Etta Grandchild. Axzel Rockhill, DNP, NP-C

## 2023-06-06 ENCOUNTER — Ambulatory Visit: Payer: PPO | Attending: Student | Admitting: Student

## 2023-06-06 ENCOUNTER — Encounter: Payer: Self-pay | Admitting: Student

## 2023-06-06 VITALS — BP 129/76 | HR 53 | Ht 68.0 in | Wt 189.6 lb

## 2023-06-06 DIAGNOSIS — R55 Syncope and collapse: Secondary | ICD-10-CM

## 2023-06-06 DIAGNOSIS — I2510 Atherosclerotic heart disease of native coronary artery without angina pectoris: Secondary | ICD-10-CM

## 2023-06-06 DIAGNOSIS — E785 Hyperlipidemia, unspecified: Secondary | ICD-10-CM | POA: Diagnosis not present

## 2023-06-06 DIAGNOSIS — I700 Atherosclerosis of aorta: Secondary | ICD-10-CM

## 2023-06-06 NOTE — Patient Instructions (Signed)
Medication Instructions:  No changes at this time.   *If you need a refill on your cardiac medications before your next appointment, please call your pharmacy*   Lab Work: None  If you have labs (blood work) drawn today and your tests are completely normal, you will receive your results only by: MyChart Message (if you have MyChart) OR A paper copy in the mail If you have any lab test that is abnormal or we need to change your treatment, we will call you to review the results.   Testing/Procedures: None   Follow-Up: At [facility], you and your health needs are our priority.  As part of our continuing mission to provide you with exceptional heart care, we have created designated Provider Care Teams.  These Care Teams include your primary Cardiologist (physician) and Advanced Practice Providers (APPs -  Physician Assistants and Nurse Practitioners) who all work together to provide you with the care you need, when you need it.   Your next appointment:    Follow up as needed 

## 2023-09-28 ENCOUNTER — Other Ambulatory Visit: Payer: Self-pay

## 2023-09-28 DIAGNOSIS — B009 Herpesviral infection, unspecified: Secondary | ICD-10-CM

## 2023-09-28 NOTE — Telephone Encounter (Signed)
 Left message to return call to our office.  Need to know how he is taking Valtrex ? Did he request the refill?

## 2023-09-29 MED ORDER — VALACYCLOVIR HCL 1 G PO TABS: 1000.0000 mg | ORAL_TABLET | Freq: Two times a day (BID) | ORAL | 3 refills | Status: AC

## 2023-10-04 ENCOUNTER — Telehealth: Payer: Self-pay | Admitting: Family Medicine

## 2023-10-04 NOTE — Telephone Encounter (Signed)
 LMTCB

## 2023-10-04 NOTE — Telephone Encounter (Signed)
 Copied from CRM [id number]. Topic: Appointments - Scheduling Inquiry for Clinic >> Oct 04, 2023  2:26 PM Benjamin Mills wrote: Reason for CRM: Patient and Wife Benjamin Mills would like to know if Dr.Tullo could make an exception for them by taking them as new patient, patient was a former patient of Dr.Walsh, and first soonest appointment will not be until January , would like to receive a callback regarding this answer on this

## 2023-10-05 ENCOUNTER — Telehealth: Payer: Self-pay | Admitting: Family Medicine

## 2023-10-05 NOTE — Telephone Encounter (Signed)
 Patient last seen in office: 04/26/2023 MyChart message sent about calling office to schedule TOC: 07/19/2023 @ 9:25 am. Telephone note yesterday asking if Dr Marylynn would take patient. Reply from provider: unable to take patient at this time. (See note 10/04/2023)   Copied from CRM [id number]. Topic: Complaint (DO NOT CONVERT) - Provider (sensitive) >> Oct 05, 2023 10:25 AM Essie A wrote: Date of Incident: 10/05/23 Details of complaint: Dr. Hope who was his previous doctor left, and you need a new provider How would the patient like to see it resolved? He would like to speak to the Engineer, manufacturing. On a scale of 1-10, how was your experience? N/A What would it take to bring it to a 10? N/A  Route to Research officer, political party.

## 2023-10-05 NOTE — Telephone Encounter (Signed)
 Pt is aware per [facility]

## 2023-10-06 NOTE — Telephone Encounter (Unsigned)
 Copied from CRM [id number]. Topic: General - Other >> Oct 06, 2023  4:35 PM Delon T wrote: Reason for CRM: returning call to [facility]  [phone]

## 2023-10-06 NOTE — Telephone Encounter (Signed)
 Left message for patient to return my call.

## 2023-10-07 NOTE — Telephone Encounter (Signed)
 Patient was advised he was told he could not be seen in this office until January when he could establish with provider excepting new patients. I advised patient that  was a miss understanding that he could be seen acutely by any provider in clinic with availability. That to establish appt were out to January. I was able to schedule patient to be seen next week for neck pain.

## 2023-10-10 ENCOUNTER — Telehealth: Payer: Self-pay

## 2023-10-10 NOTE — Telephone Encounter (Signed)
 I left a voicemail requesting a call back to schedule a TOC appointment.  E2C2 - when patient calls back, please assist him with scheduling a transfer of care appointment, as Dr. Glenys Ferrari is leaving our practice.

## 2023-10-11 ENCOUNTER — Encounter: Payer: Self-pay | Admitting: Nurse Practitioner

## 2023-10-11 ENCOUNTER — Ambulatory Visit (INDEPENDENT_AMBULATORY_CARE_PROVIDER_SITE_OTHER): Admitting: Nurse Practitioner

## 2023-10-11 VITALS — BP 118/78 | HR 75 | Temp 97.5°F | Ht 68.0 in | Wt 190.2 lb

## 2023-10-11 DIAGNOSIS — M436 Torticollis: Secondary | ICD-10-CM | POA: Diagnosis not present

## 2023-10-11 MED ORDER — CYCLOBENZAPRINE HCL 5 MG PO TABS: 5.0000 mg | ORAL_TABLET | Freq: Every day | ORAL | 0 refills | Status: AC

## 2023-10-11 NOTE — Patient Instructions (Addendum)
 Today, we discussed your ongoing neck pain and muscle stiffness, which you have been experiencing for the past few months.  Chronic neck pain with muscle stiffness and grinding sensation, possibly due to teeth grinding or arthritis. Symptoms suggest muscle tension and potential temporomandibular joint disorder. -Take the prescribed muscle relaxant (5 mg) at bedtime. Avoid driving or operating heavy machinery after taking it. -Use warm compresses and a heating pad to relieve stiffness. -Start massage and stretching exercises. -Schedule a dental evaluation to check your bite and consider using a mouth guard. -Consider over-the-counter anti-grinding devices if you cannot see a dentist soon. -Report back if there is no improvement.

## 2023-10-11 NOTE — Progress Notes (Signed)
 Established Patient Office Visit  Subjective:  Patient ID: Benjamin Mills, male    DOB: [birth year]  Age: 78 y.o. MRN: [id number]  CC:  Chief Complaint  Patient presents with   Acute Visit    Neck & jaw pain getting worse over past month   Discussed the use of a AI scribe software for clinical note transcription with the patient, who gave verbal consent to proceed.  HPI  mes V Cass is a 78 year old male with a history of cervical arthritis who presents with neck pain and muscle stiffness.  He has experienced neck pain and muscle stiffness for two and a half to three months, with pain intensity ranging from five to seven out of ten. The pain originates in the neck and affects muscle movement. This pain differs from previous cervical arthritis pain. Neck pain began three months after receiving new lower dentures two years ago.   He had a fall in October 2024 resulted in a compression fracture and persistent right shoulder pain during activities like gardening.  He has not tried specific treatments for the current pain, which has worsened over the past few months.  HPI   Past Medical History:  Diagnosis Date   Abnormal MRI, shoulder 04/08/2020   Adjustment disorder with mixed anxiety and depressed mood 08/06/2014   Amputation of finger, left    5th finger    Arthralgia 02/17/2018   Arthritis    hands, back lumbar sacral    Benign skin lesion 11/05/2015   Bilateral carotid artery stenosis 07/31/2019   Bilateral impacted cerumen 01/30/2019   Bunion of great toe of left foot 08/16/2017   Cancer (HCC) 2006   Prostate cancer with resection radical prostate removal. Urologist Dr. Rosina Riis locally; prostate ca dx'ed in 2007 and 2015    Carotid artery stenosis    07/26/16 US  carotid b/l 1-39%    Cataract    s/p surgery b/l Dr. Elsp[facility] 2018   Depression    Disorder of left rotator cuff 04/08/2020   Diverticulosis    Erectile dysfunction    Erectile  dysfunction following radical prostatectomy 12/10/2014   Gastric ulcer    GERD (gastroesophageal reflux disease)    occ no meds   H/O cold sores 06/05/2014   Hepatic steatosis    History of kidney stones    h/o   History of prostate cancer 06/05/2014   History of splenectomy    Hyperlipidemia    Kidney stone on right side 02/17/2018   Left foot pain 10/17/2020   Nontraumatic complete tear of left rotator cuff 02/15/2020   Osteoarthritis of left shoulder 02/01/2020   Primary localized osteoarthrosis, hand 02/26/2019   Primary osteoarthritis of left knee 05/11/2019   Rupture of left biceps tendon 10/17/2020   Seborrheic keratoses 08/16/2017   Spinal stenosis    Spinal stenosis at L4-L5 level    Spinal stenosis at L4-L5 level 08/16/2017   Status post total knee replacement using cement, left 05/05/2020   SUI (stress urinary incontinence), male 12/10/2014   Tendinitis of upper biceps tendon of left shoulder 02/15/2020   Thoracic aortic atherosclerosis (HCC)    Urinary stress incontinence, male    Wears dentures    full upper and lower    Past Surgical History:  Procedure Laterality Date   BUNIONECTOMY Left 06/16/2022   Procedure: BUNIONECTOMY GLENWOOD MASSIE GLATTER;  Surgeon: Ashley Soulier, DPM;  Location: [facility];  Service: Podiatry;  Laterality: Left;   COLONOSCOPY  HAMMER TOE SURGERY Left 06/16/2022   Procedure: HAMMER TOE CORRECTION;  Surgeon: Ashley Soulier, DPM;  Location: [facility];  Service: Podiatry;  Laterality: Left;   INCONTINENCE SURGERY     KNEE ARTHROSCOPY N/A 1969   meniscal tear. left    PROSTATECTOMY  2007   removal of prostate   REPAIR OF PERFORATED ULCER     2009   SPLENECTOMY  1953   after trauma, fell out tree as kid   TOTAL KNEE ARTHROPLASTY Left 04/29/2020   Procedure: TOTAL KNEE ARTHROPLASTY;  Surgeon: Edie Norleen PARAS, MD;  Location: [facility];  Service: Orthopedics;  Laterality: Left;   WEIL OSTEOTOMY Left 06/16/2022   Procedure: WEIL  OSTEOTOMY;  Surgeon: Ashley Soulier, DPM;  Location: [facility];  Service: Podiatry;  Laterality: Left;    Family History  Problem Relation Age of Onset   Dementia Mother    Glaucoma Mother    Alcohol abuse Father    Glaucoma Sister    Heart disease Brother        heart valve issue   Multiple sclerosis Brother    Kidney disease Neg Hx    Prostate cancer Neg Hx     Social History   Socioeconomic History   Marital status: Married    Spouse name: Not on file   Number of children: Not on file   Years of education: Not on file   Highest education level: Not on file  Occupational History   Not on file  Tobacco Use   Smoking status: Former    Current packs/day: 0.00    Average packs/day: 1 pack/day for 7.0 years (7.0 ttl pk-yrs)    Types: Cigarettes    Start date: 06/06/1971    Quit date: 06/05/1978    Years since quitting: 45.4   Smokeless tobacco: Never   Tobacco comments:    quit in 1980s duration 6-7 YRS 1 ppd no FH lung cancer   Vaping Use   Vaping status: Never Used  Substance and Sexual Activity   Alcohol use: Yes    Alcohol/week: 4.0 standard drinks of alcohol    Types: 4 Glasses of wine per week   Drug use: No   Sexual activity: Not Currently  Other Topics Concern   Not on file  Social History Narrative   Born in [place] .   Lived in [place] .    Moved to [facility], brother lives in [place] another [place]        Has 2 brothers and 1 sister.      Lives with wife in [place]. Has 3 daughters.      Work - retired, Insurance risk surveyor      Diet - regular      Exercise - walks occasionally   Social Drivers of Corporate investment banker Strain: Medium Risk (04/17/2023)   Overall Financial Resource Strain (CARDIA)    Difficulty of Paying Living Expenses: Somewhat hard  Food Insecurity: No Food Insecurity (04/17/2023)   Hunger Vital Sign    Worried About Running Out of Food in the Last Year: Never true    Ran Out of Food in the Last Year: Never true   Transportation Needs: No Transportation Needs (04/17/2023)   PRAPARE - Administrator, Civil Service (Medical): No    Lack of Transportation (Non-Medical): No  Physical Activity: Insufficiently Active (04/17/2023)   Exercise Vital Sign    Days of Exercise per Week: 2 days    Minutes of Exercise per Session: 40  min  Stress: No Stress Concern Present (04/17/2023)   [employer] of Occupational Health - Occupational Stress Questionnaire    Feeling of Stress : Not at all  Social Connections: Socially Isolated (04/17/2023)   Social Connection and Isolation Panel    Frequency of Communication with Friends and Family: Once a week    Frequency of Social Gatherings with Friends and Family: Once a week    Attends Religious Services: Never    Database administrator or Organizations: No    Attends Banker Meetings: Never    Marital Status: Married  Catering manager Violence: Not At Risk (11/26/2022)   Humiliation, Afraid, Rape, and Kick questionnaire    Fear of Current or Ex-Partner: No    Emotionally Abused: No    Physically Abused: No    Sexually Abused: No     Outpatient Medications Prior to Visit  Medication Sig Dispense Refill   aspirin  EC 81 MG tablet Take 1 tablet (81 mg total) by mouth daily. Swallow whole.     atorvastatin  (LIPITOR) 40 MG tablet Take 1 tablet (40 mg total) by mouth daily. 90 tablet 3   citalopram  (CELEXA ) 10 MG tablet Take 1 tablet by mouth once daily 90 tablet 3   DULoxetine  (CYMBALTA ) 60 MG capsule Take 1 capsule (60 mg total) by mouth 2 (two) times daily. 180 capsule 3   Multiple Vitamin (MULTIVITAMIN) capsule Take 1 capsule by mouth daily.     propranolol  (INDERAL ) 40 MG tablet Take 1 tablet (40 mg total) by mouth daily. 30 tablet 11   saccharomyces boulardii (FLORASTOR) 250 MG capsule Take 1 capsule (250 mg total) by mouth daily. 90 capsule 0   valACYclovir  (VALTREX ) 1000 MG tablet Take 1 tablet (1,000 mg total) by mouth 2 (two) times  daily. X 3-7 days outbreak with food 90 tablet 3   amoxicillin -clavulanate (AUGMENTIN ) 875-125 MG tablet Take 1 tablet by mouth 2 (two) times daily. (Patient not taking: Reported on 06/06/2023) 20 tablet 0   No facility-administered medications prior to visit.    No Known Allergies  ROS Review of Systems Negative unless indicated in HPI.    Objective:    Physical Exam Constitutional:      Appearance: Normal appearance.  Cardiovascular:     Rate and Rhythm: Normal rate and regular rhythm.     Pulses: Normal pulses.     Heart sounds: Normal heart sounds.  Pulmonary:     Effort: Pulmonary effort is normal.     Breath sounds: Normal breath sounds.  Abdominal:     Tenderness: There is no left CVA tenderness.  Musculoskeletal:        General: Tenderness (trapezius muscle tenderness and neck stiffness) present.     Cervical back: Normal range of motion.  Neurological:     General: No focal deficit present.     Mental Status: He is alert. Mental status is at baseline.  Psychiatric:        Mood and Affect: Mood normal.        Behavior: Behavior normal.        Thought Content: Thought content normal.        Judgment: Judgment normal.     BP 118/78   Pulse 75   Temp (!) 97.5 F (36.4 C)   Ht 5' 8 (1.727 m)   Wt 190 lb 3.2 oz (86.3 kg)   SpO2 96%   BMI 28.92 kg/m  Wt Readings from Last 3 Encounters:  10/11/23 190  lb 3.2 oz (86.3 kg)  06/06/23 189 lb 9.6 oz (86 kg)  04/26/23 187 lb 2 oz (84.9 kg)     Health Maintenance  Topic Date Due   Zoster Vaccines- Shingrix (1 of 2) Never done   DTaP/Tdap/Td (2 - Td or Tdap) 04/13/2023   Medicare Annual Wellness (AWV)  11/26/2023   COVID-19 Vaccine (4 - Pfizer risk 2024-25 season) 10/26/2023 (Originally 07/18/2023)   INFLUENZA VACCINE  11/11/2023   Pneumococcal Vaccine: 50+ Years  Completed   Hepatitis C Screening  Completed   Hepatitis B Vaccines  Aged Out   HPV VACCINES  Aged Out   Meningococcal B Vaccine  Aged Out    Colonoscopy  Discontinued    There are no preventive care reminders to display for this patient.  Lab Results  Component Value Date   TSH 0.41 12/25/2021   Lab Results  Component Value Date   WBC 13.6 (H) 04/26/2023   HGB 11.5 (L) 04/26/2023   HCT 36.0 (L) 04/26/2023   MCV 94.3 04/26/2023   PLT 486.0 (H) 04/26/2023   Lab Results  Component Value Date   NA 140 04/26/2023   K 4.3 04/26/2023   CO2 29 04/26/2023   GLUCOSE 92 04/26/2023   BUN 16 04/26/2023   CREATININE 0.77 04/26/2023   BILITOT 0.5 04/26/2023   ALKPHOS 168 (H) 04/26/2023   AST 34 04/26/2023   ALT 57 (H) 04/26/2023   PROT 6.0 04/26/2023   ALBUMIN 3.4 (L) 04/26/2023   CALCIUM  8.9 04/26/2023   ANIONGAP 9 01/23/2023   GFR 86.06 04/26/2023   Lab Results  Component Value Date   CHOL 122 05/31/2023   Lab Results  Component Value Date   HDL 48 05/31/2023   Lab Results  Component Value Date   LDLCALC 60 05/31/2023   Lab Results  Component Value Date   TRIG 67 05/31/2023   Lab Results  Component Value Date   CHOLHDL 2.5 05/31/2023   Lab Results  Component Value Date   HGBA1C 5.8 12/25/2021      Assessment & Plan:  Stiffness of neck Assessment & Plan: Chronic neck pain with muscle stiffness and pt reports that he grind his teeth. Symptoms worsen in past few months. Have not tried anything. -Will prescribe flexril 5 mg daily at bed time. - Recommended warm compresses and heating pad for stiffness relief. - Encouraged dental evaluation for potential mouth guard use. - Advised over-the-counter anti-grinding devices if dental visit delayed. - Instructed to report if no improvement after a week, consider orthopedic referral.   Other orders -     Cyclobenzaprine  HCl; Take 1 tablet (5 mg total) by mouth at bedtime.  Dispense: 20 tablet; Refill: 0    Follow-up: No follow-ups on file.   Navika Hoopes, NP

## 2023-10-26 DIAGNOSIS — M436 Torticollis: Secondary | ICD-10-CM | POA: Insufficient documentation

## 2023-10-26 NOTE — Assessment & Plan Note (Signed)
 Chronic neck pain with muscle stiffness and pt reports that he grind his teeth. Symptoms worsen in past few months. Have not tried anything. -Will prescribe flexril 5 mg daily at bed time. - Recommended warm compresses and heating pad for stiffness relief. - Encouraged dental evaluation for potential mouth guard use. - Advised over-the-counter anti-grinding devices if dental visit delayed. - Instructed to report if no improvement after a week, consider orthopedic referral.

## 2023-11-24 ENCOUNTER — Encounter: Payer: Self-pay | Admitting: Urology

## 2023-11-29 ENCOUNTER — Ambulatory Visit: Payer: PPO

## 2023-12-05 NOTE — Assessment & Plan Note (Deleted)
 S/p lap RP in 2007 Gleason 3+4, pT2cNxMx  Salvage XRT in 2017 Undetectable PSAs (last in 2024)   Overall doing very well. Will continue annual PSAs

## 2023-12-05 NOTE — Progress Notes (Deleted)
   12/19/2023 2:21 PM   Benjamin Mills 1945/11/18 [id number]  Reason for visit: Follow up prostate Ca   HPI: Initial follow up with me today, previously followed by Dr. Penne for a hx of CaP.   Prior HPI: Last seen by Dr. Penne in Sept 2024 Hx of post-RP SUI, s/p MUS placement in 2014 Hx of AMH - plan to recheck UA in 2025  Prostate Ca hx: Lap radical prostatectomy in 2007 - Gleason 3+4, pT2cNxMx  BCR to 0.2 s/p salvage XRT in 2017 + 54yr ADT PSA undetectable, last in 2024  Physical Exam: There were no vitals taken for this visit.   Constitutional:  Alert and oriented, No acute distress.  GU: ***  Laboratory Data: No pertinent lab data to review at this visit***  Pertinent Imaging: I have personally viewed and interpreted the ***.   Assessment & Plan:    History of prostate cancer Assessment & Plan: S/p lap RP in 2007 Gleason 3+4, pT2cNxMx  Salvage XRT in 2017 Undetectable PSAs (last in 2024)   Overall doing very well. Will continue annual PSAs        Penne JONELLE Skye, MD  [facility] [address], [address] [place], [place] [address] [phone]

## 2023-12-16 ENCOUNTER — Other Ambulatory Visit: Payer: Self-pay

## 2023-12-19 ENCOUNTER — Ambulatory Visit: Admitting: Urology

## 2023-12-19 DIAGNOSIS — Z8546 Personal history of malignant neoplasm of prostate: Secondary | ICD-10-CM

## 2023-12-20 ENCOUNTER — Ambulatory Visit: Payer: Self-pay | Admitting: Urology

## 2024-01-10 ENCOUNTER — Telehealth: Payer: Self-pay

## 2024-01-10 NOTE — Telephone Encounter (Signed)
 Copied from CRM [id number]. Topic: Clinical - Request for Lab/Test Order >> Jan 10, 2024 10:13 AM Pinkey ORN wrote: Reason for CRM: X-ray >> Jan 10, 2024 10:14 AM Pinkey ORN wrote: Patient is requesting to have x-ray imaging completed on his neck. Please follow up with patient.

## 2024-01-13 NOTE — Telephone Encounter (Signed)
 Pt has Multilevel disc space narrowing and endplate spurring noted at C4-5, C5-6, C6-7 and C7-T1 from CT cervical spine 01/2023. If symptoms are not improving I would suggest ortho referral over X-ray. Does he has any preference?

## 2024-01-13 NOTE — Telephone Encounter (Signed)
 Patient would like a x ray order put in for our office then he will call on Monday and schedule it. Patient would also like to know if it is time for lab work? If so he would like to go ahead and get those labs.

## 2024-01-24 ENCOUNTER — Telehealth: Payer: Self-pay | Admitting: Nurse Practitioner

## 2024-01-24 NOTE — Telephone Encounter (Signed)
 error

## 2024-01-24 NOTE — Telephone Encounter (Signed)
 Pt need TOC.

## 2024-02-10 ENCOUNTER — Other Ambulatory Visit: Payer: Self-pay

## 2024-02-10 ENCOUNTER — Telehealth: Payer: Self-pay

## 2024-02-10 DIAGNOSIS — M436 Torticollis: Secondary | ICD-10-CM

## 2024-02-10 NOTE — Telephone Encounter (Signed)
 Okay to send referral to ortho.

## 2024-02-10 NOTE — Telephone Encounter (Signed)
 Copied from CRM [id number]. Topic: Referral - Request for Referral >> Feb 10, 2024 11:26 AM Rea ORN wrote: Did the patient discuss referral with their provider in the last year? Yes (If No - schedule appointment) (If Yes - send message)  Appointment offered? No  Type of order/referral and detailed reason for visit: Pt discussed ortho referral at 7/1 appt and would like to move forward with this. Pt misunderstood and thought he needed an xray but notes stated he needs ortho referral. Pt feels like it is getting worse and more stiff.  Preference of office, provider, location: Pt would like NP to select office.   If referral order, have you been seen by this specialty before? No (If Yes, this issue or another issue? When? Where?  Can we respond through MyChart? Yes

## 2024-02-10 NOTE — Telephone Encounter (Signed)
Referral placed pt has been notified

## 2024-03-14 ENCOUNTER — Ambulatory Visit: Admitting: Family

## 2024-03-17 ENCOUNTER — Other Ambulatory Visit: Payer: Self-pay | Admitting: Cardiology

## 2024-03-26 ENCOUNTER — Encounter: Payer: Self-pay | Admitting: Internal Medicine

## 2024-03-26 ENCOUNTER — Ambulatory Visit: Admitting: Internal Medicine

## 2024-03-26 VITALS — BP 124/80 | HR 72 | Temp 97.2°F | Ht 68.0 in | Wt 192.6 lb

## 2024-03-26 DIAGNOSIS — D649 Anemia, unspecified: Secondary | ICD-10-CM | POA: Diagnosis not present

## 2024-03-26 DIAGNOSIS — F411 Generalized anxiety disorder: Secondary | ICD-10-CM

## 2024-03-26 DIAGNOSIS — M436 Torticollis: Secondary | ICD-10-CM | POA: Diagnosis not present

## 2024-03-26 DIAGNOSIS — R7989 Other specified abnormal findings of blood chemistry: Secondary | ICD-10-CM | POA: Diagnosis not present

## 2024-03-26 DIAGNOSIS — R5383 Other fatigue: Secondary | ICD-10-CM | POA: Insufficient documentation

## 2024-03-26 LAB — CBC WITH DIFFERENTIAL/PLATELET
Basophils Absolute: 0 K/uL (ref 0.0–0.1)
Basophils Relative: 0.5 % (ref 0.0–3.0)
Eosinophils Absolute: 0.3 K/uL (ref 0.0–0.7)
Eosinophils Relative: 3.8 % (ref 0.0–5.0)
HCT: 40.7 % (ref 39.0–52.0)
Hemoglobin: 13.5 g/dL (ref 13.0–17.0)
Lymphocytes Relative: 17.5 % (ref 12.0–46.0)
Lymphs Abs: 1.3 K/uL (ref 0.7–4.0)
MCHC: 33.1 g/dL (ref 30.0–36.0)
MCV: 93.8 fl (ref 78.0–100.0)
Monocytes Absolute: 0.6 K/uL (ref 0.1–1.0)
Monocytes Relative: 8.5 % (ref 3.0–12.0)
Neutro Abs: 5 K/uL (ref 1.4–7.7)
Neutrophils Relative %: 69.7 % (ref 43.0–77.0)
Platelets: 240 K/uL (ref 150.0–400.0)
RBC: 4.34 Mil/uL (ref 4.22–5.81)
RDW: 13.8 % (ref 11.5–15.5)
WBC: 7.2 K/uL (ref 4.0–10.5)

## 2024-03-26 LAB — COMPREHENSIVE METABOLIC PANEL WITH GFR
ALT: 23 U/L (ref 0–53)
AST: 24 U/L (ref 0–37)
Albumin: 4.2 g/dL (ref 3.5–5.2)
Alkaline Phosphatase: 105 U/L (ref 39–117)
BUN: 24 mg/dL — ABNORMAL HIGH (ref 6–23)
CO2: 32 meq/L (ref 19–32)
Calcium: 9.7 mg/dL (ref 8.4–10.5)
Chloride: 103 meq/L (ref 96–112)
Creatinine, Ser: 0.89 mg/dL (ref 0.40–1.50)
GFR: 81.85 mL/min (ref >60.00)
Glucose, Bld: 115 mg/dL — ABNORMAL HIGH (ref 70–99)
Potassium: 4.7 meq/L (ref 3.5–5.1)
Sodium: 139 meq/L (ref 135–145)
Total Bilirubin: 0.7 mg/dL (ref 0.2–1.2)
Total Protein: 6.5 g/dL (ref 6.0–8.3)

## 2024-03-26 LAB — VITAMIN D 25 HYDROXY (VIT D DEFICIENCY, FRACTURES): VITD: 41.82 ng/mL (ref 30.00–100.00)

## 2024-03-26 LAB — TSH: TSH: 0.48 u[IU]/mL (ref 0.35–5.50)

## 2024-03-26 LAB — VITAMIN B12: Vitamin B-12: 823 pg/mL (ref 211–911)

## 2024-03-26 MED ORDER — METHOCARBAMOL 500 MG PO TABS: 500.0000 mg | ORAL_TABLET | Freq: Three times a day (TID) | ORAL | 0 refills | Status: AC | PRN

## 2024-03-26 NOTE — Assessment & Plan Note (Signed)
-   Patient complains of increased fatigue over the last few months and difficulty going up a hill to get the mail or take the trash out -Etiology behind his fatigue remains uncertain.  He does complain of intermittent decreased appetite but his weight has remained stable -Will check CBC, TSH, vitamin B12, vitamin D  levels -No further workup at this time

## 2024-03-26 NOTE — Assessment & Plan Note (Signed)
-   Patient has history of generalized anxiety disorder and complains of increased anxiety currently.  States that he feels anxious all the time -He is on Cymbalta  60 mg twice daily.  His prior PCP had also added on Celexa  10 mg daily to see if this would help with the symptoms -His GAD-7 score is 0 today but patient states that he feels anxious -He has been taking THC Gummies at home to help with his anxiety symptoms -Patient feels that Celexa  has been ineffective.  Will stop this medication for him given that it can increase his risk for serotonin syndrome in combination with Cymbalta  -Continue with Cymbalta  60 mg twice daily for now -Patient open to referral to psychiatry for further evaluation -Will refer patient to psychiatry today -No further workup at this time

## 2024-03-26 NOTE — Assessment & Plan Note (Signed)
-   Patient was noted to have normocytic anemia on lab work done in January with a hemoglobin of 11.5 -He now complains of increased fatigue and shortness of breath when walking up a hill to get the mail or to take the garbage out -Will recheck CBC today for further evaluation -His last colonoscopy was in 2023.  He denies any melena or hematochezia -No further workup at this time

## 2024-03-26 NOTE — Assessment & Plan Note (Signed)
-   This problem is chronic and worsening -Patient states that he has had significant pain over his cervical spine for the last few months that has been progressively worsening -He does have associated pain in his trapezius muscles and occipital region of his scalp -He did try Flexeril  in the past with uncertain response -He did follow-up with orthopedics and had cervical x-rays done which showed arthritis but has not followed up with them since -On exam, patient did have mild tenderness as well as tightness over his trapezius muscles and occipital region of the scalp.  No cervical spine tenderness noted on exam -Will prescribe Robaxin  to see if this will help with the associated muscle pain -Encouraged patient to follow-up with orthopedics to see what further evaluation they would recommend including possible imaging (MRI) -Patient will follow-up with [facility]

## 2024-03-26 NOTE — Progress Notes (Signed)
 Acute Office Visit  Subjective:     Patient ID: Benjamin Mills, male    DOB: [birth year], 78 y.o.   MRN: [id number]  Chief Complaint  Patient presents with   Acute Visit    Neck pain  Little headache all the time Pain 7-8/10  Bilateral thumb and hip pain  Gets out of breath walking up hill to get mail and take the garbage out   Discussed the use of AI scribe software for clinical note transcription with the patient, who gave verbal consent to proceed.  History of Present Illness Benjamin Mills is a 78 year old male who presents with worsening neck pain and fatigue.  Neck pain and arthralgia - Progressive neck pain worsening over the past couple of years -Over the last week, patient complains of pain over his occipital region as well as his shoulders - Cervical x-ray revealed arthritis - Pain radiates to head and upper shoulders at times - Associated pain in hands, with spreading symptoms - Flexeril  previously trialed without relief  Fatigue and constitutional symptoms - Increased fatigue recently - History of mildly low blood counts in January 2025 - Intermittent loss of appetite - Weight stable at 192 pounds  Anxiety and psychiatric symptoms - Anxiety managed with THC gummies, three per day - Cymbalta  60 mg currently taken for anxiety - Uncertain about taking Celexa , which was prescribed - No prior follow-up with psychiatry or therapy due to pride - Currently open to seeing a therapist - Anxiety exacerbated by wife's disability    Review of Systems  Constitutional:  Positive for malaise/fatigue. Negative for weight loss.  HENT: Negative.    Respiratory: Negative.    Cardiovascular: Negative.   Gastrointestinal: Negative.   Musculoskeletal:  Positive for joint pain and neck pain.  Neurological: Negative.   Psychiatric/Behavioral:  The patient is nervous/anxious.         Objective:    BP 124/80   Pulse 72   Temp (!) 97.2 F (36.2 C)   Ht 5' 8  (1.727 m)   Wt 192 lb 9.6 oz (87.4 kg)   SpO2 98%   BMI 29.28 kg/m    Physical Exam Constitutional:      Appearance: Normal appearance.  HENT:     Head: Normocephalic and atraumatic.  Cardiovascular:     Rate and Rhythm: Normal rate and regular rhythm.     Heart sounds: Normal heart sounds.  Pulmonary:     Effort: Pulmonary effort is normal.     Breath sounds: Normal breath sounds. No wheezing, rhonchi or rales.  Abdominal:     General: Bowel sounds are normal. There is no distension.     Palpations: Abdomen is soft.     Tenderness: There is no abdominal tenderness. There is no guarding or rebound.  Musculoskeletal:        General: Tenderness present. No swelling.     Right lower leg: No edema.     Left lower leg: No edema.     Comments: Mild tenderness to palpation over bilateral shoulders with tightness noted over his right trapezius.  Patient also with some mild tenderness over his occipital region.  Neurological:     Mental Status: He is alert.  Psychiatric:        Mood and Affect: Mood normal.        Behavior: Behavior normal.     No results found for any visits on 03/26/24.      Assessment & Plan:  Problem List Items Addressed This Visit       Other   Abnormal LFTs (liver function tests)   - Patient was noted to have mildly elevated alk phos levels of 168 as well as a mildly elevated ALT of 57 on lab work done in January of this year -He was scheduled for repeat blood work in March or April but has not had this done -We will recheck his LFTs today -If they remain elevated would consider further evaluation with a right upper quadrant ultrasound      Relevant Orders   Comprehensive metabolic panel with GFR   Anemia   - Patient was noted to have normocytic anemia on lab work done in January with a hemoglobin of 11.5 -He now complains of increased fatigue and shortness of breath when walking up a hill to get the mail or to take the garbage out -Will recheck  CBC today for further evaluation -His last colonoscopy was in 2023.  He denies any melena or hematochezia -No further workup at this time      Relevant Orders   CBC with Differential/Platelet   Fatigue - Primary   - Patient complains of increased fatigue over the last few months and difficulty going up a hill to get the mail or take the trash out -Etiology behind his fatigue remains uncertain.  He does complain of intermittent decreased appetite but his weight has remained stable -Will check CBC, TSH, vitamin B12, vitamin D  levels -No further workup at this time      Relevant Orders   Vitamin B12   VITAMIN D  25 Hydroxy (Vit-D Deficiency, Fractures)   Comprehensive metabolic panel with GFR   TSH   GAD (generalized anxiety disorder)   - Patient has history of generalized anxiety disorder and complains of increased anxiety currently.  States that he feels anxious all the time -He is on Cymbalta  60 mg twice daily.  His prior PCP had also added on Celexa  10 mg daily to see if this would help with the symptoms -His GAD-7 score is 0 today but patient states that he feels anxious -He has been taking THC Gummies at home to help with his anxiety symptoms -Patient feels that Celexa  has been ineffective.  Will stop this medication for him given that it can increase his risk for serotonin syndrome in combination with Cymbalta  -Continue with Cymbalta  60 mg twice daily for now -Patient open to referral to psychiatry for further evaluation -Will refer patient to psychiatry today -No further workup at this time      Relevant Orders   Ambulatory referral to Psychiatry   Stiffness of neck   - This problem is chronic and worsening -Patient states that he has had significant pain over his cervical spine for the last few months that has been progressively worsening -He does have associated pain in his trapezius muscles and occipital region of his scalp -He did try Flexeril  in the past with uncertain  response -He did follow-up with orthopedics and had cervical x-rays done which showed arthritis but has not followed up with them since -On exam, patient did have mild tenderness as well as tightness over his trapezius muscles and occipital region of the scalp.  No cervical spine tenderness noted on exam -Will prescribe Robaxin  to see if this will help with the associated muscle pain -Encouraged patient to follow-up with orthopedics to see what further evaluation they would recommend including possible imaging (MRI) -Patient will follow-up with [facility]  Relevant Medications   methocarbamol  (ROBAXIN ) 500 MG tablet    Meds ordered this encounter  Medications   methocarbamol  (ROBAXIN ) 500 MG tablet    Sig: Take 1 tablet (500 mg total) by mouth every 8 (eight) hours as needed for muscle spasms.    Dispense:  30 tablet    Refill:  0    No follow-ups on file.  Jacilyn Sanpedro, MD

## 2024-03-26 NOTE — Assessment & Plan Note (Signed)
-   Patient was noted to have mildly elevated alk phos levels of 168 as well as a mildly elevated ALT of 57 on lab work done in January of this year -He was scheduled for repeat blood work in March or April but has not had this done -We will recheck his LFTs today -If they remain elevated would consider further evaluation with a right upper quadrant ultrasound

## 2024-03-26 NOTE — Patient Instructions (Addendum)
°  VISIT SUMMARY: Today, you were seen for worsening neck pain, fatigue, and anxiety. We discussed your symptoms, reviewed your recent x-ray results, and made plans for further evaluation and treatment.  YOUR PLAN: -CERVICAL OSTEOARTHRITIS WITH CERVICALGIA: You have arthritis in your neck, which is causing chronic pain that sometimes spreads to your head and shoulders. We will arrange for you to see an orthopedic specialist for an MRI of your neck. In the meantime, you have been prescribed Robaxin  to help with the muscle pain.  -FATIGUE AND UNINTENTIONAL WEIGHT LOSS, POSSIBLE ANEMIA: You have been experiencing increased fatigue and some unintentional weight loss. This could be due to several reasons, including anemia or vitamin deficiencies. We have ordered blood tests to check your blood counts, vitamin B12 levels, thyroid  function, kidney function, and liver function.  -GENERALIZED ANXIETY DISORDER AND DEPRESSIVE SYMPTOMS: You have chronic anxiety and depressive symptoms. You are currently using THC gummies and Cymbalta  for anxiety. We have decided to discontinue Celexa  as it was not effective and could be harmful when combined with Cymbalta . You have been referred to a psychiatrist for further management and therapy.  -GENERAL HEALTH MAINTENANCE: Your last colonoscopy was over a year ago and was indicated as the last one needed. We will review your blood work results to determine if any further investigation is necessary.  INSTRUCTIONS: Please follow up with the orthopedic specialist for your MRI as soon as it is scheduled. Make sure to complete the blood tests we have ordered. Continue taking Cymbalta  as prescribed and discontinue Celexa . Follow up with the psychiatrist for further management of your anxiety and depressive symptoms.                      Contains text generated by [employer]bridge.                                 Contains text generated by  [employer]bridge.

## 2024-03-28 ENCOUNTER — Ambulatory Visit: Payer: Self-pay | Admitting: Internal Medicine

## 2024-05-11 ENCOUNTER — Other Ambulatory Visit: Payer: Self-pay | Admitting: Nurse Practitioner

## 2024-05-11 ENCOUNTER — Telehealth: Payer: Self-pay

## 2024-05-11 DIAGNOSIS — G250 Essential tremor: Secondary | ICD-10-CM

## 2024-05-11 MED ORDER — PROPRANOLOL HCL 40 MG PO TABS: 40.0000 mg | ORAL_TABLET | Freq: Every day | ORAL | 11 refills | Status: AC

## 2024-05-11 NOTE — Telephone Encounter (Signed)
 Copied from CRM #[id number]. Topic: Clinical - Medication Refill >> May 11, 2024  3:58 PM Sasha M wrote: Medication: propranolol  (INDERAL ) 40 MG tablet  Has the patient contacted their pharmacy? Yes (Agent: If no, request that the patient contact the pharmacy for the refill. If patient does not wish to contact the pharmacy document the reason why and proceed with request.) (Agent: If yes, when and what did the pharmacy advise?)  This is the patient's preferred pharmacy:  [facility] [address], [place] - [address] [place] [address] Phone: [phone] Fax: [phone]  Is this the correct pharmacy for this prescription? Yes If no, delete pharmacy and type the correct one.   Has the prescription been filled recently? No  Is the patient out of the medication? No  Has the patient been seen for an appointment in the last year OR does the patient have an upcoming appointment? Yes  Can we respond through MyChart? No, phone call preferred  Agent: Please be advised that Rx refills may take up to 3 business days. We ask that you follow-up with your pharmacy.

## 2024-05-11 NOTE — Telephone Encounter (Signed)
 Medication has been sent.

## 2024-05-15 NOTE — Telephone Encounter (Signed)
 Patient is aware the medication was called in and is thankful.

## 2024-05-25 ENCOUNTER — Encounter: Admitting: Nurse Practitioner
